# Patient Record
Sex: Female | Born: 1975
Health system: Southern US, Community
[De-identification: ages and names within clinical notes are randomized; demographics above are authoritative.]

## PROBLEM LIST (undated history)

## (undated) DIAGNOSIS — E739 Lactose intolerance, unspecified: Secondary | ICD-10-CM

## (undated) DIAGNOSIS — R51 Headache: Secondary | ICD-10-CM

## (undated) DIAGNOSIS — R87619 Unspecified abnormal cytological findings in specimens from cervix uteri: Secondary | ICD-10-CM

## (undated) DIAGNOSIS — M5430 Sciatica, unspecified side: Secondary | ICD-10-CM

## (undated) DIAGNOSIS — D219 Benign neoplasm of connective and other soft tissue, unspecified: Secondary | ICD-10-CM

## (undated) DIAGNOSIS — Z8669 Personal history of other diseases of the nervous system and sense organs: Secondary | ICD-10-CM

## (undated) DIAGNOSIS — R011 Cardiac murmur, unspecified: Secondary | ICD-10-CM

## (undated) DIAGNOSIS — IMO0002 Reserved for concepts with insufficient information to code with codable children: Secondary | ICD-10-CM

## (undated) DIAGNOSIS — K219 Gastro-esophageal reflux disease without esophagitis: Secondary | ICD-10-CM

## (undated) DIAGNOSIS — F419 Anxiety disorder, unspecified: Secondary | ICD-10-CM

## (undated) HISTORY — DX: Anxiety disorder, unspecified: F41.9

## (undated) HISTORY — PX: NO PAST SURGERIES: SHX2092

---
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
## 2009-12-25 ENCOUNTER — Inpatient Hospital Stay (HOSPITAL_COMMUNITY): Admission: AD | Admit: 2009-12-25 | Discharge: 2009-12-25 | Payer: Self-pay | Admitting: Family Medicine

## 2009-12-25 ENCOUNTER — Ambulatory Visit: Payer: Self-pay | Admitting: Advanced Practice Midwife

## 2009-12-25 IMAGING — US US OB COMP LESS 14 WK
1 series · 14 of 28 positions shown · non-contrast
Comparison: none

OBSTETRICAL ULTRASOUND:
 This ultrasound exam was performed in the [HOSPITAL] Ultrasound Department.  The OB US report was generated in the AS system, and faxed to the ordering physician.  This report is also available in [HOSPITAL]?s Access[employer]ware and in [REDACTED] PACS.

[Series 1: us ob comp less 14 wks · 58 acquisitions, 14 frames shown]
[im 3/58]
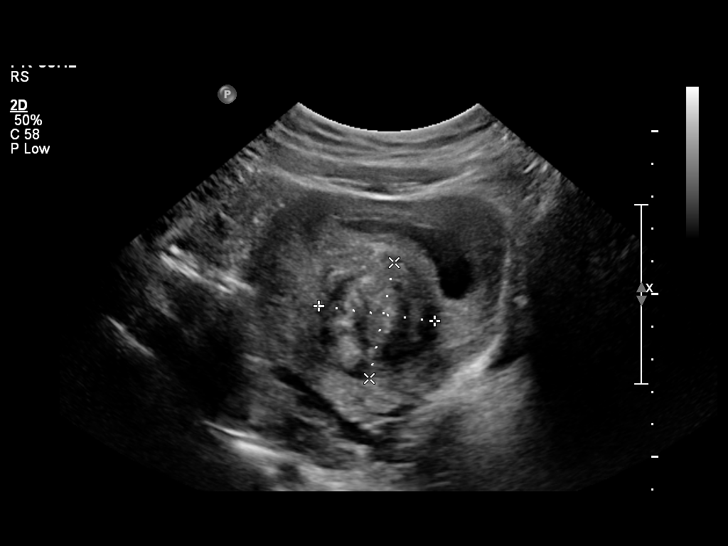
[im 7/58]
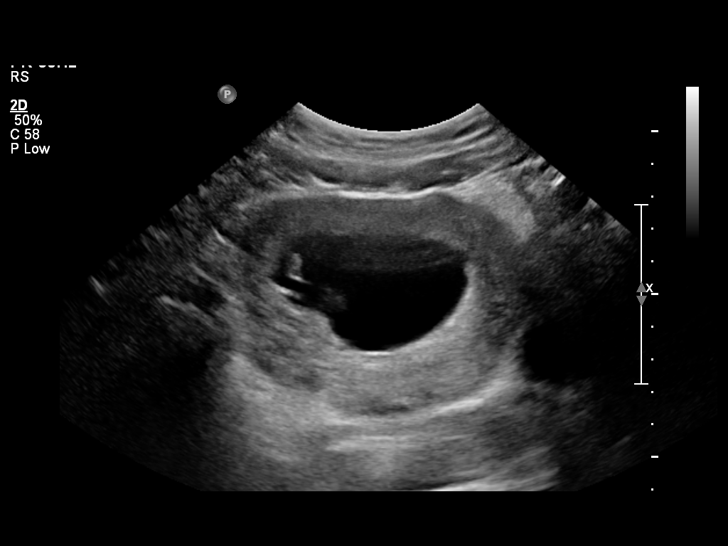
[im 11/58]
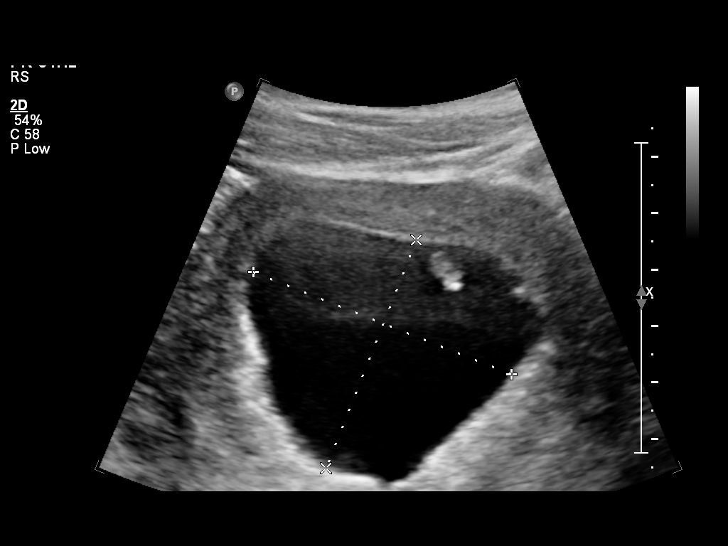
[im 15/58]
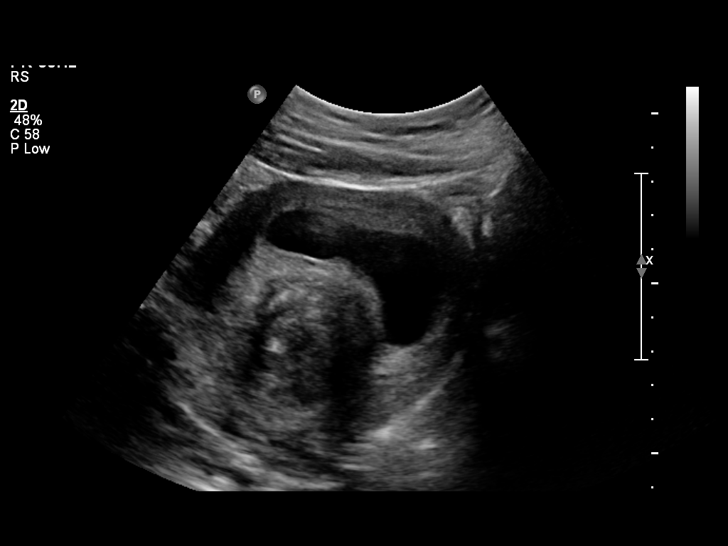
[im 20/58]
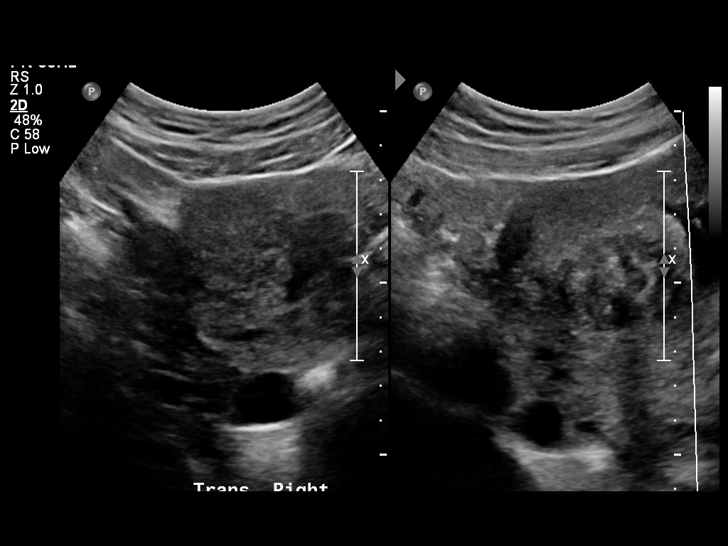
[im 24/58]
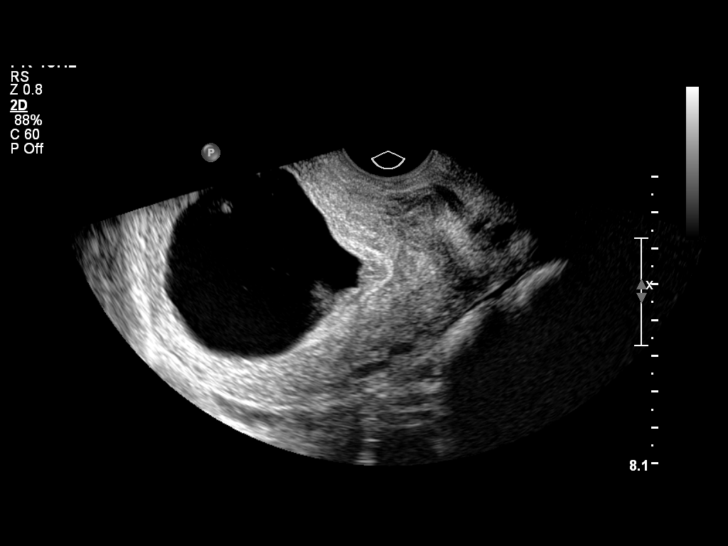
[im 28/58]
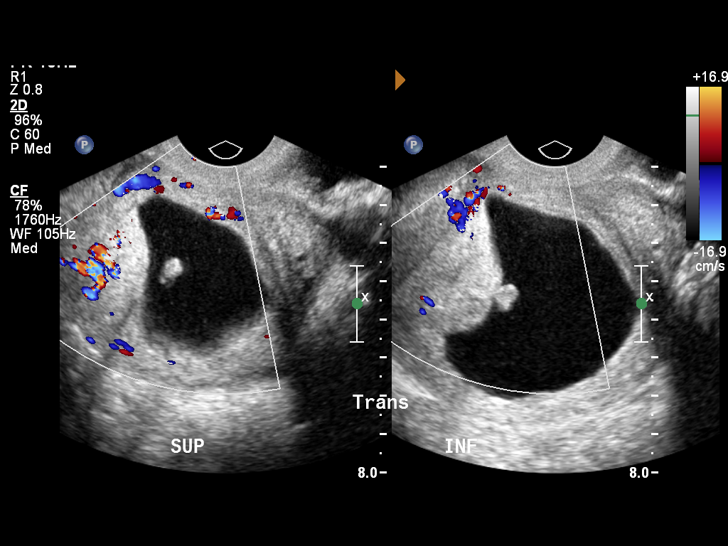
[im 32/58]
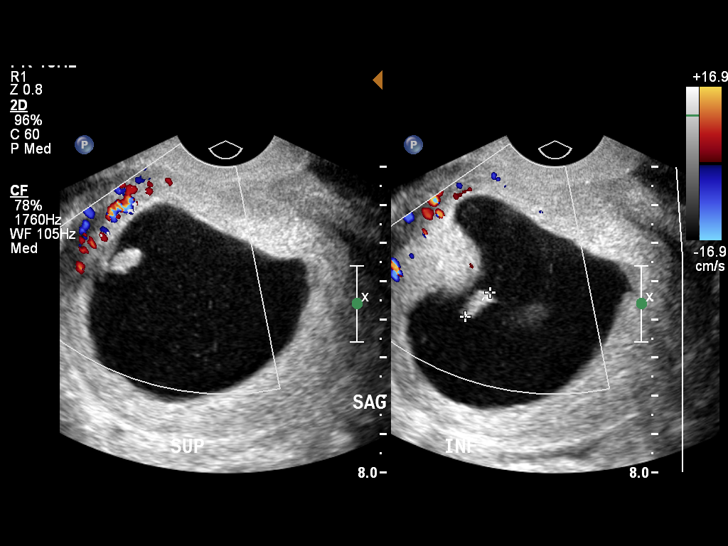
[im 36/58]
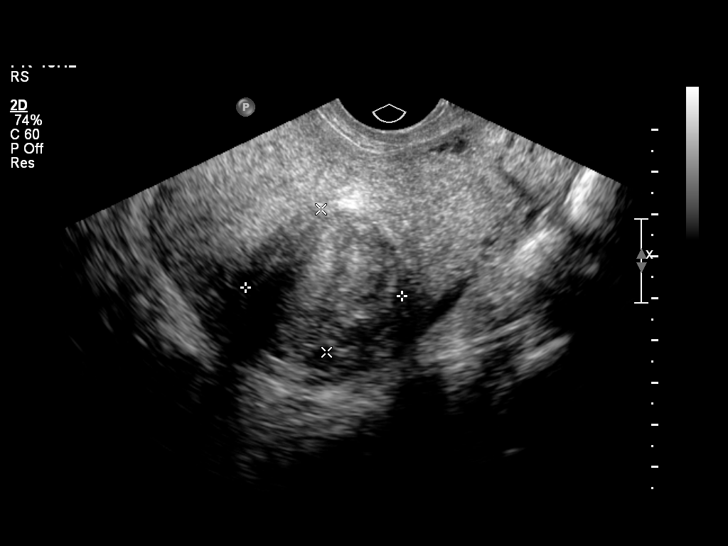
[im 41/58]
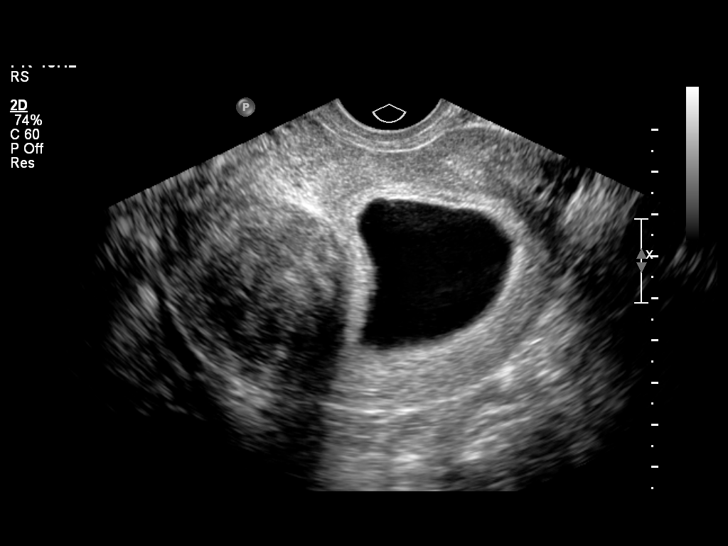
[im 45/58]
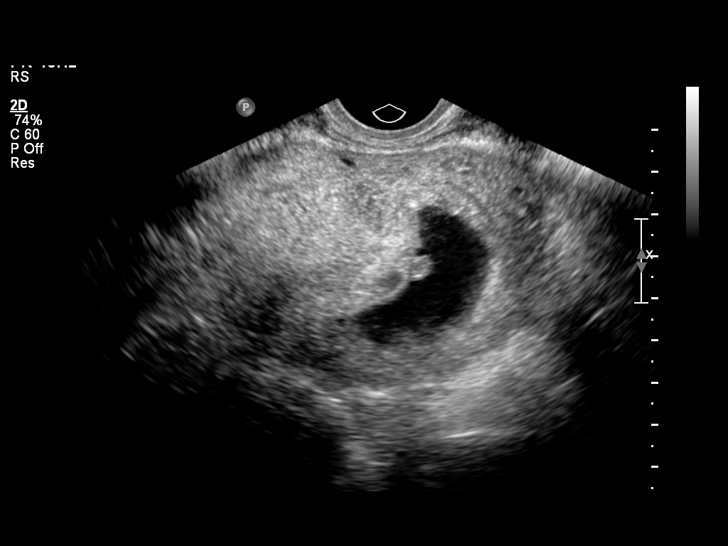
[im 49/58]
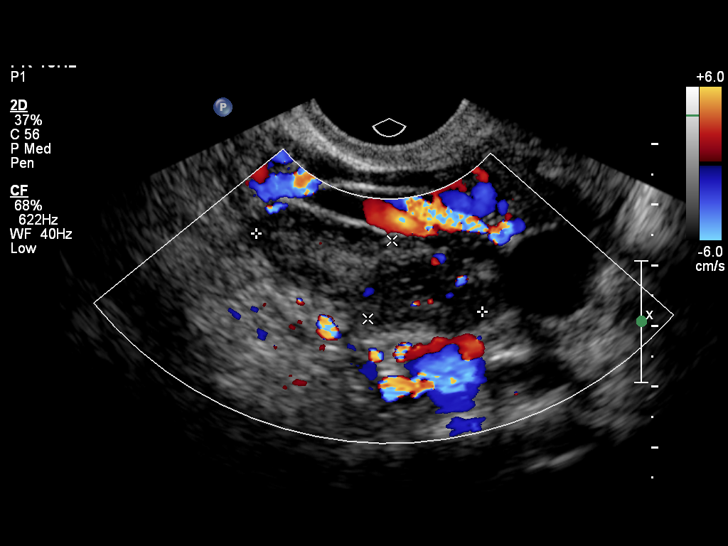
[im 53/58]
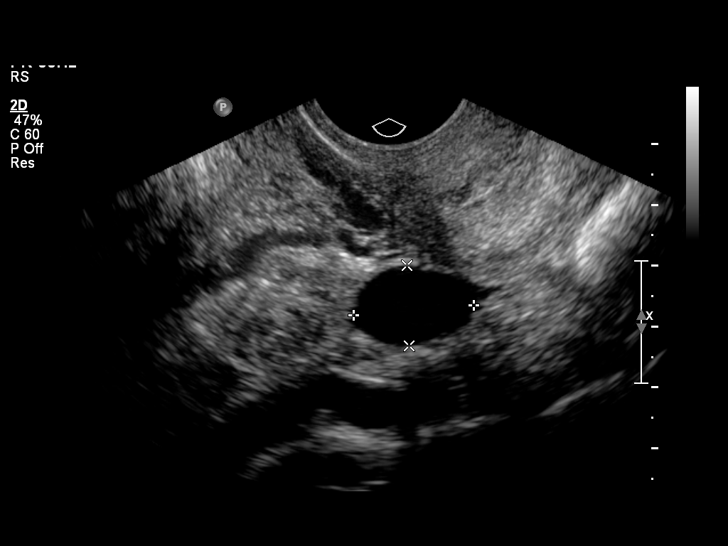
[im 58/58]
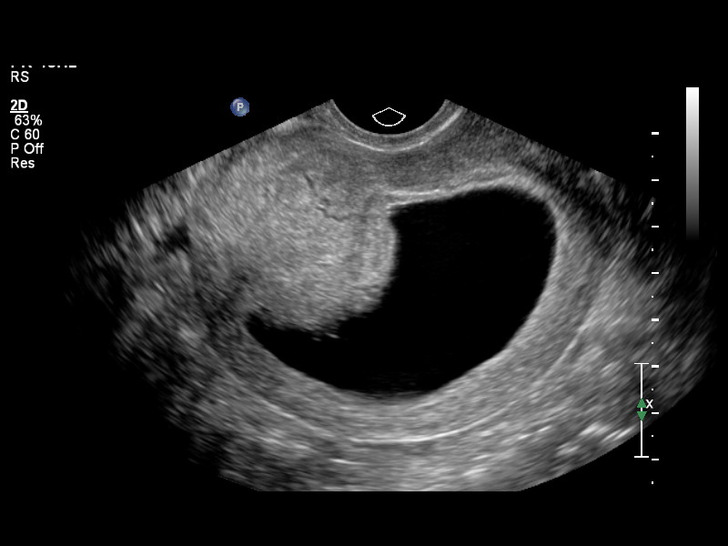

[14 of 28 positions shown; findings below may reference images not displayed]

IMPRESSION: See AS Obstetric US report.

## 2010-01-05 ENCOUNTER — Inpatient Hospital Stay (HOSPITAL_COMMUNITY): Admission: AD | Admit: 2010-01-05 | Discharge: 2010-01-05 | Payer: Self-pay | Admitting: Obstetrics & Gynecology

## 2010-01-05 IMAGING — US US OB TRANSVAGINAL
1 series · 14 of 28 positions shown · non-contrast
Comparison: none

OBSTETRICAL ULTRASOUND:
 This ultrasound exam was performed in the [HOSPITAL] Ultrasound Department.  The OB US report was generated in the AS system, and faxed to the ordering physician.  This report is also available in [HOSPITAL]?s [employer] and in [REDACTED] PACS.

[Series 1: us ob transvaginal · 14 of 33 slices shown]
[im 2/33]
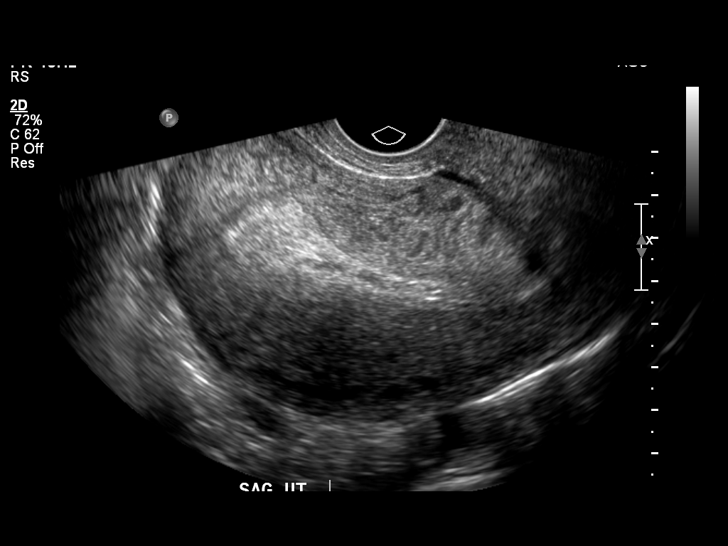
[im 4/33]
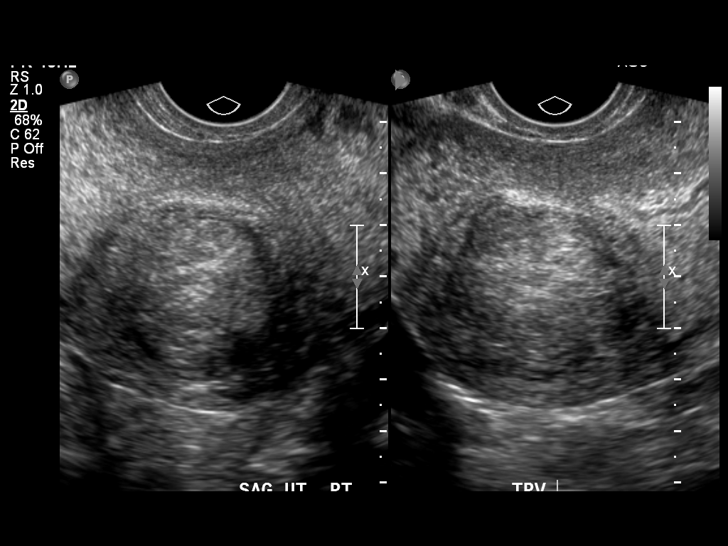
[im 6/33]
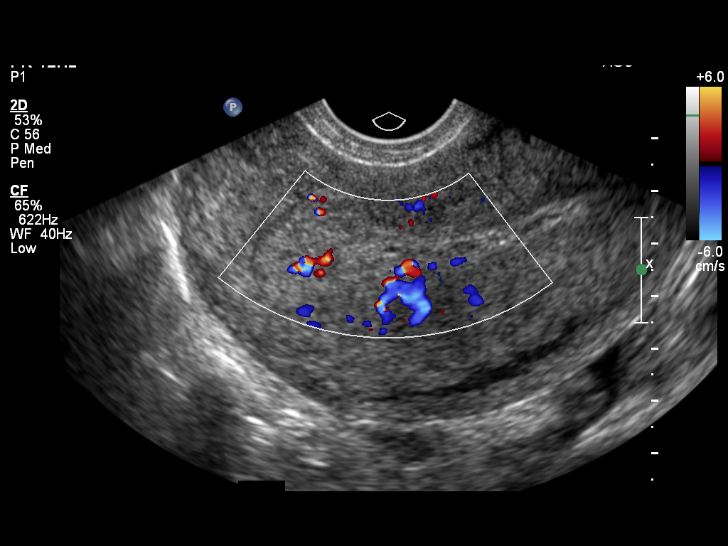
[im 9/33]
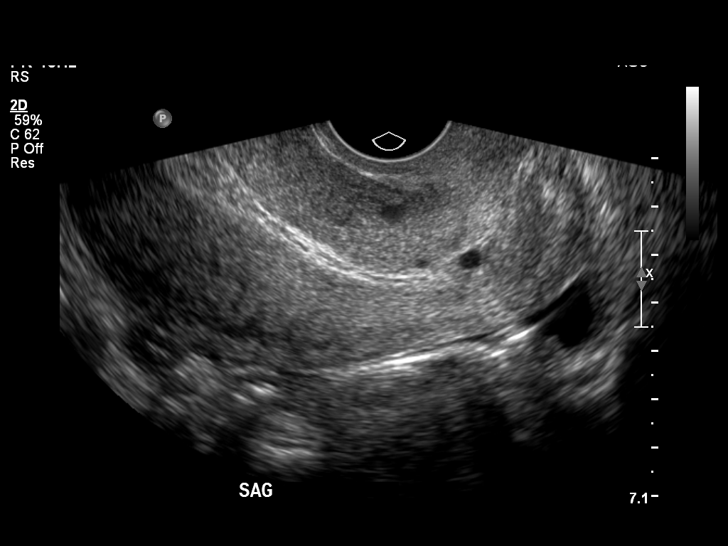
[im 11/33]
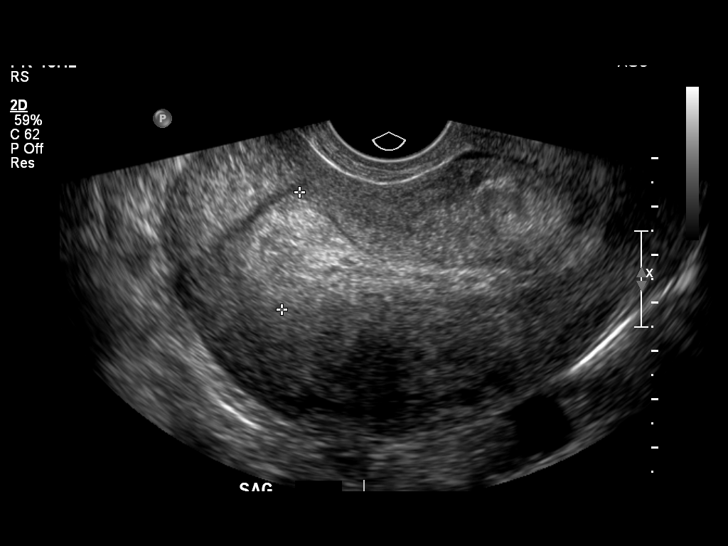
[im 14/33]
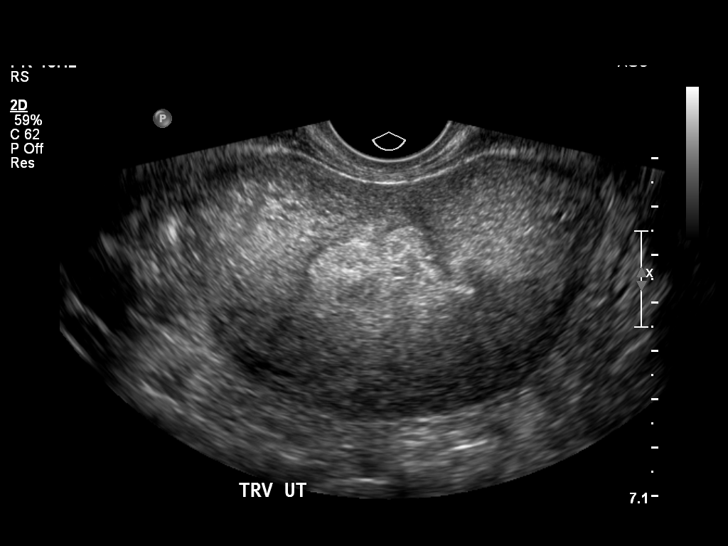
[im 16/33]
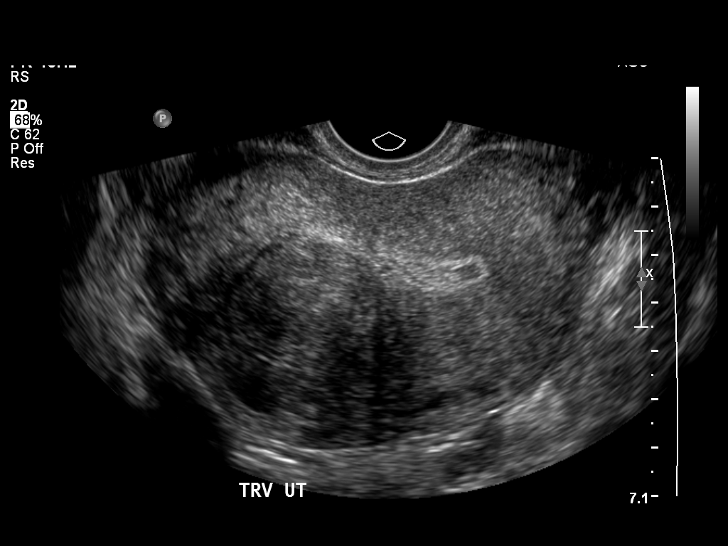
[im 18/33]
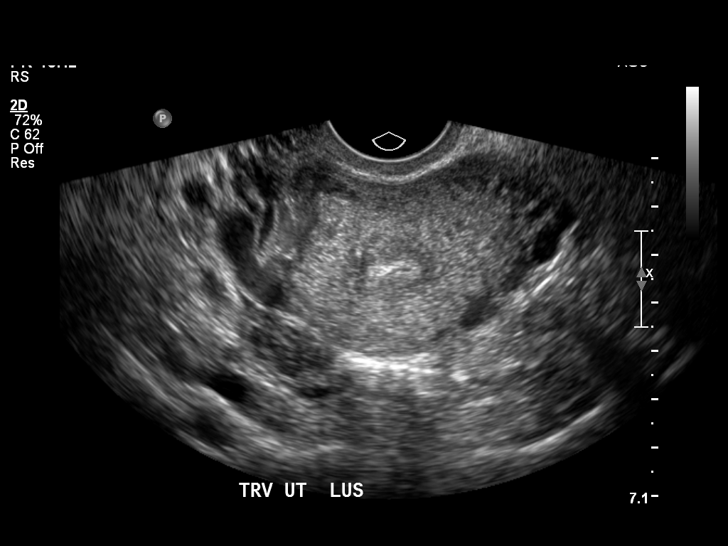
[im 21/33]
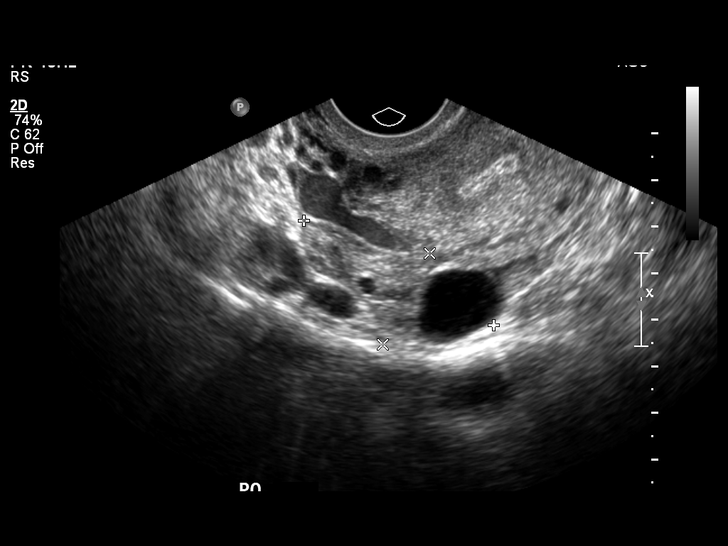
[im 23/33]
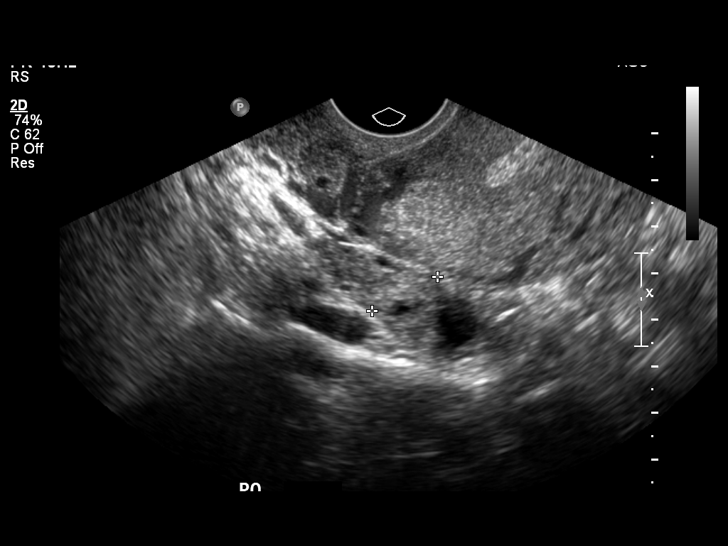
[im 25/33]
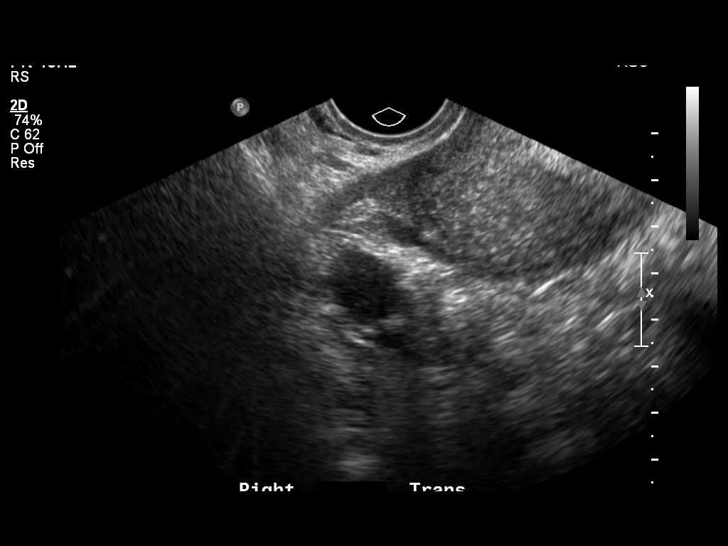
[im 28/33]
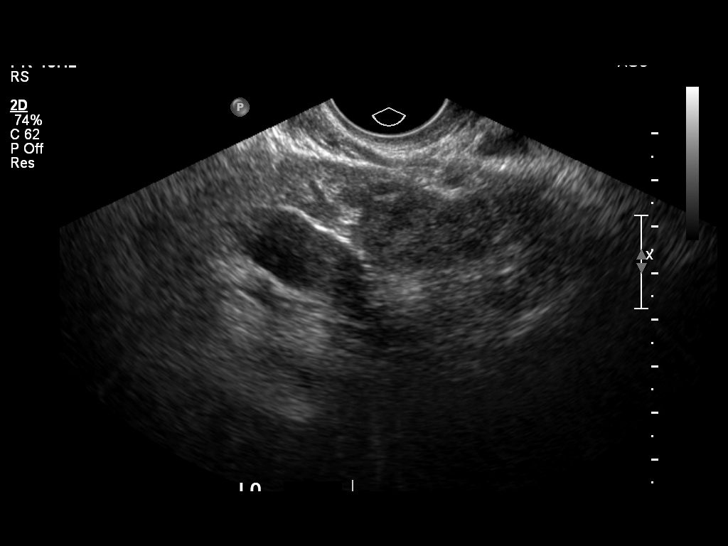
[im 30/33]
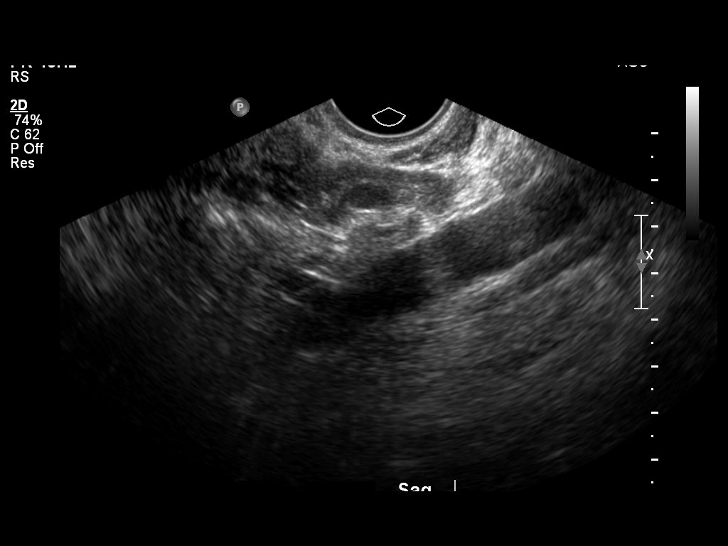
[im 33/33]
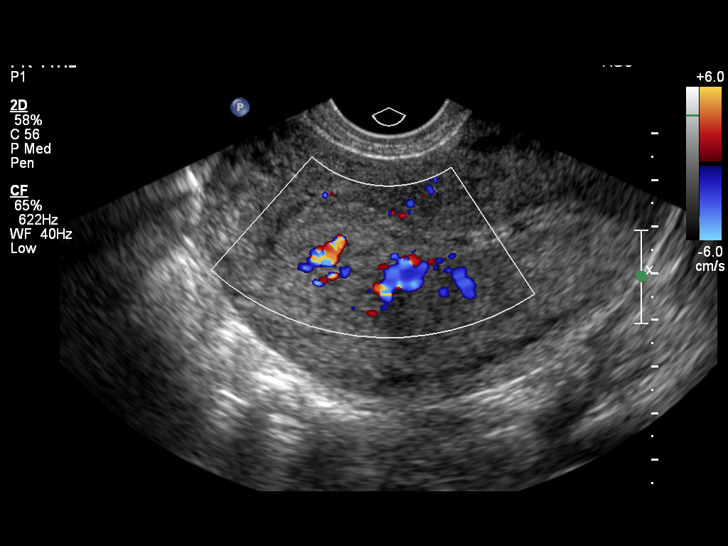

[14 of 28 positions shown; findings below may reference images not displayed]

IMPRESSION: See AS Obstetric US report.

## 2010-01-23 DEATH — deceased

## 2010-12-23 DIAGNOSIS — M5430 Sciatica, unspecified side: Secondary | ICD-10-CM

## 2010-12-23 DIAGNOSIS — D219 Benign neoplasm of connective and other soft tissue, unspecified: Secondary | ICD-10-CM

## 2010-12-23 HISTORY — DX: Sciatica, unspecified side: M54.30

## 2010-12-23 HISTORY — DX: Benign neoplasm of connective and other soft tissue, unspecified: D21.9

## 2011-03-10 LAB — URINALYSIS, ROUTINE W REFLEX MICROSCOPIC
Nitrite: NEGATIVE
Specific Gravity, Urine: <1.005 — ABNORMAL LOW (ref 1.005–1.030)
Urobilinogen, UA: 0.2 mg/dL (ref 0.0–1.0)
pH: 6.5 (ref 5.0–8.0)

## 2011-03-10 LAB — DIFFERENTIAL
Eosinophils Absolute: 0 10*3/uL (ref 0.0–0.7)
Lymphocytes Relative: 31 % (ref 12–46)
Lymphs Abs: 2.3 10*3/uL (ref 0.7–4.0)
Monocytes Relative: 9 % (ref 3–12)
Neutro Abs: 4.3 10*3/uL (ref 1.7–7.7)
Neutrophils Relative %: 59 % (ref 43–77)

## 2011-03-10 LAB — TYPE AND SCREEN: ABO/RH(D): A POS

## 2011-03-10 LAB — CBC
HCT: 40.2 % (ref 36.0–46.0)
HCT: 41 % (ref 36.0–46.0)
Hemoglobin: 13.1 g/dL (ref 12.0–15.0)
Hemoglobin: 13.4 g/dL (ref 12.0–15.0)
RBC: 4.73 MIL/uL (ref 3.87–5.11)
RBC: 4.79 MIL/uL (ref 3.87–5.11)
RDW: 12.9 % (ref 11.5–15.5)
RDW: 13.2 % (ref 11.5–15.5)
WBC: 7.2 10*3/uL (ref 4.0–10.5)

## 2011-03-10 LAB — WET PREP, GENITAL
Clue Cells Wet Prep HPF POC: NONE SEEN
Yeast Wet Prep HPF POC: NONE SEEN

## 2011-03-10 LAB — ABO/RH: ABO/RH(D): A POS

## 2011-03-10 LAB — HCG, QUANTITATIVE, PREGNANCY: hCG, Beta Chain, Quant, S: 5546 m[IU]/mL — ABNORMAL HIGH (ref <5)

## 2011-08-13 LAB — RPR: RPR: NONREACTIVE

## 2011-08-13 LAB — ABO/RH: RH Type: POSITIVE

## 2011-08-13 LAB — GC/CHLAMYDIA PROBE AMP, GENITAL: Chlamydia: NEGATIVE

## 2011-09-19 DIAGNOSIS — O209 Hemorrhage in early pregnancy, unspecified: Secondary | ICD-10-CM

## 2011-11-13 ENCOUNTER — Encounter (HOSPITAL_COMMUNITY): Payer: Self-pay | Admitting: *Deleted

## 2011-11-13 ENCOUNTER — Inpatient Hospital Stay (HOSPITAL_COMMUNITY): Payer: BC Managed Care – PPO

## 2011-11-13 ENCOUNTER — Inpatient Hospital Stay (HOSPITAL_COMMUNITY)
Admission: AD | Admit: 2011-11-13 | Discharge: 2011-11-13 | Disposition: A | Discharge status: Home / Self Care | Payer: BC Managed Care – PPO | Source: Ambulatory Visit | Attending: Obstetrics and Gynecology | Admitting: Obstetrics and Gynecology

## 2011-11-13 DIAGNOSIS — O209 Hemorrhage in early pregnancy, unspecified: Secondary | ICD-10-CM | POA: Insufficient documentation

## 2011-11-13 HISTORY — DX: Reserved for concepts with insufficient information to code with codable children: IMO0002

## 2011-11-13 HISTORY — DX: Personal history of other diseases of the nervous system and sense organs: Z86.69

## 2011-11-13 HISTORY — DX: Cardiac murmur, unspecified: R01.1

## 2011-11-13 HISTORY — DX: Sciatica, unspecified side: M54.30

## 2011-11-13 HISTORY — DX: Benign neoplasm of connective and other soft tissue, unspecified: D21.9

## 2011-11-13 HISTORY — DX: Unspecified abnormal cytological findings in specimens from cervix uteri: R87.619

## 2011-11-13 LAB — URINALYSIS, ROUTINE W REFLEX MICROSCOPIC
Glucose, UA: NEGATIVE mg/dL
Leukocytes, UA: NEGATIVE
Protein, ur: NEGATIVE mg/dL
Specific Gravity, Urine: 1.015 (ref 1.005–1.030)

## 2011-11-13 LAB — CBC
HCT: 34.3 % — ABNORMAL LOW (ref 36.0–46.0)
MCHC: 33.2 g/dL (ref 30.0–36.0)
MCV: 83.1 fL (ref 78.0–100.0)
Platelets: 215 10*3/uL (ref 150–400)
RDW: 14.1 % (ref 11.5–15.5)
WBC: 9.1 10*3/uL (ref 4.0–10.5)

## 2011-11-13 LAB — WET PREP, GENITAL: Clue Cells Wet Prep HPF POC: NONE SEEN

## 2011-11-13 LAB — URINE MICROSCOPIC-ADD ON

## 2011-11-13 NOTE — Progress Notes (Signed)
No active bleeding, brownish color, wet prep done Marissa Gonzalez discussing possible causes , this is 3rd episode in this pregnancy, no previa dx on 1st bleed

## 2011-11-13 NOTE — Progress Notes (Signed)
Pt  Voided this AM with blood on tissue and "some tissue" denies previa , to BR upon arrival and had only spotting

## 2011-11-13 NOTE — ED Provider Notes (Signed)
History   Pt presents today c/o vag spotting that began this morning. She states the bleeding was dark in color and she thought she saw something that resembled "tissue." She denies recent intercourse, bright red vag bleeding, fever, pain, or any other sx at this time. She reports GFM.  Chief Complaint  Patient presents with  . Vaginal Bleeding   HPI  OB History    Grav Para Term Preterm Abortions TAB SAB Ect Mult Living   1               Past Medical History  Diagnosis Date  . Heart murmur   . Abnormal Pap smear   . Hx of migraines     as teenager    Past Surgical History  Procedure Date  . Wisdom tooth extraction 1985  . Dental implant 1995    Family History  Problem Relation Age of Onset  . Cancer Maternal Grandmother   . Depression Mother     per prenatal  . Depression Father     per prenatal    History  Substance Use Topics  . Smoking status: Former Smoker    Quit date: 11/13/1999  . Smokeless tobacco: Never Used  . Alcohol Use: No    Allergies: No Known Allergies  Prescriptions prior to admission  Medication Sig Dispense Refill  . acetaminophen (TYLENOL) 500 MG tablet Take 1,000 mg by mouth every 6 (six) hours as needed. Takes for pain       . loratadine (CLARITIN) 10 MG tablet Take 10 mg by mouth daily.        . prenatal vitamin w/FE, FA (PRENATAL 1 + 1) 27-1 MG TABS Take 1 tablet by mouth daily.          Review of Systems  Constitutional: Negative for fever.  Cardiovascular: Negative for chest pain.  Gastrointestinal: Negative for nausea, vomiting, abdominal pain, diarrhea and constipation.  Genitourinary: Negative for dysuria, urgency, frequency and hematuria.  Neurological: Negative for dizziness and headaches.  Psychiatric/Behavioral: Negative for depression and suicidal ideas.   Physical Exam   Blood pressure 123/74, pulse 86, temperature 98.5 F (36.9 C), resp. rate 18, height 5\' 4"  (1.626 m), weight 177 lb (80.287 kg), SpO2  99.00%.  Physical Exam  Nursing note and vitals reviewed. Constitutional: She is oriented to person, place, and time. She appears well-developed and well-nourished. No distress.  HENT:  Head: Normocephalic and atraumatic.  Eyes: EOM are normal. Pupils are equal, round, and reactive to light.  GI: Soft. She exhibits no distension. There is no tenderness. There is no rebound and no guarding.  Genitourinary: There is bleeding around the vagina. Vaginal discharge found.       Cervix Lg/closed. Minimal amount of dark red blood in the vagina. Pt is not actively bleeding.  Neurological: She is alert and oriented to person, place, and time.  Skin: Skin is warm and dry. She is not diaphoretic.  Psychiatric: She has a normal mood and affect. Her behavior is normal. Judgment and thought content normal.    MAU Course  Procedures  Wet prep done.  Results for orders placed during the hospital encounter of 11/13/11 (from the past 24 hour(s))  CBC     Status: Abnormal   Collection Time   11/13/11  8:45 AM      Component Value Range   WBC 9.1  4.0 - 10.5 (K/uL)   RBC 4.13  3.87 - 5.11 (MIL/uL)   Hemoglobin 11.4 (*) 12.0 - 15.0 (  g/dL)   HCT 16.1 (*) 09.6 - 46.0 (%)   MCV 83.1  78.0 - 100.0 (fL)   MCH 27.6  26.0 - 34.0 (pg)   MCHC 33.2  30.0 - 36.0 (g/dL)   RDW 04.5  40.9 - 81.1 (%)   Platelets 215  150 - 400 (K/uL)  URINALYSIS, ROUTINE W REFLEX MICROSCOPIC     Status: Abnormal   Collection Time   11/13/11  8:50 AM      Component Value Range   Color, Urine YELLOW  YELLOW    Appearance CLEAR  CLEAR    Specific Gravity, Urine 1.015  1.005 - 1.030    pH 7.5  5.0 - 8.0    Glucose, UA NEGATIVE  NEGATIVE (mg/dL)   Hgb urine dipstick TRACE (*) NEGATIVE    Bilirubin Urine NEGATIVE  NEGATIVE    Ketones, ur NEGATIVE  NEGATIVE (mg/dL)   Protein, ur NEGATIVE  NEGATIVE (mg/dL)   Urobilinogen, UA 0.2  0.0 - 1.0 (mg/dL)   Nitrite NEGATIVE  NEGATIVE    Leukocytes, UA NEGATIVE  NEGATIVE   URINE  MICROSCOPIC-ADD ON     Status: Normal   Collection Time   11/13/11  8:50 AM      Component Value Range   Squamous Epithelial / LPF RARE  RARE    WBC, UA 0-2  <3 (WBC/hpf)   RBC / HPF 0-2  <3 (RBC/hpf)  WET PREP, GENITAL     Status: Abnormal   Collection Time   11/13/11  9:00 AM      Component Value Range   Yeast, Wet Prep NONE SEEN  NONE SEEN    Trich, Wet Prep NONE SEEN  NONE SEEN    Clue Cells, Wet Prep NONE SEEN  NONE SEEN    WBC, Wet Prep HPF POC FEW (*) NONE SEEN    US shows single IUP with good cardiac activity. No abruption or previa noted. NL cervical length. Large fibroid noted beneath placenta.  Discussed pt with Dr. Henderson Cloud at length. Will place pt on pelvic rest and modified bed rest. She has a f/u appt on 11/29.  Assessment and Plan  Bleeding in preg: discussed with pt at length. Discussed diet, activity, risks, and precautions. She is to avoid intercourse and will stay out of work until Monday of next week. She is to avoid prolonged standing. She will return immediately if she begins to have worsening sx.  Clinton Gallant. Jolly Bleicher III, DrHSc, MPAS, PA-C  11/13/2011, 10:01 AM   Henrietta Hoover, PA 11/13/11 1009

## 2012-02-21 ENCOUNTER — Encounter (HOSPITAL_COMMUNITY): Payer: Self-pay

## 2012-02-28 ENCOUNTER — Encounter (HOSPITAL_COMMUNITY): Payer: Self-pay

## 2012-03-09 ENCOUNTER — Encounter (HOSPITAL_COMMUNITY): Payer: Self-pay

## 2012-03-10 ENCOUNTER — Encounter (HOSPITAL_COMMUNITY): Payer: Self-pay

## 2012-03-10 ENCOUNTER — Encounter (HOSPITAL_COMMUNITY): Payer: Self-pay | Admitting: Anesthesiology

## 2012-03-10 ENCOUNTER — Encounter (HOSPITAL_COMMUNITY)
Admission: AD | Disposition: A | Discharge status: Home / Self Care | Payer: Self-pay | Source: Ambulatory Visit | Attending: Obstetrics and Gynecology

## 2012-03-10 ENCOUNTER — Inpatient Hospital Stay (HOSPITAL_COMMUNITY)
Admission: AD | Admit: 2012-03-10 | Discharge: 2012-03-13 | DRG: 371 | Disposition: A | Discharge status: Home / Self Care | Payer: BC Managed Care – PPO | Source: Ambulatory Visit | Attending: Obstetrics and Gynecology | Admitting: Obstetrics and Gynecology

## 2012-03-10 ENCOUNTER — Encounter (HOSPITAL_COMMUNITY): Payer: Self-pay | Admitting: *Deleted

## 2012-03-10 ENCOUNTER — Inpatient Hospital Stay (HOSPITAL_COMMUNITY): Payer: BC Managed Care – PPO | Admitting: Anesthesiology

## 2012-03-10 ENCOUNTER — Encounter (HOSPITAL_COMMUNITY)
Admission: RE | Admit: 2012-03-10 | Discharge: 2012-03-10 | Disposition: A | Discharge status: Home / Self Care | Payer: BC Managed Care – PPO | Source: Ambulatory Visit | Attending: Obstetrics and Gynecology | Admitting: Obstetrics and Gynecology

## 2012-03-10 DIAGNOSIS — O321XX0 Maternal care for breech presentation, not applicable or unspecified: Principal | ICD-10-CM | POA: Diagnosis present

## 2012-03-10 DIAGNOSIS — D250 Submucous leiomyoma of uterus: Secondary | ICD-10-CM | POA: Diagnosis present

## 2012-03-10 DIAGNOSIS — O34599 Maternal care for other abnormalities of gravid uterus, unspecified trimester: Secondary | ICD-10-CM | POA: Diagnosis present

## 2012-03-10 DIAGNOSIS — D4959 Neoplasm of unspecified behavior of other genitourinary organ: Secondary | ICD-10-CM | POA: Diagnosis present

## 2012-03-10 HISTORY — DX: Headache: R51

## 2012-03-10 HISTORY — DX: Lactose intolerance, unspecified: E73.9

## 2012-03-10 HISTORY — DX: Gastro-esophageal reflux disease without esophagitis: K21.9

## 2012-03-10 LAB — CBC
HCT: 37.9 % (ref 36.0–46.0)
HCT: 40.3 % (ref 36.0–46.0)
Hemoglobin: 12.4 g/dL (ref 12.0–15.0)
MCH: 28.2 pg (ref 26.0–34.0)
MCH: 28.5 pg (ref 26.0–34.0)
MCHC: 32.7 g/dL (ref 30.0–36.0)
MCV: 86.3 fL (ref 78.0–100.0)
MCV: 85.7 fL (ref 78.0–100.0)
Platelets: 232 10*3/uL (ref 150–400)
RDW: 14.2 % (ref 11.5–15.5)
RDW: 14.2 % (ref 11.5–15.5)
WBC: 9.7 10*3/uL (ref 4.0–10.5)

## 2012-03-10 LAB — SURGICAL PCR SCREEN: Staphylococcus aureus: NEGATIVE

## 2012-03-10 SURGERY — Surgical Case
Anesthesia: Regional | Site: Abdomen

## 2012-03-10 MED ORDER — KETOROLAC TROMETHAMINE 30 MG/ML IJ SOLN
30.0000 mg | Freq: Four times a day (QID) | INTRAMUSCULAR | Status: AC | PRN
  Administered 2012-03-10: 30 mg via INTRAVENOUS

## 2012-03-10 MED ORDER — METOCLOPRAMIDE HCL 5 MG/ML IJ SOLN: 10.0000 mg | Freq: Three times a day (TID) | INTRAMUSCULAR | Status: DC | PRN

## 2012-03-10 MED ORDER — SCOPOLAMINE 1 MG/3DAYS TD PT72
1.0000 | MEDICATED_PATCH | TRANSDERMAL | Status: DC
  Administered 2012-03-10: 1.5 mg via TRANSDERMAL

## 2012-03-10 MED ORDER — PHENYLEPHRINE HCL 10 MG/ML IJ SOLN
INTRAMUSCULAR | Status: DC | PRN
  Administered 2012-03-10: 160 ug via INTRAVENOUS
  Administered 2012-03-10: 80 ug via INTRAVENOUS
  Administered 2012-03-10: 40 ug via INTRAVENOUS
  Administered 2012-03-10 (×2): 120 ug via INTRAVENOUS
  Administered 2012-03-10: 80 ug via INTRAVENOUS

## 2012-03-10 MED ORDER — CITRIC ACID-SODIUM CITRATE 334-500 MG/5ML PO SOLN: 30.0000 mL | Freq: Once | ORAL | Status: DC

## 2012-03-10 MED ORDER — ONDANSETRON HCL 4 MG/2ML IJ SOLN: 4.0000 mg | INTRAMUSCULAR | Status: DC | PRN

## 2012-03-10 MED ORDER — IBUPROFEN 600 MG PO TABS
600.0000 mg | ORAL_TABLET | Freq: Four times a day (QID) | ORAL | Status: DC | PRN
  Filled 2012-03-10 (×4): qty 1
  Filled 2012-03-10: qty 2
  Filled 2012-03-10: qty 1
  Filled 2012-03-10: qty 2

## 2012-03-10 MED ORDER — BUPIVACAINE HCL (PF) 0.25 % IJ SOLN
INTRAMUSCULAR | Status: DC | PRN
  Administered 2012-03-10: 30 mL

## 2012-03-10 MED ORDER — BUPIVACAINE IN DEXTROSE 0.75-8.25 % IT SOLN
INTRATHECAL | Status: DC | PRN
  Administered 2012-03-10: 1.5 mL via INTRATHECAL

## 2012-03-10 MED ORDER — LACTATED RINGERS IV SOLN
INTRAVENOUS | Status: DC | PRN
  Administered 2012-03-10 (×3): via INTRAVENOUS

## 2012-03-10 MED ORDER — ONDANSETRON HCL 4 MG/2ML IJ SOLN
INTRAMUSCULAR | Status: DC | PRN
  Administered 2012-03-10: 4 mg via INTRAVENOUS

## 2012-03-10 MED ORDER — DIPHENHYDRAMINE HCL 25 MG PO CAPS
25.0000 mg | ORAL_CAPSULE | ORAL | Status: DC | PRN
  Filled 2012-03-10: qty 1

## 2012-03-10 MED ORDER — NALBUPHINE HCL 10 MG/ML IJ SOLN
5.0000 mg | INTRAMUSCULAR | Status: DC | PRN
  Filled 2012-03-10: qty 1

## 2012-03-10 MED ORDER — OXYTOCIN 10 UNIT/ML IJ SOLN
INTRAMUSCULAR | Status: DC | PRN
  Administered 2012-03-10: 20 [IU] via INTRAMUSCULAR

## 2012-03-10 MED ORDER — SENNOSIDES-DOCUSATE SODIUM 8.6-50 MG PO TABS
2.0000 | ORAL_TABLET | Freq: Every day | ORAL | Status: DC
  Administered 2012-03-11 – 2012-03-12 (×2): 2 via ORAL

## 2012-03-10 MED ORDER — OXYCODONE-ACETAMINOPHEN 5-325 MG PO TABS
1.0000 | ORAL_TABLET | ORAL | Status: DC | PRN
  Administered 2012-03-11 (×3): 1 via ORAL
  Administered 2012-03-12 – 2012-03-13 (×3): 2 via ORAL
  Filled 2012-03-10 (×3): qty 2
  Filled 2012-03-10 (×3): qty 1

## 2012-03-10 MED ORDER — FENTANYL CITRATE 0.05 MG/ML IJ SOLN
25.0000 ug | INTRAMUSCULAR | Status: DC | PRN
  Administered 2012-03-10 (×2): 50 ug via INTRAVENOUS

## 2012-03-10 MED ORDER — NALBUPHINE HCL 10 MG/ML IJ SOLN
5.0000 mg | INTRAMUSCULAR | Status: DC | PRN
  Administered 2012-03-10: 10 mg via INTRAVENOUS
  Filled 2012-03-10 (×2): qty 1

## 2012-03-10 MED ORDER — SIMETHICONE 80 MG PO CHEW
80.0000 mg | CHEWABLE_TABLET | Freq: Three times a day (TID) | ORAL | Status: DC
  Administered 2012-03-11 – 2012-03-13 (×9): 80 mg via ORAL

## 2012-03-10 MED ORDER — CEFAZOLIN SODIUM 1-5 GM-% IV SOLN
1.0000 g | Freq: Once | INTRAVENOUS | Status: AC
  Administered 2012-03-10: 1 g via INTRAVENOUS

## 2012-03-10 MED ORDER — IBUPROFEN 600 MG PO TABS
600.0000 mg | ORAL_TABLET | Freq: Four times a day (QID) | ORAL | Status: DC
  Administered 2012-03-10 – 2012-03-13 (×11): 600 mg via ORAL
  Filled 2012-03-10 (×2): qty 1

## 2012-03-10 MED ORDER — DIBUCAINE 1 % RE OINT: 1.0000 "application " | TOPICAL_OINTMENT | RECTAL | Status: DC | PRN

## 2012-03-10 MED ORDER — FENTANYL CITRATE 0.05 MG/ML IJ SOLN
INTRAMUSCULAR | Status: DC | PRN
  Administered 2012-03-10: 25 ug via INTRATHECAL

## 2012-03-10 MED ORDER — OXYTOCIN 20 UNITS IN LACTATED RINGERS INFUSION - SIMPLE
125.0000 mL/h | INTRAVENOUS | Status: AC
  Administered 2012-03-10: 125 mL/h via INTRAVENOUS

## 2012-03-10 MED ORDER — SIMETHICONE 80 MG PO CHEW: 80.0000 mg | CHEWABLE_TABLET | ORAL | Status: DC | PRN

## 2012-03-10 MED ORDER — EPHEDRINE SULFATE 50 MG/ML IJ SOLN
INTRAMUSCULAR | Status: DC | PRN
  Administered 2012-03-10: 10 mg via INTRAVENOUS
  Administered 2012-03-10: 5 mg via INTRAVENOUS
  Administered 2012-03-10: 15 mg via INTRAVENOUS
  Administered 2012-03-10: 20 mg via INTRAVENOUS

## 2012-03-10 MED ORDER — DIPHENHYDRAMINE HCL 50 MG/ML IJ SOLN
INTRAMUSCULAR | Status: AC
  Filled 2012-03-10: qty 1

## 2012-03-10 MED ORDER — LACTATED RINGERS IV SOLN
INTRAVENOUS | Status: DC
  Administered 2012-03-10: 23:00:00 via INTRAVENOUS

## 2012-03-10 MED ORDER — PRENATAL MULTIVITAMIN CH
1.0000 | ORAL_TABLET | Freq: Every day | ORAL | Status: DC
  Administered 2012-03-11 – 2012-03-13 (×3): 1 via ORAL
  Filled 2012-03-10 (×4): qty 1

## 2012-03-10 MED ORDER — SODIUM CHLORIDE 0.9 % IV SOLN
1.0000 ug/kg/h | INTRAVENOUS | Status: DC | PRN
  Filled 2012-03-10: qty 2.5

## 2012-03-10 MED ORDER — ZOLPIDEM TARTRATE 5 MG PO TABS: 5.0000 mg | ORAL_TABLET | Freq: Every evening | ORAL | Status: DC | PRN

## 2012-03-10 MED ORDER — MENTHOL 3 MG MT LOZG: 1.0000 | LOZENGE | OROMUCOSAL | Status: DC | PRN

## 2012-03-10 MED ORDER — NALOXONE HCL 0.4 MG/ML IJ SOLN: 0.4000 mg | INTRAMUSCULAR | Status: DC | PRN

## 2012-03-10 MED ORDER — MEPERIDINE HCL 25 MG/ML IJ SOLN: 6.2500 mg | INTRAMUSCULAR | Status: DC | PRN

## 2012-03-10 MED ORDER — MEDROXYPROGESTERONE ACETATE 150 MG/ML IM SUSP: 150.0000 mg | INTRAMUSCULAR | Status: DC | PRN

## 2012-03-10 MED ORDER — DIPHENHYDRAMINE HCL 50 MG/ML IJ SOLN
12.5000 mg | INTRAMUSCULAR | Status: DC | PRN
  Administered 2012-03-10: 12.5 mg via INTRAVENOUS

## 2012-03-10 MED ORDER — CITRIC ACID-SODIUM CITRATE 334-500 MG/5ML PO SOLN
ORAL | Status: AC
  Filled 2012-03-10: qty 15

## 2012-03-10 MED ORDER — DIPHENHYDRAMINE HCL 25 MG PO CAPS: 25.0000 mg | ORAL_CAPSULE | Freq: Four times a day (QID) | ORAL | Status: DC | PRN

## 2012-03-10 MED ORDER — WITCH HAZEL-GLYCERIN EX PADS: 1.0000 "application " | MEDICATED_PAD | CUTANEOUS | Status: DC | PRN

## 2012-03-10 MED ORDER — TETANUS-DIPHTH-ACELL PERTUSSIS 5-2.5-18.5 LF-MCG/0.5 IM SUSP: 0.5000 mL | Freq: Once | INTRAMUSCULAR | Status: DC

## 2012-03-10 MED ORDER — SCOPOLAMINE 1 MG/3DAYS TD PT72
MEDICATED_PATCH | TRANSDERMAL | Status: AC
  Filled 2012-03-10: qty 1

## 2012-03-10 MED ORDER — ONDANSETRON HCL 4 MG PO TABS: 4.0000 mg | ORAL_TABLET | ORAL | Status: DC | PRN

## 2012-03-10 MED ORDER — LACTATED RINGERS IV BOLUS (SEPSIS): 1000.0000 mL | Freq: Once | INTRAVENOUS | Status: DC

## 2012-03-10 MED ORDER — MEASLES, MUMPS & RUBELLA VAC ~~LOC~~ INJ
0.5000 mL | INJECTION | Freq: Once | SUBCUTANEOUS | Status: DC
  Filled 2012-03-10: qty 0.5

## 2012-03-10 MED ORDER — BISACODYL 10 MG RE SUPP: 10.0000 mg | Freq: Every day | RECTAL | Status: DC | PRN

## 2012-03-10 MED ORDER — ONDANSETRON HCL 4 MG/2ML IJ SOLN: 4.0000 mg | Freq: Three times a day (TID) | INTRAMUSCULAR | Status: DC | PRN

## 2012-03-10 MED ORDER — MORPHINE SULFATE (PF) 0.5 MG/ML IJ SOLN
INTRAMUSCULAR | Status: DC | PRN
  Administered 2012-03-10: .15 mg via INTRATHECAL

## 2012-03-10 MED ORDER — FLEET ENEMA 7-19 GM/118ML RE ENEM: 1.0000 | ENEMA | Freq: Every day | RECTAL | Status: DC | PRN

## 2012-03-10 MED ORDER — KETOROLAC TROMETHAMINE 30 MG/ML IJ SOLN: 30.0000 mg | Freq: Four times a day (QID) | INTRAMUSCULAR | Status: AC | PRN

## 2012-03-10 MED ORDER — DIPHENHYDRAMINE HCL 50 MG/ML IJ SOLN: 25.0000 mg | INTRAMUSCULAR | Status: DC | PRN

## 2012-03-10 MED ORDER — SODIUM CHLORIDE 0.9 % IJ SOLN: 3.0000 mL | INTRAMUSCULAR | Status: DC | PRN

## 2012-03-10 MED ORDER — LANOLIN HYDROUS EX OINT: 1.0000 "application " | TOPICAL_OINTMENT | CUTANEOUS | Status: DC | PRN

## 2012-03-10 SURGICAL SUPPLY — 29 items
BARRIER ADHS 3X4 INTERCEED (GAUZE/BANDAGES/DRESSINGS) IMPLANT
CHLORAPREP W/TINT 26ML (MISCELLANEOUS) ×2 IMPLANT
CLOTH BEACON ORANGE TIMEOUT ST (SAFETY) ×2 IMPLANT
CONTAINER PREFILL 10% NBF 15ML (MISCELLANEOUS) IMPLANT
DRESSING TELFA 8X3 (GAUZE/BANDAGES/DRESSINGS) ×2 IMPLANT
ELECT REM PT RETURN 9FT ADLT (ELECTROSURGICAL) ×2
ELECTRODE REM PT RTRN 9FT ADLT (ELECTROSURGICAL) ×1 IMPLANT
EXTRACTOR VACUUM M CUP 4 TUBE (SUCTIONS) IMPLANT
GLOVE BIO SURGEON STRL SZ 6.5 (GLOVE) ×4 IMPLANT
GOWN PREVENTION PLUS LG XLONG (DISPOSABLE) ×6 IMPLANT
KIT ABG SYR 3ML LUER SLIP (SYRINGE) IMPLANT
NEEDLE HYPO 22GX1.5 SAFETY (NEEDLE) ×2 IMPLANT
NEEDLE HYPO 25X5/8 SAFETYGLIDE (NEEDLE) ×2 IMPLANT
NS IRRIG 1000ML POUR BTL (IV SOLUTION) ×2 IMPLANT
PACK C SECTION WH (CUSTOM PROCEDURE TRAY) ×2 IMPLANT
PAD ABD 7.5X8 STRL (GAUZE/BANDAGES/DRESSINGS) ×2 IMPLANT
SLEEVE SCD COMPRESS KNEE MED (MISCELLANEOUS) ×2 IMPLANT
SPONGE GAUZE 4X4 12PLY (GAUZE/BANDAGES/DRESSINGS) ×2 IMPLANT
STAPLER VISISTAT 35W (STAPLE) IMPLANT
SUT CHROMIC 0 CTX 36 (SUTURE) ×4 IMPLANT
SUT PLAIN 0 NONE (SUTURE) IMPLANT
SUT PLAIN 2 0 XLH (SUTURE) IMPLANT
SUT VIC AB 0 CT1 27 (SUTURE) ×3
SUT VIC AB 0 CT1 27XBRD ANBCTR (SUTURE) ×3 IMPLANT
SUT VIC AB 4-0 KS 27 (SUTURE) IMPLANT
SYR CONTROL 10ML LL (SYRINGE) ×2 IMPLANT
TOWEL OR 17X24 6PK STRL BLUE (TOWEL DISPOSABLE) ×4 IMPLANT
TRAY FOLEY CATH 14FR (SET/KITS/TRAYS/PACK) ×2 IMPLANT
WATER STERILE IRR 1000ML POUR (IV SOLUTION) IMPLANT

## 2012-03-10 NOTE — H&P (Signed)
36 year old G 2 P 0010 at 39 weeks presents with SROM this afternoon. She is known breech presentation confirmed by ultrasound today.  NPO since 9 am Negative GBBS PNC see [facility] 10 cm fibroid Otherwise unremarkable  Afebrile Vital signs stable General alert and oriented Lung CTAB Car RRR Abdomen is soft and non tender Cervix  +SROM 1 cm dilated BREECH  IMPRESSION: SROM  39 weeks BREECH  PLAN: Primary LTCS Risks reviewed with the patient

## 2012-03-10 NOTE — Patient Instructions (Signed)
YOUR PROCEDURE IS SCHEDULED ON:03/11/12  ENTER THROUGH THE MAIN ENTRANCE OF [facility] AT:1130am USE DESK PHONE AND DIAL [phone] TO INFORM us OF YOUR ARRIVAL  CALL [phone] IF YOU HAVE ANY QUESTIONS OR PROBLEMS PRIOR TO YOUR ARRIVAL.  REMEMBER: DO NOT EAT AFTER MIDNIGHT :tonight  SPECIAL INSTRUCTIONS:clear liquids until 9am Wed   YOU MAY BRUSH YOUR TEETH THE MORNING OF SURGERY   TAKE THESE MEDICINES THE DAY OF SURGERY WITH SIP OF WATER:Zantac   DO NOT WEAR JEWELRY, EYE MAKEUP, LIPSTICK OR DARK FINGERNAIL POLISH DO NOT WEAR LOTIONS  DO NOT SHAVE FOR 48 HOURS PRIOR TO SURGERY  YOU WILL NOT BE ALLOWED TO DRIVE YOURSELF HOME.  NAME OF DRIVER:Andrew

## 2012-03-10 NOTE — Anesthesia Postprocedure Evaluation (Signed)
  Anesthesia Post-op Note  Patient: Marissa Gonzalez  Procedure(s) Performed: Procedure(s) (LRB): CESAREAN SECTION (N/A)  Patient Location: PACU  Anesthesia Type: Spinal  Level of Consciousness: awake, alert  and oriented  Airway and Oxygen Therapy: Patient Spontanous Breathing  Post-op Pain: none  Post-op Assessment: Post-op Vital signs reviewed  Post-op Vital Signs: Reviewed and stable  Complications: No apparent anesthesia complications

## 2012-03-10 NOTE — Consult Note (Signed)
Called to attend primary C/section for breech presentation at [redacted] wks EGA for 35 yo G2  P0 blood type A pos GBS negative mother after she presented with SROM at 0900 today with clear fluid.  Footling breech extraction.  Infant vigorous -  No resuscitation needed. Apgars 8/10. Left in OR for contact with mother, in care of CN staff, further care per Dr. Williams/[facility].  JWimmer,MD  

## 2012-03-10 NOTE — Anesthesia Procedure Notes (Signed)
Spinal  Patient location during procedure: OR Start time: 03/10/2012 4:31 PM Staffing Anesthesiologist: Danner Paulding A. Performed by: anesthesiologist  Preanesthetic Checklist Completed: patient identified, site marked, surgical consent, pre-op evaluation, timeout performed, IV checked, risks and benefits discussed and monitors and equipment checked Spinal Block Patient position: sitting Prep: site prepped and draped and DuraPrep Patient monitoring: heart rate, cardiac monitor, continuous pulse ox and blood pressure Approach: midline Location: L3-4 Injection technique: single-shot Needle Needle type: Sprotte  Needle gauge: 24 G Needle length: 9 cm Needle insertion depth: 5 cm Assessment Sensory level: T4 Additional Notes Patient tolerated procedure well. Adequate sensory level.

## 2012-03-10 NOTE — MAU Note (Signed)
pt water broke and breech presentation sent from office

## 2012-03-10 NOTE — Anesthesia Preprocedure Evaluation (Signed)
Anesthesia Evaluation  Patient identified by MRN, date of birth, ID band Patient awake    Reviewed: Allergy & Precautions, H&P , Patient's Chart, lab work & pertinent test results  History of Anesthesia Complications Negative for: history of anesthetic complications  Airway Mallampati: II TM Distance: >3 FB Neck ROM: Full    Dental No notable dental hx. (+) Teeth Intact   Pulmonary neg pulmonary ROS,  breath sounds clear to auscultation  Pulmonary exam normal       Cardiovascular + Valvular Problems/Murmurs Rhythm:Regular Rate:Normal  Hx/o heart murmur as a baby   Neuro/Psych negative neurological ROS  negative psych ROS   GI/Hepatic negative GI ROS, Neg liver ROS, GERD-  Medicated and Controlled,  Endo/Other  negative endocrine ROS  Renal/GU negative Renal ROS  negative genitourinary   Musculoskeletal   Abdominal Normal abdominal exam  (+)   Peds  Hematology negative hematology ROS (+)   Anesthesia Other Findings   Reproductive/Obstetrics (+) Pregnancy                           Anesthesia Physical Anesthesia Plan  ASA: II and Emergent  Anesthesia Plan: Spinal   Post-op Pain Management:    Induction:   Airway Management Planned:   Additional Equipment:   Intra-op Plan:   Post-operative Plan:   Informed Consent: I have reviewed the patients History and Physical, chart, labs and discussed the procedure including the risks, benefits and alternatives for the proposed anesthesia with the patient or authorized representative who has indicated his/her understanding and acceptance.   Dental Advisory Given  Plan Discussed with: Anesthesiologist, Surgeon and CRNA  Anesthesia Plan Comments:         Anesthesia Quick Evaluation

## 2012-03-10 NOTE — Brief Op Note (Signed)
03/10/2012  5:22 PM  PATIENT:  Marissa Gonzalez  36 y.o. female  PRE-OPERATIVE DIAGNOSIS:  Breech Presentation, SROM, IUP at 39 weeks POST-OPERATIVE DIAGNOSIS:  Same, Submucosal  Fibroid  PROCEDURE:  Procedure(s) (LRB): Primary Low Transverse CESAREAN SECTION (N/A)  SURGEON:  Surgeon(s) and Role:    * Jeani Hawking, MD - Primary  PHYSICIAN ASSISTANT:   ASSISTANTS: none   ANESTHESIA:   spinal  EBL:  Total I/O In: 2700 [I.V.:2700] Out: 950 [Urine:100; Blood:850]  BLOOD ADMINISTERED:none  DRAINS: Urinary Catheter (Foley)   LOCAL MEDICATIONS USED:  LIDOCAINE   SPECIMEN:  No Specimen  DISPOSITION OF SPECIMEN:  N/A  COUNTS:  YES  TOURNIQUET:  * No tourniquets in log *  DICTATION: .Other Dictation: Dictation Number [id number]  PLAN OF CARE: Admit to inpatient   PATIENT DISPOSITION:  PACU - hemodynamically stable.   Delay start of Pharmacological VTE agent (>24hrs) due to surgical blood loss or risk of bleeding: yes

## 2012-03-10 NOTE — Pre-Procedure Instructions (Signed)
Patient does not eat pork or beef. Also pt is lactose intolerant.

## 2012-03-10 NOTE — Transfer of Care (Signed)
Immediate Anesthesia Transfer of Care Note  Patient: Marissa Gonzalez  Procedure(s) Performed: Procedure(s) (LRB): CESAREAN SECTION (N/A)  Patient Location: PACU  Anesthesia Type: Spinal  Level of Consciousness: awake, alert  and oriented  Airway & Oxygen Therapy: Patient Spontanous Breathing  Post-op Assessment: Report given to PACU RN and Post -op Vital signs reviewed and stable  Post vital signs: stable  Complications: No apparent anesthesia complications

## 2012-03-11 ENCOUNTER — Inpatient Hospital Stay (HOSPITAL_COMMUNITY)
Admission: RE | Admit: 2012-03-11 | Payer: BC Managed Care – PPO | Source: Ambulatory Visit | Admitting: Obstetrics and Gynecology

## 2012-03-11 ENCOUNTER — Encounter (HOSPITAL_COMMUNITY): Admission: RE | Payer: Self-pay | Source: Ambulatory Visit

## 2012-03-11 ENCOUNTER — Encounter (HOSPITAL_COMMUNITY): Payer: Self-pay | Admitting: Obstetrics and Gynecology

## 2012-03-11 LAB — CBC
HCT: 28.7 % — ABNORMAL LOW (ref 36.0–46.0)
MCH: 28.4 pg (ref 26.0–34.0)
MCHC: 32.4 g/dL (ref 30.0–36.0)
MCV: 87.5 fL (ref 78.0–100.0)
RDW: 14.2 % (ref 11.5–15.5)

## 2012-03-11 SURGERY — Surgical Case
Anesthesia: Regional

## 2012-03-11 NOTE — Op Note (Signed)
NAMEKHARLIE, Marissa Gonzalez          ACCOUNT NO.:  [id number]  MEDICAL RECORD NO.:  [id number]  LOCATION:  9103                          FACILITY:  [facility]  PHYSICIAN:  Leonid Manus L. Takera Rayl, M.D.DATE OF BIRTH:  [birth year]  DATE OF PROCEDURE:  03/10/2012 DATE OF DISCHARGE:                              OPERATIVE REPORT   PREOPERATIVE DIAGNOSES: 1. Intrauterine pregnancy at 39 weeks. 2. Breech presentation 3. Spontaneous rupture of membranes.  POSTOP DIAGNOSIS: 1. Intrauterine pregnancy at 39 weeks. 2. Breech presentation 3. Spontaneous rupture of membranes. 4. Submucosal fibroid.  PROCEDURE:  Primary low-transverse cesarean section.  SURGEONS:  Ayoub Arey L. Madiline Saffran, MD  ANESTHESIA:  Spinal.  EBL:  500 mL.  COMPLICATIONS:  None.  PATHOLOGY:  None.  DRAINS:  Foley.  PROCEDURE:  The patient was taken to the operating room.  After informed consent was obtained, she was then prepped and draped after spinal was placed.  A low-transverse incision was made, carried down to the fascia. The fascia was scored in the midline and extended laterally.  The rectus muscles were separated in midline.  The peritoneum was entered bluntly and the peritoneal incision was then stretched.  The lower uterine segment was identified.  The bladder flap was created sharply and then digitally.  The bladder blade was inserted.  A low-transverse incision was made in the uterus.  Uterus was entered using a hemostat.  The baby was in double footling breech presentation and was delivered easily via complete breech extraction.  The baby was a female infant, Apgars 8 at 1 minute and 10 at 5 minutes.  Cord pH and cord blood was obtained. So waiting neonatologist was there and took care of the baby at the time of delivery.  After cord blood was obtained, the placenta was manually removed, noted to be normal intact with a 3-vessel cord.  The uterus was cleared of all clots and debris.  Upon palpation inside the  cavity and noted she had extremely large submucosal component of her fibroid arising from the right side of her uterus.  The uterus was very firm. Pitocin antibiotic had been given.  The uterine incision was closed in 1 layer using 0 chromic in a running locked stitch.  The uterus was inspected. The incision was hemostatic. Irrigation was performed.  The peritoneum was closed using 0 Vicryl in a running stitch.  The rectus muscles were reapproximated using 0 Vicryl.  The fascia was closed using 0 Vicryl in a running stitch.  After irrigation of subcutaneous layer, the skin was closed with staples.  Local was infiltrated.  A pressure dressing was applied. All sponge, lap, and instrument counts were correct x2.  The patient went to recovery room stable condition.     Kaire Stary L. Vincente Poli, M.D.     Florestine Avers  D:  03/10/2012  T:  03/11/2012  Job:  [id number]

## 2012-03-11 NOTE — Anesthesia Postprocedure Evaluation (Signed)
  Anesthesia Post-op Note  Patient: Marissa Gonzalez  Procedure(s) Performed: Procedure(s) (LRB): CESAREAN SECTION (N/A)  Patient Location: Mother/Baby  Anesthesia Type: Spinal  Level of Consciousness: awake, alert  and oriented  Airway and Oxygen Therapy: Patient Spontanous Breathing  Post-op Pain: none  Post-op Assessment: Post-op Vital signs reviewed, Patient's Cardiovascular Status Stable, No headache, No backache, No residual numbness and No residual motor weakness  Post-op Vital Signs: Reviewed and stable  Complications: No apparent anesthesia complications

## 2012-03-11 NOTE — Addendum Note (Signed)
Addendum  created 03/11/12 0901 by Shanon Payor, CRNA   Modules edited:Notes Section

## 2012-03-11 NOTE — Progress Notes (Signed)
Subjective: Postpartum Day 1: Cesarean Delivery Patient reports tolerating PO.    Objective: Vital signs in last 24 hours: Temp:  [97 F (36.1 C)-98.8 F (37.1 C)] 98.6 F (37 C) (03/20 0346) Pulse Rate:  [73-98] 90  (03/20 0346) Resp:  [10-28] 18  (03/20 0346) BP: (76-137)/(31-83) 109/68 mmHg (03/20 0346) SpO2:  [94 %-100 %] 96 % (03/20 0346) Weight:  [88.905 kg (196 lb)-90.719 kg (200 lb)] 88.905 kg (196 lb) (03/19 1451)  Physical Exam:  General: alert and cooperative Lochia: appropriate Uterine Fundus: firm Incision: abd dressing CDI Foley discontinued , has not voided at the time of rounding BS decreased DVT Evaluation: No evidence of DVT seen on physical exam.   Basename 03/11/12 0615 03/10/12 1513  HGB 9.3* 13.4  HCT 28.7* 40.3    Assessment/Plan: Status post Cesarean section. Doing well postoperatively.  Continue current care.  Conrad Zajkowski G 03/11/2012, 7:54 AM

## 2012-03-12 NOTE — Progress Notes (Signed)
Subjective: Postpartum Day 2: Cesarean Delivery Patient reports tolerating PO, + flatus and no problems voiding.    Objective: Vital signs in last 24 hours: Temp:  [97.9 F (36.6 C)-98.6 F (37 C)] 97.9 F (36.6 C) (03/21 0535) Pulse Rate:  [79-96] 79  (03/21 0535) Resp:  [18-20] 18  (03/21 0535) BP: (102-130)/(68-77) 120/74 mmHg (03/21 0535) SpO2:  [94 %-95 %] 95 % (03/20 1600)  Physical Exam:  General: alert and cooperative Lochia: appropriate Uterine Fundus: firm Incision: healing well, staples intact DVT Evaluation: No evidence of DVT seen on physical exam.   Basename 03/11/12 0615 03/10/12 1513  HGB 9.3* 13.4  HCT 28.7* 40.3    Assessment/Plan: Status post Cesarean section. Doing well postoperatively.  Continue current care.  Poppi Scantling G 03/12/2012, 7:57 AM

## 2012-03-13 ENCOUNTER — Encounter (HOSPITAL_COMMUNITY)
Admission: RE | Admit: 2012-03-13 | Discharge: 2012-03-13 | Disposition: A | Discharge status: Home / Self Care | Payer: BC Managed Care – PPO | Source: Ambulatory Visit | Attending: Obstetrics and Gynecology | Admitting: Obstetrics and Gynecology

## 2012-03-13 DIAGNOSIS — O923 Agalactia: Secondary | ICD-10-CM | POA: Insufficient documentation

## 2012-03-13 MED ORDER — IBUPROFEN 600 MG PO TABS: 600.0000 mg | ORAL_TABLET | Freq: Four times a day (QID) | ORAL | Status: AC | PRN

## 2012-03-13 MED ORDER — OXYCODONE-ACETAMINOPHEN 5-325 MG PO TABS: 1.0000 | ORAL_TABLET | ORAL | Status: AC | PRN

## 2012-03-13 NOTE — Discharge Summary (Signed)
Obstetric Discharge Summary Reason for Admission: rupture of membranes and breech presentation Prenatal Procedures: ultrasound Intrapartum Procedures: cesarean: low cervical, transverse Postpartum Procedures: none Complications-Operative and Postpartum: none Hemoglobin  Date Value Range Status  03/11/2012 9.3* 12.0-15.0 (g/dL) Final     REPEATED TO VERIFY     DELTA CHECK NOTED     REPEATED TO VERIFY     HCT  Date Value Range Status  03/11/2012 28.7* 36.0-46.0 (%) Final    Physical Exam:  General: alert and cooperative Lochia: appropriate Uterine Fundus: firm Incision: healing well, staples removed DVT Evaluation: No evidence of DVT seen on physical exam.  Discharge Diagnoses: Term Pregnancy-delivered  Discharge Information: Date: 03/13/2012 Activity: pelvic rest Diet: routine Medications: PNV, Ibuprofen and Percocet Condition: stable Instructions: refer to practice specific booklet Discharge to: home   Newborn Data: Live born female  Birth Weight: 6 lb 7.7 oz (2940 g) APGAR: 8, 10  Home with mother.  Verdia Bolt G 03/13/2012, 8:15 AM

## 2012-03-13 NOTE — Progress Notes (Signed)
Subjective: Postpartum Day 3: Cesarean Delivery Patient reports tolerating PO, + flatus and no problems voiding.    Objective: Vital signs in last 24 hours: Temp:  [97.4 F (36.3 C)-98 F (36.7 C)] 97.7 F (36.5 C) (03/22 0504) Pulse Rate:  [72-81] 72  (03/22 0504) Resp:  [18] 18  (03/22 0504) BP: (108-124)/(71-76) 108/71 mmHg (03/22 0504)  Physical Exam:  General: alert and cooperative Lochia: appropriate Uterine Fundus: firm Incision: healing well, staples removed,small ecchymosis DVT Evaluation: No evidence of DVT seen on physical exam.   Basename 03/11/12 0615 03/10/12 1513  HGB 9.3* 13.4  HCT 28.7* 40.3    Assessment/Plan: Status post Cesarean section. Doing well postoperatively.  Discharge home with standard precautions and return to clinic in 1week.  Mionna Advincula G 03/13/2012, 8:05 AM

## 2012-03-16 ENCOUNTER — Ambulatory Visit (HOSPITAL_COMMUNITY)
Admit: 2012-03-16 | Discharge: 2012-03-16 | Disposition: A | Discharge status: Home / Self Care | Payer: BC Managed Care – PPO | Attending: Obstetrics and Gynecology | Admitting: Obstetrics and Gynecology

## 2012-03-16 NOTE — Progress Notes (Signed)
Adult Lactation Consultation Outpatient Visit Note  Patient Name: Marissa Gonzalez Date of Birth: 03-28-1976 Gestational Age at Delivery: [redacted]w[redacted]d Type of Delivery:   Breastfeeding History: Frequency of Breastfeeding: q 2-3 hours   Length of Feeding: 30-45 Minutes Voids: qs Stools: qs  Supplementing / Method: Pumping:  Type of Pump:Symphony rental from [facility]   Frequency: 2 times per day on right breast  Volume: 15-30cc   Comments:    Consultation Evaluation:  Initial Feeding Assessment: Pre-feed Weight:5-15.3 oz/ 2704 g Post-feed Weight:5-15.9oz /2718g Amount Transferred:14 cc Comments: Baby nursed on right breast for 15 minutes. Using #16 NS due to Mom's flat nipples. Mom reports that baby has had a harder time with right breast and feels it is making much less milk than the left. Yellowish milk noted in NS. Mom states this is much better feeding than usual on this breast.  Additional Feeding Assessment: Pre-feed Weight:5-15.8oz/2718g Post-feed Weight:6-1.6 oz/2768 Amount Transferred:50 Comments: Baby nursed on left breast for 15 minutes with NS. Easily latched on and lots of swallows noted. Milk noted in NS after feeding  Additional Feeding Assessment: Pre-feed Weight: Post-feed Weight: Amount Transferred: Comments:  Total Breast milk Transferred this Visit: 64cc's Total Supplement Given: 0  Additional Interventions: Encouraged to begin some feedings on right breast ( Is always starting on the left). May want to pump the right side 2-3 times a day for extra stimulation to promote milk supply. No questions at present States the BF is going much better than when she left the hospital and she is glad to see baby is gaining weight.  Follow-Up Suggested Breast feeding Support Group on Tuesday mornings for support and can weigh baby there.     Pamelia Hoit 03/16/2012, 3:29 PM

## 2012-04-13 ENCOUNTER — Encounter (HOSPITAL_COMMUNITY)
Admission: RE | Admit: 2012-04-13 | Discharge: 2012-04-13 | Disposition: A | Discharge status: Home / Self Care | Payer: BC Managed Care – PPO | Source: Ambulatory Visit | Attending: Obstetrics and Gynecology | Admitting: Obstetrics and Gynecology

## 2012-04-13 DIAGNOSIS — O923 Agalactia: Secondary | ICD-10-CM | POA: Insufficient documentation

## 2012-04-29 ENCOUNTER — Ambulatory Visit (HOSPITAL_COMMUNITY)
Admission: RE | Admit: 2012-04-29 | Discharge: 2012-04-29 | Disposition: A | Discharge status: Home / Self Care | Payer: BC Managed Care – PPO | Source: Ambulatory Visit | Attending: Obstetrics and Gynecology | Admitting: Obstetrics and Gynecology

## 2012-04-29 NOTE — Progress Notes (Addendum)
Adult Lactation Consultation Outpatient Visit Note-SORE NIPPLES  Patient Name: Marissa Gonzalez  Infant: [name] Date of Birth: [birth year]                        DOB: [birth year] Gestational Age at Delivery:  39 weeks   Breastfeeding History: Frequency of Breastfeeding: every 3 hours Length of Feeding: about 20 minutes but could stay on 60 mins Voids: 8/24 hrs Stools: 1-2 yellow-green/24 hrs.    Thai in for OP appointment with her 75 week old baby [name].  She is complaining about painful, burning nipples, right one being cracked.  She has a history of using the nipple shield (flat nipples), but has discontinued using it about 2 weeks ago.  She said the crack occurred while using the nipple shield.  She also has a history of mastitis on the right breast.  She was on antibiotics, and her supply has always been minimal from the right breast.  In the hospital, baby had a >11% weight loss at discharge.  Birthweight was 6 lbs 7 oz,, losing down to 5 lbs. 11 oz.  Baby's weight up to 8 lbs. 5.7 oz today, which calculated is an increase of 44 oz in 47 days since her lowest weight.  That averages to almost 1 oz per day.    Watched doloras tellado in football hold.  [name] sandwiches breast very close to nipple, and baby unable to get deep enough latch.  Guided her hand to prevent her from interfering with baby's deep latch, and showed her how to wait for a wide gaping mouth.  Baby latched and fed on right side, vigorously but with intermittent chewing and swallows heard.  Demonstrated breast compression to increase positive pressure within the breast,  PC weight gain after 15 minutes was 11 ml.  [name] latched with better technique onto left breast.  Baby latched much more deeply and was vigorous with loud swallows.  Mom used breast compression, and baby fed for about 20 minutes before [name] took baby off after she fell asleep.  PC weight gain total for feeding was 45 ml from both  breasts. Talked to [name] about ways to increase milk intake (deep latching, breast compression, more frequent feedings during the day, pumping both breasts 1 hr after baby finishes feeding)  After talking it was decided to try to treat [name] for nipple yeast, both nipples very pink, and they burn.  With her history of antibiotic use recently, and nipple trauma, it was the next course of action.  Talked about [name] (handout given on protocol), and general Thrush treatment for Mom and baby given, and APNO (All purpose nipple ointment).  Talked about more frequent weight checks, every 2 weeks as baby is on the low end of growth percentage, but it so far following the curve at 5%.  [name] is active, vigorous, smiling, and cooing.  Reassurance given to [name] about being able to adequately produce enough milk from one breast as the right side is not producing.    To call us prn for any questions.     [name] 04/29/2012, 1:48 PM

## 2012-05-14 ENCOUNTER — Encounter (HOSPITAL_COMMUNITY)
Admission: RE | Admit: 2012-05-14 | Discharge: 2012-05-14 | Disposition: A | Discharge status: Home / Self Care | Payer: BC Managed Care – PPO | Source: Ambulatory Visit | Attending: Obstetrics and Gynecology | Admitting: Obstetrics and Gynecology

## 2012-05-14 DIAGNOSIS — O923 Agalactia: Secondary | ICD-10-CM | POA: Insufficient documentation

## 2013-12-23 NOTE — L&D Delivery Note (Signed)
Called by RN that fetus was delivering>>>nuchal cord X 2 noted by RN>>fetus to comfort room for now.  Placenta del by me intact>>>to path, no lacerations, fundus firm  Pt + husband consent to autopsy  She requests DC this evening if stable, will check PP CBC and DC after epidural criteria met

## 2014-02-25 LAB — OB RESULTS CONSOLE HEPATITIS B SURFACE ANTIGEN: HEP B S AG: NEGATIVE

## 2014-02-25 LAB — OB RESULTS CONSOLE ANTIBODY SCREEN: Antibody Screen: NEGATIVE

## 2014-02-25 LAB — OB RESULTS CONSOLE HIV ANTIBODY (ROUTINE TESTING): HIV: NONREACTIVE

## 2014-02-25 LAB — OB RESULTS CONSOLE RPR: RPR: NONREACTIVE

## 2014-02-25 LAB — OB RESULTS CONSOLE ABO/RH: RH Type: POSITIVE

## 2014-02-25 LAB — OB RESULTS CONSOLE RUBELLA ANTIBODY, IGM: RUBELLA: NON-IMMUNE/NOT IMMUNE

## 2014-02-25 LAB — OB RESULTS CONSOLE GBS: GBS: NEGATIVE

## 2014-06-03 ENCOUNTER — Encounter (HOSPITAL_COMMUNITY): Payer: Self-pay | Admitting: *Deleted

## 2014-06-03 ENCOUNTER — Observation Stay (HOSPITAL_COMMUNITY): Payer: BC Managed Care – PPO

## 2014-06-03 ENCOUNTER — Observation Stay (HOSPITAL_COMMUNITY): Payer: BC Managed Care – PPO | Admitting: Anesthesiology

## 2014-06-03 ENCOUNTER — Observation Stay (HOSPITAL_COMMUNITY)
Admission: AD | Admit: 2014-06-03 | Discharge: 2014-06-04 | Disposition: A | Discharge status: Home / Self Care | Payer: BC Managed Care – PPO | Source: Ambulatory Visit | Attending: Obstetrics and Gynecology | Admitting: Obstetrics and Gynecology

## 2014-06-03 ENCOUNTER — Encounter (HOSPITAL_COMMUNITY): Payer: BC Managed Care – PPO | Admitting: Anesthesiology

## 2014-06-03 DIAGNOSIS — O364XX0 Maternal care for intrauterine death, not applicable or unspecified: Secondary | ICD-10-CM | POA: Diagnosis not present

## 2014-06-03 DIAGNOSIS — IMO0002 Reserved for concepts with insufficient information to code with codable children: Secondary | ICD-10-CM | POA: Diagnosis present

## 2014-06-03 LAB — CBC
HEMATOCRIT: 37.1 % (ref 36.0–46.0)
Hemoglobin: 12.6 g/dL (ref 12.0–15.0)
MCH: 28.3 pg (ref 26.0–34.0)
MCHC: 34 g/dL (ref 30.0–36.0)
MCV: 83.2 fL (ref 78.0–100.0)
Platelets: 253 10*3/uL (ref 150–400)
RBC: 4.46 MIL/uL (ref 3.87–5.11)
RDW: 14.1 % (ref 11.5–15.5)
WBC: 9.2 10*3/uL (ref 4.0–10.5)

## 2014-06-03 LAB — TYPE AND SCREEN
ABO/RH(D): A POS
Antibody Screen: NEGATIVE

## 2014-06-03 LAB — RPR

## 2014-06-03 MED ORDER — EPHEDRINE 5 MG/ML INJ: 10.0000 mg | INTRAVENOUS | Status: DC | PRN

## 2014-06-03 MED ORDER — OXYCODONE-ACETAMINOPHEN 5-325 MG PO TABS
1.0000 | ORAL_TABLET | ORAL | Status: DC | PRN
  Administered 2014-06-03 – 2014-06-04 (×2): 1 via ORAL
  Filled 2014-06-03: qty 1

## 2014-06-03 MED ORDER — PHENYLEPHRINE 40 MCG/ML (10ML) SYRINGE FOR IV PUSH (FOR BLOOD PRESSURE SUPPORT): 80.0000 ug | PREFILLED_SYRINGE | INTRAVENOUS | Status: DC | PRN

## 2014-06-03 MED ORDER — ONDANSETRON HCL 4 MG/2ML IJ SOLN: 4.0000 mg | Freq: Four times a day (QID) | INTRAMUSCULAR | Status: DC | PRN

## 2014-06-03 MED ORDER — FENTANYL 2.5 MCG/ML BUPIVACAINE 1/10 % EPIDURAL INFUSION (WH - ANES)
INTRAMUSCULAR | Status: DC | PRN
  Administered 2014-06-03: 14 mL/h via EPIDURAL

## 2014-06-03 MED ORDER — LACTATED RINGERS IV SOLN: 500.0000 mL | Freq: Once | INTRAVENOUS | Status: DC

## 2014-06-03 MED ORDER — PHENYLEPHRINE 40 MCG/ML (10ML) SYRINGE FOR IV PUSH (FOR BLOOD PRESSURE SUPPORT)
80.0000 ug | PREFILLED_SYRINGE | INTRAVENOUS | Status: DC | PRN
Start: 2014-06-03 — End: 2014-06-03

## 2014-06-03 MED ORDER — TERBUTALINE SULFATE 1 MG/ML IJ SOLN: 0.2500 mg | Freq: Once | INTRAMUSCULAR | Status: AC | PRN

## 2014-06-03 MED ORDER — FENTANYL 2.5 MCG/ML BUPIVACAINE 1/10 % EPIDURAL INFUSION (WH - ANES)
14.0000 mL/h | INTRAMUSCULAR | Status: DC | PRN
  Administered 2014-06-03 – 2014-06-04 (×3): 14 mL/h via EPIDURAL
  Filled 2014-06-03 (×4): qty 125

## 2014-06-03 MED ORDER — FLEET ENEMA 7-19 GM/118ML RE ENEM: 1.0000 | ENEMA | RECTAL | Status: DC | PRN

## 2014-06-03 MED ORDER — FENTANYL 2.5 MCG/ML BUPIVACAINE 1/10 % EPIDURAL INFUSION (WH - ANES): 13.0000 mL/h | INTRAMUSCULAR | Status: DC | PRN

## 2014-06-03 MED ORDER — ACETAMINOPHEN 325 MG PO TABS: 650.0000 mg | ORAL_TABLET | ORAL | Status: DC | PRN

## 2014-06-03 MED ORDER — LIDOCAINE HCL (PF) 1 % IJ SOLN
INTRAMUSCULAR | Status: DC | PRN
  Administered 2014-06-03 (×2): 5 mL
  Administered 2014-06-03: 3 mL

## 2014-06-03 MED ORDER — OXYTOCIN 40 UNITS IN LACTATED RINGERS INFUSION - SIMPLE MED
62.5000 mL/h | INTRAVENOUS | Status: DC
  Filled 2014-06-03: qty 1000

## 2014-06-03 MED ORDER — LACTATED RINGERS IV SOLN
INTRAVENOUS | Status: DC
  Administered 2014-06-03 – 2014-06-04 (×4): via INTRAVENOUS

## 2014-06-03 MED ORDER — OXYCODONE-ACETAMINOPHEN 5-325 MG PO TABS
1.0000 | ORAL_TABLET | ORAL | Status: DC | PRN
  Filled 2014-06-03: qty 1

## 2014-06-03 MED ORDER — MISOPROSTOL 200 MCG PO TABS
200.0000 ug | ORAL_TABLET | ORAL | Status: DC
  Administered 2014-06-03 – 2014-06-04 (×4): 200 ug via VAGINAL
  Filled 2014-06-03 (×4): qty 1

## 2014-06-03 MED ORDER — IBUPROFEN 600 MG PO TABS
600.0000 mg | ORAL_TABLET | Freq: Four times a day (QID) | ORAL | Status: DC | PRN
  Administered 2014-06-04: 600 mg via ORAL
  Filled 2014-06-03: qty 1

## 2014-06-03 MED ORDER — EPHEDRINE 5 MG/ML INJ
10.0000 mg | INTRAVENOUS | Status: DC | PRN
  Filled 2014-06-03: qty 4

## 2014-06-03 MED ORDER — LACTATED RINGERS IV SOLN: 500.0000 mL | INTRAVENOUS | Status: DC | PRN

## 2014-06-03 MED ORDER — DIPHENHYDRAMINE HCL 50 MG/ML IJ SOLN
12.5000 mg | INTRAMUSCULAR | Status: AC | PRN
  Administered 2014-06-04 (×3): 12.5 mg via INTRAVENOUS
  Filled 2014-06-03 (×2): qty 1

## 2014-06-03 MED ORDER — OXYTOCIN BOLUS FROM INFUSION
500.0000 mL | INTRAVENOUS | Status: DC
  Administered 2014-06-04: 500 mL via INTRAVENOUS

## 2014-06-03 MED ORDER — CITRIC ACID-SODIUM CITRATE 334-500 MG/5ML PO SOLN: 30.0000 mL | ORAL | Status: DC | PRN

## 2014-06-03 MED ORDER — DIPHENHYDRAMINE HCL 50 MG/ML IJ SOLN: 12.5000 mg | INTRAMUSCULAR | Status: DC | PRN

## 2014-06-03 MED ORDER — PHENYLEPHRINE 40 MCG/ML (10ML) SYRINGE FOR IV PUSH (FOR BLOOD PRESSURE SUPPORT)
80.0000 ug | PREFILLED_SYRINGE | INTRAVENOUS | Status: DC | PRN
  Filled 2014-06-03: qty 10

## 2014-06-03 MED ORDER — LIDOCAINE HCL (PF) 1 % IJ SOLN: 30.0000 mL | INTRAMUSCULAR | Status: DC | PRN

## 2014-06-03 NOTE — Progress Notes (Signed)
Pt has had repeat ultrasound to confirm fetal demise at her request. Hospital chaplain has been at bedside to offer patient comfort services. Pt is ready to proceed with cytotec induction at this time.

## 2014-06-03 NOTE — H&P (Signed)
SEPHORA BOYAR  DICTATION # [id number] CSN# [id number]   Margarette Asal, MD 06/03/2014 1:54 PM

## 2014-06-03 NOTE — Anesthesia Procedure Notes (Signed)
Epidural Patient location during procedure: OB  Staffing Anesthesiologist: Montez Hageman Performed by: anesthesiologist   Preanesthetic Checklist Completed: patient identified, site marked, surgical consent, pre-op evaluation, timeout performed, IV checked, risks and benefits discussed and monitors and equipment checked  Epidural Patient position: sitting Prep: ChloraPrep Patient monitoring: heart rate, continuous pulse ox and blood pressure Approach: right paramedian Location: L3-L4 Injection technique: LOR saline  Needle:  Needle type: Tuohy  Needle gauge: 17 G Needle length: 9 cm and 9 Needle insertion depth: 6 cm Catheter type: closed end flexible Catheter size: 20 Guage Catheter at skin depth: 10 cm Test dose: negative  Assessment Events: blood not aspirated, injection not painful, no injection resistance, negative IV test and no paresthesia  Additional Notes   Patient tolerated the insertion well without complications.

## 2014-06-03 NOTE — Anesthesia Preprocedure Evaluation (Signed)
Anesthesia Evaluation  Patient identified by MRN, date of birth, ID band Patient awake    Reviewed: Allergy & Precautions, H&P , NPO status , Patient's Chart, lab work & pertinent test results  History of Anesthesia Complications Negative for: history of anesthetic complications  Airway Mallampati: II TM Distance: >3 FB Neck ROM: full    Dental no notable dental hx. (+) Teeth Intact   Pulmonary neg pulmonary ROS, former smoker,  breath sounds clear to auscultation  Pulmonary exam normal       Cardiovascular negative cardio ROS  Rhythm:regular Rate:Normal     Neuro/Psych negative neurological ROS  negative psych ROS   GI/Hepatic negative GI ROS, Neg liver ROS,   Endo/Other  negative endocrine ROS  Renal/GU negative Renal ROS  negative genitourinary   Musculoskeletal   Abdominal Normal abdominal exam  (+)   Peds  Hematology negative hematology ROS (+)   Anesthesia Other Findings   Reproductive/Obstetrics (+) Pregnancy IUFD                           Anesthesia Physical Anesthesia Plan  ASA: II  Anesthesia Plan: Epidural   Post-op Pain Management:    Induction:   Airway Management Planned:   Additional Equipment:   Intra-op Plan:   Post-operative Plan:   Informed Consent: I have reviewed the patients History and Physical, chart, labs and discussed the procedure including the risks, benefits and alternatives for the proposed anesthesia with the patient or authorized representative who has indicated his/her understanding and acceptance.     Plan Discussed with:   Anesthesia Plan Comments:         Anesthesia Quick Evaluation

## 2014-06-03 NOTE — Progress Notes (Signed)
06/03/14 1700  Clinical Encounter Type  Visited With Patient and family together (husband)  Visit Type (IUFD - bereavement support)  Referral From Nurse Daralene Milch, RN)  Spiritual Encounters  Spiritual Needs Grief support;Emotional  Stress Factors  Patient Stress Factors Loss;Loss of control;Major life changes (IUFD@21w ; "most stressful week at work"; family away; shingl)   Marissa Gonzalez and her husband were processing their emotional devastation (their language) and the surreal feeling that comes with the disorientation of shocking grief.  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  They are members of [employer] and note that they have no specific needs regarding L&D and care of baby as relates to their [place].  Spouses appear to be supporting each other well.  Their families are out of town (his, not local; hers, on vacation), so immediate support is limited.  They had a miscarriage prior to birth of their daughter Aldona Bar, 2, whose care and coping is a stressor; they've "been working so hard to get her ready to be a big sister--books, t-shirts, lots of talks," and now they have to help her through the bewilderment of grief instead.  Reassured them that [facility] is available throughout their experience for extra support.  Provided spiritual and emotional care, reflective listening, and early bereavement support.  Please page 24/7 as further needs arise:  [phone].  Thank you.  [place], [place]

## 2014-06-03 NOTE — Progress Notes (Signed)
Met with pt/husband, they request  F/u US  First.  Did discuss hysterotomy/RCS vs lower dose cytotec labor induction>both procedures/risks discussed>>they prefer option for vag delivery.  Has prob early shingles outbreak on back>>iso precautions noted.  Discussed SB eval:1.placental path  2.  Karyotype(even though NIPT normal) and 3. Poss autopsy, they will consider latter.  Will start Cytotec 251mg after confirmation [place]

## 2014-06-04 ENCOUNTER — Encounter (HOSPITAL_COMMUNITY): Payer: Self-pay | Admitting: *Deleted

## 2014-06-04 DIAGNOSIS — O364XX0 Maternal care for intrauterine death, not applicable or unspecified: Secondary | ICD-10-CM | POA: Diagnosis not present

## 2014-06-04 LAB — CBC
HCT: 37.8 % (ref 36.0–46.0)
Hemoglobin: 12.8 g/dL (ref 12.0–15.0)
MCH: 28.1 pg (ref 26.0–34.0)
MCHC: 33.9 g/dL (ref 30.0–36.0)
MCV: 82.9 fL (ref 78.0–100.0)
PLATELETS: 203 10*3/uL (ref 150–400)
RBC: 4.56 MIL/uL (ref 3.87–5.11)
RDW: 14 % (ref 11.5–15.5)
WBC: 14.3 10*3/uL — ABNORMAL HIGH (ref 4.0–10.5)

## 2014-06-04 MED ORDER — MISOPROSTOL 200 MCG PO TABS
400.0000 ug | ORAL_TABLET | Freq: Four times a day (QID) | ORAL | Status: DC
  Administered 2014-06-04: 400 ug via VAGINAL

## 2014-06-04 MED ORDER — VALACYCLOVIR HCL 1 G PO TABS: 1000.0000 mg | ORAL_TABLET | Freq: Three times a day (TID) | ORAL | Status: DC

## 2014-06-04 MED ORDER — VALACYCLOVIR HCL 500 MG PO TABS
1000.0000 mg | ORAL_TABLET | Freq: Three times a day (TID) | ORAL | Status: DC
  Filled 2014-06-04 (×4): qty 2

## 2014-06-04 MED ORDER — MISOPROSTOL 200 MCG PO TABS
400.0000 ug | ORAL_TABLET | Freq: Four times a day (QID) | ORAL | Status: DC
  Filled 2014-06-04: qty 2

## 2014-06-04 MED ORDER — IBUPROFEN 600 MG PO TABS: 600.0000 mg | ORAL_TABLET | Freq: Four times a day (QID) | ORAL | Status: DC | PRN

## 2014-06-04 MED ORDER — MISOPROSTOL 200 MCG PO TABS
400.0000 ug | ORAL_TABLET | ORAL | Status: DC
  Administered 2014-06-04: 400 ug via VAGINAL
  Filled 2014-06-04: qty 2

## 2014-06-04 MED ORDER — OXYCODONE-ACETAMINOPHEN 5-325 MG PO TABS: 1.0000 | ORAL_TABLET | ORAL | Status: DC | PRN

## 2014-06-04 NOTE — Progress Notes (Addendum)
Visit as a follow up to earlier spiritual care. Ms Marissa Gonzalez was in delivery when chaplain visited. Nurses requested to page chaplain after delivery as needed. Ms Marissa Gonzalez returned to her room, but nurses requested that the chaplain not follow up with her at this time.  Sallee Lange. Tajuanna Burnett, D.Min. Chaplain

## 2014-06-04 NOTE — Discharge Instructions (Signed)
Pt discharge to home, discharge instructions given on medications, diet, activity, and follow-up appointment.  Pt verbalizes understanding and has no questions at this time.  Pt was given information on grief counseling resources and support groups.  Pt and spouse acknowledged information, spouse states he will f/u with his Rabbi once home.  Pt left unit via wheelchair by RN escort.   Marquita Palms, RN 06/04/14 @ [phone]

## 2014-06-04 NOTE — Progress Notes (Signed)
Discussed w/ genetics lab at [facility]>>best source for fetal karyotype is pre delivery AF>>>discussed w/ pt, consented for amnio.  She has epid>>US to localize AF pocket, 1 pass lighjt blood tinged free flowing AF>>>sent for karyotype  Also,. Pt already started on Valtrex 1000 mg PO TID for presumed zoster, lesions NON vesicular and crusted over

## 2014-06-04 NOTE — Discharge Summary (Signed)
Obstetric Discharge Summary Reason for Admission: induction of labor Prenatal Procedures: amniocentesis, ultrasound and Panorama Intrapartum Procedures: spontaneous vaginal delivery, IUFD, 22 weeks Postpartum Procedures: none Complications-Operative and Postpartum: IUFD, pre partum amniocentesis for karyotype sent Hemoglobin  Date Value Ref Range Status  06/03/2014 12.6  12.0 - 15.0 g/dL Final     HCT  Date Value Ref Range Status  06/03/2014 37.1  36.0 - 46.0 % Final    Physical Exam:  General: alert Lochia: appropriate Uterine Fundus: firm Incision: healing well DVT Evaluation: No evidence of DVT seen on physical exam.  Discharge Diagnoses: 22 week IUFD, Cytotec induction of labor  Discharge Information: Date: 06/04/2014 Activity: pelvic rest Diet: routine Medications: PNV, Ibuprofen and Percocet Condition: stable Instructions: refer to practice specific booklet Discharge to: home Follow-up Information   Follow up with [facility], [place].. Schedule an appointment as soon as possible for a visit in 5 days.   Contact information:   [address] [place] [address] [phone]     CBC drawn prior to D/C   Newborn Data: Live born female  Birth Weight:  APGAR: 0,   IUFD Margarette Asal 06/04/2014, 6:39 PM

## 2014-06-04 NOTE — Progress Notes (Signed)
Patient ID: Marissa Gonzalez, female   DOB: 19-Aug-1976, 38 y.o.   MRN: [id number] [redacted]w[redacted]d  Received epid last PM Today cx still closed On iso for shingles outbreak on shoulder Will incr Cytotec to 464mcg q 4 hrs

## 2014-06-05 ENCOUNTER — Encounter (HOSPITAL_COMMUNITY): Payer: Self-pay | Admitting: *Deleted

## 2014-06-06 NOTE — H&P (Signed)
Marissa Gonzalez, MAGISTRO          ACCOUNT NO.:  [id number]  MEDICAL RECORD NO.:  [id number]  LOCATION:                                 FACILITY:  PHYSICIAN:  Ralene Bathe. Matthew Saras, M.D.DATE OF BIRTH:  1976/11/23  DATE OF ADMISSION: DATE OF DISCHARGE:                             HISTORY & PHYSICAL   CHIEF COMPLAINT:  IUFD at [redacted] weeks gestation.  HISTORY OF PRESENT ILLNESS:  A 38 year old G3, P1-0-1-1, at [redacted] weeks gestation.  This patient had anatomy screen, May 2015 that was incomplete.  A fibroid was noted, 5.3 x 5.3 cm, and she presented back today for followup anatomic ultrasound that showed no cardiac activity noted, measuring [redacted] weeks gestation.  Her AFP only had been documented as normal and she also had panorama testing for genetics that returned a female fetus otherwise normal.  She had in 2011, SAB with a twin demise.  In 2013, cesarean section for breech presentation for a healthy female.  She presents now for admission for Cytotec induction of labor.  The need to use a lower dose Cytotec protocol due to her prior history of LTCS discussed with her. Note that her rubella is not immune.  Blood type is A positive.  PAST MEDICAL HISTORY:  Please see the [facility] form for details of note in her history, prior LTCS.  PHYSICAL EXAMINATION:  VITAL SIGNS:  Temp 98.2, blood pressure 118/76. HEENT:  Unremarkable. NECK:  Supple without masses. LUNGS:  Clear. CARDIOVASCULAR:  Regular rate and rhythm without murmurs, rubs, or gallops. BREASTS:  Without masses. ABDOMEN:  A 20 cm fundal height.  Fetal heart rate not heard.  Cervix was closed. EXTREMITIES:  Unremarkable. NEUROLOGIC:  Unremarkable.  IMPRESSION:  Twenty-two week intrauterine pregnancy, intrauterine fetal death.  PLAN:  We will admit today, Cytotec labor induction.  This procedure including the fact that it may take a little longer at lower doses of Cytotec with her prior history of cesarean section discussed.   Pain relief techniques for later discussed with her also.    Coulton Schlink M. Matthew Saras, M.D.    [facility]/MEDQ  D:  06/03/2014  T:  06/03/2014  Job:  [id number]

## 2014-06-08 NOTE — Anesthesia Postprocedure Evaluation (Signed)
  Anesthesia Post-op Note  Patient: Marissa Gonzalez  Procedure(s) Performed: labor epidural   Patient Location: mother baby  Anesthesia Type: Epidural  Level of Consciousness: awake and alert   Airway and Oxygen Therapy: Patient Spontanous Breathing  Post-op Pain: mild  Post-op Assessment: Post-op Vital signs reviewed, Patient's Cardiovascular Status Stable, Respiratory Function Stable, Patent Airway and No signs of Nausea or vomiting  Last Vitals: There were no vitals filed for this visit.  Post-op Vital Signs: stable   Complications: No apparent anesthesia complications

## 2014-06-22 DEATH — deceased

## 2014-06-28 LAB — CHROMOSOME STD, POC(TISSUE)-NCBH

## 2014-10-24 ENCOUNTER — Encounter (HOSPITAL_COMMUNITY): Payer: Self-pay | Admitting: *Deleted

## 2015-01-06 LAB — OB RESULTS CONSOLE HIV ANTIBODY (ROUTINE TESTING): HIV: NONREACTIVE

## 2015-01-06 LAB — OB RESULTS CONSOLE GC/CHLAMYDIA
Chlamydia: NEGATIVE
GC PROBE AMP, GENITAL: NEGATIVE

## 2015-01-06 LAB — OB RESULTS CONSOLE ANTIBODY SCREEN: ANTIBODY SCREEN: NEGATIVE

## 2015-01-06 LAB — OB RESULTS CONSOLE ABO/RH: RH Type: POSITIVE

## 2015-01-06 LAB — OB RESULTS CONSOLE RPR: RPR: NONREACTIVE

## 2015-01-06 LAB — OB RESULTS CONSOLE HEPATITIS B SURFACE ANTIGEN: Hepatitis B Surface Ag: NEGATIVE

## 2015-01-06 LAB — OB RESULTS CONSOLE RUBELLA ANTIBODY, IGM: RUBELLA: NON-IMMUNE/NOT IMMUNE

## 2015-01-25 ENCOUNTER — Other Ambulatory Visit: Payer: Self-pay | Admitting: Obstetrics and Gynecology

## 2015-01-26 LAB — CYTOLOGY - PAP

## 2015-07-31 NOTE — H&P (Addendum)
Marissa Gonzalez is a 39 y.o. female G3P1 presenting for repeat c-section and permanent sterilization. History OB History    Gravida Para Term Preterm AB TAB SAB Ectopic Multiple Living   4 2 1 1 1  1   1      Past Medical History  Diagnosis Date  . Heart murmur   . Abnormal Pap smear   . Hx of migraines     as teenager  . Fibroid 2012    this pregnancy  . Sciatic nerve pain 2012    this pregnancy  . Headache(784.0)   . GERD (gastroesophageal reflux disease)     pregnancy related  . Lactose intolerance    Past Surgical History  Procedure Laterality Date  . Wisdom tooth extraction  1985  . Dental implant  1995  . No past surgeries    . Cesarean section  03/10/2012    Procedure: CESAREAN SECTION;  Surgeon: Cyril Mourning, MD;  Location: [facility];  Service: Gynecology;  Laterality: N/A;   Family History: family history includes Cancer in her maternal grandmother; Depression in her father and mother. Social History:  reports that she quit smoking about 15 years ago. She has never used smokeless tobacco. She reports that she does not drink alcohol or use illicit drugs.   Prenatal Transfer Tool  Maternal Diabetes: No Genetic Screening: Normal Maternal Ultrasounds/Referrals: Normal Fetal Ultrasounds or other Referrals:  None Maternal Substance Abuse:  No Significant Maternal Medications:  None Significant Maternal Lab Results:  None Other Comments:  None  ROS    unknown if currently breastfeeding. Exam Physical Exam  Prenatal labs: ABO, Rh:   Antibody:   Rubella:   RPR:    HBsAg:    HIV:    GBS:     Assessment/Plan:  Previous c-section, declines TOL, desires sterilization Rpt c-section, BTL  R/b/a discussed, questions answered, informed consent  Versie Soave 07/31/2015, 1:33 PM

## 2015-08-04 ENCOUNTER — Encounter (HOSPITAL_COMMUNITY): Payer: Self-pay

## 2015-08-05 NOTE — Anesthesia Preprocedure Evaluation (Addendum)
Anesthesia Evaluation  Patient identified by MRN, date of birth, ID band Patient awake    Reviewed: Allergy & Precautions, NPO status , Patient's Chart, lab work & pertinent test results, reviewed documented beta blocker date and time   Airway Mallampati: II   Neck ROM: Full    Dental  (+) Teeth Intact, Dental Advisory Given   Pulmonary former smoker (quit 2000),  breath sounds clear to auscultation        Cardiovascular negative cardio ROS  Rhythm:Regular     Neuro/Psych  Headaches,    GI/Hepatic GERD-  Medicated,  Endo/Other    Renal/GU      Musculoskeletal negative musculoskeletal ROS (+)   Abdominal (+)  Abdomen: soft.    Peds  Hematology   Anesthesia Other Findings   Reproductive/Obstetrics (+) Pregnancy                            Anesthesia Physical Anesthesia Plan  ASA: II  Anesthesia Plan: Spinal   Post-op Pain Management:    Induction:   Airway Management Planned:   Additional Equipment:   Intra-op Plan:   Post-operative Plan:   Informed Consent: I have reviewed the patients History and Physical, chart, labs and discussed the procedure including the risks, benefits and alternatives for the proposed anesthesia with the patient or authorized representative who has indicated his/her understanding and acceptance.     Plan Discussed with:   Anesthesia Plan Comments: (Check am labs)        Anesthesia Quick Evaluation

## 2015-08-07 ENCOUNTER — Encounter (HOSPITAL_COMMUNITY)
Admission: RE | Admit: 2015-08-07 | Discharge: 2015-08-07 | Disposition: A | Discharge status: Home / Self Care | Payer: BLUE CROSS/BLUE SHIELD | Source: Ambulatory Visit | Attending: Obstetrics and Gynecology | Admitting: Obstetrics and Gynecology

## 2015-08-07 ENCOUNTER — Encounter (HOSPITAL_COMMUNITY): Payer: Self-pay

## 2015-08-07 LAB — CBC
HCT: 39 % (ref 36.0–46.0)
Hemoglobin: 13.1 g/dL (ref 12.0–15.0)
MCH: 28.4 pg (ref 26.0–34.0)
MCHC: 33.6 g/dL (ref 30.0–36.0)
MCV: 84.4 fL (ref 78.0–100.0)
PLATELETS: 210 10*3/uL (ref 150–400)
RBC: 4.62 MIL/uL (ref 3.87–5.11)
RDW: 14.4 % (ref 11.5–15.5)
WBC: 8.5 10*3/uL (ref 4.0–10.5)

## 2015-08-07 LAB — TYPE AND SCREEN
ABO/RH(D): A POS
ANTIBODY SCREEN: NEGATIVE

## 2015-08-07 NOTE — Patient Instructions (Signed)
Your procedure is scheduled on:08/08/15  Enter through the Main Entrance at :42 am Pick up desk phone and dial [phone] and inform us of your arrival.  Please call [phone] if you have any problems the morning of surgery.  Remember: Do not eat food after midnight:tonight Clear liquids are ok until:9am on Tuesday  You may brush your teeth the morning of surgery.    DO NOT wear jewelry, eye make-up, lipstick,body lotion, or dark fingernail polish.  (Polished toes are ok) You may wear deodorant.  If you are to be admitted after surgery, leave suitcase in car until your room has been assigned. Patients discharged on the day of surgery will not be allowed to drive home. Wear loose fitting, comfortable clothes for your ride home.

## 2015-08-08 ENCOUNTER — Inpatient Hospital Stay (HOSPITAL_COMMUNITY)
Admission: RE | Admit: 2015-08-08 | Discharge: 2015-08-10 | DRG: 765 | Disposition: A | Discharge status: Home / Self Care | Payer: BLUE CROSS/BLUE SHIELD | Source: Ambulatory Visit | Attending: Obstetrics and Gynecology | Admitting: Obstetrics and Gynecology

## 2015-08-08 ENCOUNTER — Inpatient Hospital Stay (HOSPITAL_COMMUNITY): Payer: BLUE CROSS/BLUE SHIELD | Admitting: Anesthesiology

## 2015-08-08 ENCOUNTER — Encounter (HOSPITAL_COMMUNITY): Payer: Self-pay

## 2015-08-08 ENCOUNTER — Encounter (HOSPITAL_COMMUNITY)
Admission: RE | Disposition: A | Discharge status: Home / Self Care | Payer: Self-pay | Source: Ambulatory Visit | Attending: Obstetrics and Gynecology

## 2015-08-08 DIAGNOSIS — R21 Rash and other nonspecific skin eruption: Secondary | ICD-10-CM | POA: Diagnosis present

## 2015-08-08 DIAGNOSIS — Z3A40 40 weeks gestation of pregnancy: Secondary | ICD-10-CM | POA: Diagnosis present

## 2015-08-08 DIAGNOSIS — Z87891 Personal history of nicotine dependence: Secondary | ICD-10-CM

## 2015-08-08 DIAGNOSIS — O9962 Diseases of the digestive system complicating childbirth: Secondary | ICD-10-CM | POA: Diagnosis present

## 2015-08-08 DIAGNOSIS — O99354 Diseases of the nervous system complicating childbirth: Secondary | ICD-10-CM | POA: Diagnosis present

## 2015-08-08 DIAGNOSIS — E739 Lactose intolerance, unspecified: Secondary | ICD-10-CM | POA: Diagnosis present

## 2015-08-08 DIAGNOSIS — Z302 Encounter for sterilization: Secondary | ICD-10-CM

## 2015-08-08 DIAGNOSIS — K219 Gastro-esophageal reflux disease without esophagitis: Secondary | ICD-10-CM | POA: Diagnosis present

## 2015-08-08 DIAGNOSIS — G43909 Migraine, unspecified, not intractable, without status migrainosus: Secondary | ICD-10-CM | POA: Diagnosis present

## 2015-08-08 DIAGNOSIS — O3421 Maternal care for scar from previous cesarean delivery: Secondary | ICD-10-CM | POA: Diagnosis present

## 2015-08-08 DIAGNOSIS — Z98891 History of uterine scar from previous surgery: Secondary | ICD-10-CM

## 2015-08-08 HISTORY — PX: TUBAL LIGATION: SHX77

## 2015-08-08 LAB — RPR: RPR: NONREACTIVE

## 2015-08-08 SURGERY — Surgical Case
Anesthesia: Spinal | Site: Abdomen | Laterality: Bilateral

## 2015-08-08 MED ORDER — MORPHINE SULFATE 0.5 MG/ML IJ SOLN
INTRAMUSCULAR | Status: AC
  Filled 2015-08-08: qty 100

## 2015-08-08 MED ORDER — MORPHINE SULFATE (PF) 0.5 MG/ML IJ SOLN
INTRAMUSCULAR | Status: DC | PRN
  Administered 2015-08-08: .2 mg via INTRATHECAL

## 2015-08-08 MED ORDER — PHENYLEPHRINE 8 MG IN D5W 100 ML (0.08MG/ML) PREMIX OPTIME
INJECTION | INTRAVENOUS | Status: AC
  Filled 2015-08-08: qty 100

## 2015-08-08 MED ORDER — KETOROLAC TROMETHAMINE 30 MG/ML IJ SOLN: 30.0000 mg | Freq: Four times a day (QID) | INTRAMUSCULAR | Status: AC | PRN

## 2015-08-08 MED ORDER — LANOLIN HYDROUS EX OINT
1.0000 | TOPICAL_OINTMENT | CUTANEOUS | Status: DC | PRN
Start: 2015-08-08 — End: 2015-08-10

## 2015-08-08 MED ORDER — NALBUPHINE HCL 10 MG/ML IJ SOLN
INTRAMUSCULAR | Status: AC
  Filled 2015-08-08: qty 1

## 2015-08-08 MED ORDER — SIMETHICONE 80 MG PO CHEW: 80.0000 mg | CHEWABLE_TABLET | ORAL | Status: DC | PRN

## 2015-08-08 MED ORDER — NALOXONE HCL 1 MG/ML IJ SOLN: 1.0000 ug/kg/h | INTRAVENOUS | Status: DC | PRN

## 2015-08-08 MED ORDER — OXYTOCIN 40 UNITS IN LACTATED RINGERS INFUSION - SIMPLE MED
INTRAVENOUS | Status: DC | PRN
  Administered 2015-08-08: 40 [IU] via INTRAVENOUS

## 2015-08-08 MED ORDER — SODIUM CHLORIDE 0.9 % IJ SOLN: 3.0000 mL | INTRAMUSCULAR | Status: DC | PRN

## 2015-08-08 MED ORDER — MEDROXYPROGESTERONE ACETATE 150 MG/ML IM SUSP: 150.0000 mg | INTRAMUSCULAR | Status: DC | PRN

## 2015-08-08 MED ORDER — TETANUS-DIPHTH-ACELL PERTUSSIS 5-2.5-18.5 LF-MCG/0.5 IM SUSP: 0.5000 mL | Freq: Once | INTRAMUSCULAR | Status: DC

## 2015-08-08 MED ORDER — DEXTROSE IN LACTATED RINGERS 5 % IV SOLN
INTRAVENOUS | Status: DC
  Administered 2015-08-09: 02:00:00 via INTRAVENOUS

## 2015-08-08 MED ORDER — ONDANSETRON HCL 4 MG/2ML IJ SOLN
INTRAMUSCULAR | Status: DC | PRN
  Administered 2015-08-08: 4 mg via INTRAVENOUS

## 2015-08-08 MED ORDER — PRENATAL MULTIVITAMIN CH
1.0000 | ORAL_TABLET | Freq: Every day | ORAL | Status: DC
  Administered 2015-08-09 – 2015-08-10 (×2): 1 via ORAL
  Filled 2015-08-08 (×2): qty 1

## 2015-08-08 MED ORDER — DIPHENHYDRAMINE HCL 25 MG PO CAPS
25.0000 mg | ORAL_CAPSULE | ORAL | Status: DC | PRN
  Administered 2015-08-08 – 2015-08-09 (×4): 25 mg via ORAL
  Filled 2015-08-08 (×4): qty 1

## 2015-08-08 MED ORDER — SCOPOLAMINE 1 MG/3DAYS TD PT72
MEDICATED_PATCH | TRANSDERMAL | Status: AC
  Administered 2015-08-08: 1.5 mg via TRANSDERMAL
  Filled 2015-08-08: qty 1

## 2015-08-08 MED ORDER — NALBUPHINE HCL 10 MG/ML IJ SOLN: 5.0000 mg | Freq: Once | INTRAMUSCULAR | Status: AC | PRN

## 2015-08-08 MED ORDER — MEPERIDINE HCL 25 MG/ML IJ SOLN: 6.2500 mg | INTRAMUSCULAR | Status: DC | PRN

## 2015-08-08 MED ORDER — NALBUPHINE HCL 10 MG/ML IJ SOLN
5.0000 mg | Freq: Once | INTRAMUSCULAR | Status: AC | PRN
  Administered 2015-08-08: 5 mg via SUBCUTANEOUS

## 2015-08-08 MED ORDER — ACETAMINOPHEN 325 MG PO TABS: 650.0000 mg | ORAL_TABLET | ORAL | Status: DC | PRN

## 2015-08-08 MED ORDER — PROMETHAZINE HCL 25 MG/ML IJ SOLN: 6.2500 mg | INTRAMUSCULAR | Status: DC | PRN

## 2015-08-08 MED ORDER — OXYCODONE-ACETAMINOPHEN 5-325 MG PO TABS
2.0000 | ORAL_TABLET | ORAL | Status: DC | PRN
Start: 2015-08-08 — End: 2015-08-10
  Administered 2015-08-10: 2 via ORAL
  Filled 2015-08-08: qty 2

## 2015-08-08 MED ORDER — OXYCODONE-ACETAMINOPHEN 5-325 MG PO TABS
1.0000 | ORAL_TABLET | ORAL | Status: DC | PRN
  Administered 2015-08-09 – 2015-08-10 (×3): 1 via ORAL
  Filled 2015-08-08 (×3): qty 1

## 2015-08-08 MED ORDER — NALOXONE HCL 0.4 MG/ML IJ SOLN: 0.4000 mg | INTRAMUSCULAR | Status: DC | PRN

## 2015-08-08 MED ORDER — OXYTOCIN 10 UNIT/ML IJ SOLN
INTRAMUSCULAR | Status: AC
  Filled 2015-08-08: qty 4

## 2015-08-08 MED ORDER — DIPHENHYDRAMINE HCL 25 MG PO CAPS: 25.0000 mg | ORAL_CAPSULE | Freq: Four times a day (QID) | ORAL | Status: DC | PRN

## 2015-08-08 MED ORDER — SENNOSIDES-DOCUSATE SODIUM 8.6-50 MG PO TABS
2.0000 | ORAL_TABLET | ORAL | Status: DC
Start: 2015-08-09 — End: 2015-08-10
  Administered 2015-08-09 – 2015-08-10 (×2): 2 via ORAL
  Filled 2015-08-08 (×2): qty 2

## 2015-08-08 MED ORDER — IBUPROFEN 600 MG PO TABS
600.0000 mg | ORAL_TABLET | Freq: Four times a day (QID) | ORAL | Status: DC
  Administered 2015-08-08 – 2015-08-10 (×8): 600 mg via ORAL
  Filled 2015-08-08 (×8): qty 1

## 2015-08-08 MED ORDER — ONDANSETRON HCL 4 MG/2ML IJ SOLN
INTRAMUSCULAR | Status: AC
  Filled 2015-08-08: qty 2

## 2015-08-08 MED ORDER — SCOPOLAMINE 1 MG/3DAYS TD PT72: 1.0000 | MEDICATED_PATCH | Freq: Once | TRANSDERMAL | Status: DC

## 2015-08-08 MED ORDER — FENTANYL CITRATE (PF) 100 MCG/2ML IJ SOLN
25.0000 ug | INTRAMUSCULAR | Status: DC | PRN
  Administered 2015-08-08: 50 ug via INTRAVENOUS

## 2015-08-08 MED ORDER — PANTOPRAZOLE SODIUM 40 MG PO TBEC
40.0000 mg | DELAYED_RELEASE_TABLET | Freq: Every day | ORAL | Status: DC
  Administered 2015-08-09 – 2015-08-10 (×2): 40 mg via ORAL
  Filled 2015-08-08 (×2): qty 1

## 2015-08-08 MED ORDER — OXYTOCIN 40 UNITS IN LACTATED RINGERS INFUSION - SIMPLE MED: 62.5000 mL/h | INTRAVENOUS | Status: AC

## 2015-08-08 MED ORDER — NALBUPHINE HCL 10 MG/ML IJ SOLN: 5.0000 mg | INTRAMUSCULAR | Status: DC | PRN

## 2015-08-08 MED ORDER — ONDANSETRON HCL 4 MG/2ML IJ SOLN: 4.0000 mg | Freq: Three times a day (TID) | INTRAMUSCULAR | Status: DC | PRN

## 2015-08-08 MED ORDER — FENTANYL CITRATE (PF) 100 MCG/2ML IJ SOLN
INTRAMUSCULAR | Status: DC | PRN
  Administered 2015-08-08: 20 ug via INTRATHECAL

## 2015-08-08 MED ORDER — SIMETHICONE 80 MG PO CHEW
80.0000 mg | CHEWABLE_TABLET | Freq: Three times a day (TID) | ORAL | Status: DC
  Administered 2015-08-08 – 2015-08-10 (×5): 80 mg via ORAL
  Filled 2015-08-08 (×6): qty 1

## 2015-08-08 MED ORDER — CEFAZOLIN SODIUM-DEXTROSE 2-3 GM-% IV SOLR
INTRAVENOUS | Status: AC
  Filled 2015-08-08: qty 50

## 2015-08-08 MED ORDER — NALBUPHINE HCL 10 MG/ML IJ SOLN
5.0000 mg | INTRAMUSCULAR | Status: DC | PRN
  Administered 2015-08-08: 5 mg via SUBCUTANEOUS

## 2015-08-08 MED ORDER — FENTANYL CITRATE (PF) 100 MCG/2ML IJ SOLN
INTRAMUSCULAR | Status: AC
  Filled 2015-08-08: qty 2

## 2015-08-08 MED ORDER — ZOLPIDEM TARTRATE 5 MG PO TABS: 5.0000 mg | ORAL_TABLET | Freq: Every evening | ORAL | Status: DC | PRN

## 2015-08-08 MED ORDER — LACTATED RINGERS IV SOLN
INTRAVENOUS | Status: DC
  Administered 2015-08-08 (×3): via INTRAVENOUS

## 2015-08-08 MED ORDER — CEFAZOLIN SODIUM-DEXTROSE 2-3 GM-% IV SOLR
2.0000 g | INTRAVENOUS | Status: AC
  Administered 2015-08-08: 2 g via INTRAVENOUS

## 2015-08-08 MED ORDER — WITCH HAZEL-GLYCERIN EX PADS: 1.0000 "application " | MEDICATED_PAD | CUTANEOUS | Status: DC | PRN

## 2015-08-08 MED ORDER — DIBUCAINE 1 % RE OINT: 1.0000 "application " | TOPICAL_OINTMENT | RECTAL | Status: DC | PRN

## 2015-08-08 MED ORDER — FENTANYL CITRATE (PF) 100 MCG/2ML IJ SOLN
INTRAMUSCULAR | Status: AC
  Filled 2015-08-08: qty 4

## 2015-08-08 MED ORDER — ACETAMINOPHEN 500 MG PO TABS
1000.0000 mg | ORAL_TABLET | Freq: Four times a day (QID) | ORAL | Status: AC
  Administered 2015-08-08 – 2015-08-09 (×3): 1000 mg via ORAL
  Filled 2015-08-08 (×3): qty 2

## 2015-08-08 MED ORDER — DIPHENHYDRAMINE HCL 50 MG/ML IJ SOLN: 12.5000 mg | INTRAMUSCULAR | Status: DC | PRN

## 2015-08-08 MED ORDER — LACTATED RINGERS IV SOLN
INTRAVENOUS | Status: DC | PRN
  Administered 2015-08-08: 13:00:00 via INTRAVENOUS

## 2015-08-08 MED ORDER — MENTHOL 3 MG MT LOZG
1.0000 | LOZENGE | OROMUCOSAL | Status: DC | PRN
Start: 2015-08-08 — End: 2015-08-10

## 2015-08-08 MED ORDER — PHENYLEPHRINE 40 MCG/ML (10ML) SYRINGE FOR IV PUSH (FOR BLOOD PRESSURE SUPPORT)
PREFILLED_SYRINGE | INTRAVENOUS | Status: AC
  Filled 2015-08-08: qty 10

## 2015-08-08 MED ORDER — SCOPOLAMINE 1 MG/3DAYS TD PT72
1.0000 | MEDICATED_PATCH | Freq: Once | TRANSDERMAL | Status: DC
  Administered 2015-08-08: 1.5 mg via TRANSDERMAL

## 2015-08-08 MED ORDER — SIMETHICONE 80 MG PO CHEW
80.0000 mg | CHEWABLE_TABLET | ORAL | Status: DC
  Administered 2015-08-09 – 2015-08-10 (×2): 80 mg via ORAL
  Filled 2015-08-08 (×2): qty 1

## 2015-08-08 MED ORDER — BUPIVACAINE IN DEXTROSE 0.75-8.25 % IT SOLN
INTRATHECAL | Status: DC | PRN
  Administered 2015-08-08: 1.5 mL via INTRATHECAL

## 2015-08-08 MED ORDER — PHENYLEPHRINE 8 MG IN D5W 100 ML (0.08MG/ML) PREMIX OPTIME
INJECTION | INTRAVENOUS | Status: DC | PRN
  Administered 2015-08-08: 60 ug/min via INTRAVENOUS

## 2015-08-08 MED ORDER — MEASLES, MUMPS & RUBELLA VAC ~~LOC~~ INJ
0.5000 mL | INJECTION | Freq: Once | SUBCUTANEOUS | Status: AC
  Administered 2015-08-09: 0.5 mL via SUBCUTANEOUS
  Filled 2015-08-08: qty 0.5

## 2015-08-08 SURGICAL SUPPLY — 29 items
CLAMP CORD UMBIL (MISCELLANEOUS) IMPLANT
CLIP FILSHIE TUBAL LIGA STRL (Clip) ×3 IMPLANT
CLOTH BEACON ORANGE TIMEOUT ST (SAFETY) ×3 IMPLANT
DRAPE SHEET LG 3/4 BI-LAMINATE (DRAPES) IMPLANT
DRSG OPSITE POSTOP 4X10 (GAUZE/BANDAGES/DRESSINGS) ×3 IMPLANT
DURAPREP 26ML APPLICATOR (WOUND CARE) ×3 IMPLANT
ELECT REM PT RETURN 9FT ADLT (ELECTROSURGICAL) ×3
ELECTRODE REM PT RTRN 9FT ADLT (ELECTROSURGICAL) ×1 IMPLANT
EXTRACTOR VACUUM M CUP 4 TUBE (SUCTIONS) IMPLANT
EXTRACTOR VACUUM M CUP 4' TUBE (SUCTIONS)
GLOVE BIO SURGEON STRL SZ 6.5 (GLOVE) ×2 IMPLANT
GLOVE BIO SURGEONS STRL SZ 6.5 (GLOVE) ×1
GLOVE BIOGEL PI IND STRL 7.0 (GLOVE) ×1 IMPLANT
GLOVE BIOGEL PI INDICATOR 7.0 (GLOVE) ×2
GOWN STRL REUS W/TWL LRG LVL3 (GOWN DISPOSABLE) ×6 IMPLANT
KIT ABG SYR 3ML LUER SLIP (SYRINGE) IMPLANT
LIQUID BAND (GAUZE/BANDAGES/DRESSINGS) ×3 IMPLANT
NEEDLE HYPO 25X5/8 SAFETYGLIDE (NEEDLE) IMPLANT
NS IRRIG 1000ML POUR BTL (IV SOLUTION) ×3 IMPLANT
PACK C SECTION WH (CUSTOM PROCEDURE TRAY) ×3 IMPLANT
PAD OB MATERNITY 4.3X12.25 (PERSONAL CARE ITEMS) ×3 IMPLANT
SUT CHROMIC 0 CT 802H (SUTURE) IMPLANT
SUT CHROMIC 0 CTX 36 (SUTURE) ×9 IMPLANT
SUT MON AB-0 CT1 36 (SUTURE) ×3 IMPLANT
SUT PDS AB 0 CTX 60 (SUTURE) ×3 IMPLANT
SUT PLAIN 0 NONE (SUTURE) IMPLANT
SUT VIC AB 4-0 KS 27 (SUTURE) IMPLANT
TOWEL OR 17X24 6PK STRL BLUE (TOWEL DISPOSABLE) ×3 IMPLANT
TRAY FOLEY CATH SILVER 14FR (SET/KITS/TRAYS/PACK) IMPLANT

## 2015-08-08 NOTE — Op Note (Signed)
08/08/2015  2:03 PM  PATIENT:  Marissa Gonzalez  39 y.o. female  PRE-OPERATIVE DIAGNOSIS:  previous X 1;DESIRES STERILIZATION  POST-OPERATIVE DIAGNOSIS:  previous X 1;DESIRES STERILIZATION  PROCEDURE: repeat c-section, partial left salpingectomy, right tubal ligation with filsche clip  SURGEON:  Surgeon(s) and Role:    * Marylynn Pearson, MD - Primary   ASSISTANTSNelwyn Salisbury, RN   ANESTHESIA:   spinal  EBL:  Total I/O In: 3000 [I.V.:3000] Out: 1625 [Urine:125; Blood:1500]  BLOOD ADMINISTERED:none  DRAINS: none   LOCAL MEDICATIONS USED:  NONE  SPECIMEN:  Source of Specimen:  segment of left fallopian tube  DISPOSITION OF SPECIMEN:  PATHOLOGY  COUNTS:  YES  TOURNIQUET:  * No tourniquets in log *  DICTATION: .Other Dictation: Dictation Number pending  PLAN OF CARE: Admit to inpatient   PATIENT DISPOSITION:  PACU - hemodynamically stable.   Delay start of Pharmacological VTE agent (>24hrs) due to surgical blood loss or risk of bleeding: no

## 2015-08-08 NOTE — Anesthesia Postprocedure Evaluation (Signed)
  Anesthesia Post-op Note  Patient: Marissa Gonzalez  Procedure(s) Performed: Procedure(s) with comments: CESAREAN SECTION WITH Left Partial Salpingectomy, and Right Ligation of Fallopian tube with Filshie Clip. (Bilateral) - edc 08/12/15 [name] RNFA confirmed 7/29...tms   Patient Location: PACU  Anesthesia Type:Spinal  Level of Consciousness: awake  Airway and Oxygen Therapy: Patient Spontanous Breathing  Post-op Pain: mild  Post-op Assessment: Post-op Vital signs reviewed, Patient's Cardiovascular Status Stable, Respiratory Function Stable, Patent Airway and No signs of Nausea or vomiting              Post-op Vital Signs: Reviewed and stable  Last Vitals:  Filed Vitals:   08/08/15 1414  BP: 112/66  Pulse: 73  Temp: 36.4 C  Resp: 14    Complications: No apparent anesthesia complications

## 2015-08-08 NOTE — Transfer of Care (Signed)
Immediate Anesthesia Transfer of Care Note  Patient: Marissa Gonzalez  Procedure(s) Performed: Procedure(s) with comments: CESAREAN SECTION WITH Left Partial Salpingectomy, and Right Ligation of Fallopian tube with Filshie Clip. (Bilateral) - edc 08/12/15 [name] RNFA confirmed 7/29...tms   Patient Location: PACU  Anesthesia Type:Spinal  Level of Consciousness: awake, alert  and oriented  Airway & Oxygen Therapy: Patient Spontanous Breathing  Post-op Assessment: Report given to RN and Post -op Vital signs reviewed and stable  Post vital signs: Reviewed and stable  Last Vitals:  Filed Vitals:   08/08/15 1142  BP: 142/72  Pulse: 97  Temp: 37 C  Resp: 20    Complications: No apparent anesthesia complications

## 2015-08-08 NOTE — Anesthesia Procedure Notes (Addendum)
Spinal Patient location during procedure: OR Start time: 08/08/2015 1:00 PM End time: 08/08/2015 1:10 PM Staffing Anesthesiologist: Alexis Frock Preanesthetic Checklist Completed: patient identified, site marked, surgical consent, pre-op evaluation, timeout performed, IV checked, risks and benefits discussed and monitors and equipment checked Spinal Block Patient position: sitting Prep: site prepped and draped and DuraPrep Patient monitoring: heart rate, continuous pulse ox and blood pressure Approach: midline Location: L3-4 Injection technique: single-shot Needle Needle type: Pencil-Tip  Needle gauge: 24 G Needle length: 10 cm Needle insertion depth: 6 cm Additional Notes No complications

## 2015-08-08 NOTE — Lactation Note (Signed)
This note was copied from the chart of [place]. Lactation Consultation Note  P2, Breastfed and pumped with 1st child for 10 months.  States she had milk supply issues. She used nipple shield with first child.  Mtoher states this baby is latching much better than first. Mother has flat nipples. Reviewed how to use shells and prepump with hand pump. Assisted latching baby in football hold using breast compression. Sucks and swallow observed.  Nipple pinched when unlatched.  Encouraged mother to keep baby deep on breast. Provided mother w/ comfort gels.  Reviewed pg 24 in Baby  & Me booklet for monitoring voids/stools. Mom encouraged to feed baby 8-12 times/24 hours and with feeding cues.  Mom made aware of O/P services, breastfeeding support groups, community resources, and our phone # for post-discharge questions.    Patient Name: Boy Armenta Erskin JGGEZ'M Date: 08/08/2015 Reason for consult: Initial assessment   Maternal Data Has patient been taught Hand Expression?: Yes Does the patient have breastfeeding experience prior to this delivery?: Yes  Feeding Feeding Type: Breast Fed  LATCH Score/Interventions Latch: Grasps breast easily, tongue down, lips flanged, rhythmical sucking.  Audible Swallowing: A few with stimulation Intervention(s): Skin to skin;Hand expression;Alternate breast massage  Type of Nipple: Flat  Comfort (Breast/Nipple): Soft / non-tender     Hold (Positioning): Assistance needed to correctly position infant at breast and maintain latch.  LATCH Score: 7  Lactation Tools Discussed/Used     Consult Status Consult Status: Follow-up Date: 08/09/15 Follow-up type: In-patient    Vivianne Master [facility] 08/08/2015, 7:10 PM

## 2015-08-09 ENCOUNTER — Encounter (HOSPITAL_COMMUNITY): Payer: Self-pay | Admitting: Obstetrics and Gynecology

## 2015-08-09 LAB — CBC
HCT: 29.1 % — ABNORMAL LOW (ref 36.0–46.0)
Hemoglobin: 9.3 g/dL — ABNORMAL LOW (ref 12.0–15.0)
MCH: 27.1 pg (ref 26.0–34.0)
MCHC: 32 g/dL (ref 30.0–36.0)
MCV: 84.8 fL (ref 78.0–100.0)
Platelets: 152 10*3/uL (ref 150–400)
RBC: 3.43 MIL/uL — ABNORMAL LOW (ref 3.87–5.11)
RDW: 14.4 % (ref 11.5–15.5)
WBC: 11.4 10*3/uL — ABNORMAL HIGH (ref 4.0–10.5)

## 2015-08-09 LAB — BIRTH TISSUE RECOVERY COLLECTION (PLACENTA DONATION)

## 2015-08-09 MED ORDER — HYDROCORTISONE 1 % EX CREA
TOPICAL_CREAM | Freq: Two times a day (BID) | CUTANEOUS | Status: DC
  Administered 2015-08-09 – 2015-08-10 (×3): via TOPICAL
  Filled 2015-08-09: qty 28

## 2015-08-09 MED ORDER — HYDROCORTISONE 1 % EX OINT
TOPICAL_OINTMENT | Freq: Two times a day (BID) | CUTANEOUS | Status: DC
  Filled 2015-08-09: qty 28.35

## 2015-08-09 NOTE — Progress Notes (Signed)
Subjective: Postpartum Day 1: Cesarean Delivery Patient reports tolerating PO.    Objective: Vital signs in last 24 hours: Temp:  [97.5 F (36.4 C)-98.9 F (37.2 C)] 98.7 F (37.1 C) (08/17 0445) Pulse Rate:  [53-97] 74 (08/17 0445) Resp:  [14-27] 20 (08/17 0445) BP: (85-142)/(54-86) 107/54 mmHg (08/17 0445) SpO2:  [96 %-100 %] 96 % (08/17 0445) Weight:  [213 lb (96.616 kg)] 213 lb (96.616 kg) (08/16 2100)  Physical Exam:  General: alert and cooperative Lochia: appropriate Uterine Fundus: firm Incision: small drainage noted on bandage DVT Evaluation: No evidence of DVT seen on physical exam. Negative Homan's sign. No cords or calf tenderness. No significant calf/ankle edema. Pruritic rash on abd, non- erythematous, probable contact  Recent Labs  08/07/15 1200 08/09/15 0550  HGB 13.1 9.3*  HCT 39.0 29.1*    Assessment/Plan: Status post Cesarean section. Doing well postoperatively.  Continue current care Hydrocortisone to rash on abd.  Dorsel Flinn G 08/09/2015, 9:44 AM

## 2015-08-09 NOTE — Anesthesia Postprocedure Evaluation (Signed)
  Anesthesia Post-op Note  Patient: Marissa Gonzalez  Procedure(s) Performed: Procedure(s) with comments: CESAREAN SECTION WITH Left Partial Salpingectomy, and Right Ligation of Fallopian tube with Filshie Clip. (Bilateral) - edc 08/12/15 [name] RNFA confirmed 7/29...tms   Patient Location: Mother/Baby  Anesthesia Type:Spinal  Level of Consciousness: awake, alert  and oriented  Airway and Oxygen Therapy: Patient Spontanous Breathing  Post-op Pain: none  Post-op Assessment: Post-op Vital signs reviewed, Patient's Cardiovascular Status Stable, Respiratory Function Stable, Patent Airway, Adequate PO intake, Pain level controlled, No headache and No backache  Post-op Vital Signs: Reviewed and stable  Last Vitals:  Filed Vitals:   08/09/15 0445  BP: 107/54  Pulse: 74  Temp: 37.1 C  Resp: 20    Complications: No apparent anesthesia complications

## 2015-08-09 NOTE — Addendum Note (Signed)
Addendum  created 08/09/15 0936 by Jonna Munro, CRNA   Modules edited: Notes Section   Notes Section:  File: [id number]

## 2015-08-09 NOTE — Plan of Care (Signed)
Problem: Phase II Progression Outcomes Goal: Incision intact & without signs/symptoms of infection Outcome: Completed/Met Date Met:  08/09/15 Staples under [facility] drsg.

## 2015-08-09 NOTE — Op Note (Signed)
NAMEFRANCYS, Marissa Gonzalez          ACCOUNT NO.:  [id number]  MEDICAL RECORD NO.:  [id number]  LOCATION:  3235                          FACILITY:  [facility]  PHYSICIAN:  Marylynn Pearson, MD    DATE OF BIRTH:  05-08-1976  DATE OF PROCEDURE: DATE OF DISCHARGE:                              OPERATIVE REPORT   PREOPERATIVE DIAGNOSES: 1. Previous cesarean section. 2. Desires permanent sterilization.  POSTOPERATIVE DIAGNOSES: 1. Previous cesarean section. 2. Desires permanent sterilization.  PROCEDURE: 1. Repeat cesarean section. 2. Left partial salpingectomy. 3. Right tubal ligation with Filshie clip.  SURGEON:  Marylynn Pearson, M.D.  BLOOD LOSS:  1500 mL.  URINE OUTPUT:  Clear, adequate.  ANESTHESIA:  Spinal.  SPECIMENS:  Portion of left fallopian tube.  CONDITION:  Stable to recovery room.  DESCRIPTION OF PROCEDURE:  The patient was taken to the operating room, where spinal anesthesia was performed.  She was placed in a supine position with a left tilt.  Prepped and draped in sterile fashion.  A Foley catheter was inserted.  Time-out procedure was performed. Pfannenstiel skin incision was made with a scalpel and extended sharply to the level of the fascia.  The fascia was incised in the midline and extended laterally using curved Mayo scissors.  Kocher clamps were used to tent upwards on the fascia and the underlying rectus muscles were dissected off using sharp and blunt dissection.  Peritoneum was identified and entered sharply.  This was extended superiorly and inferiorly with good visualization of the bladder.  The bladder blade was inserted.  Uterine incision was made with a scalpel.  Multiple large blood vessels were entered upon entry majority of blood loss occurred at this time.  The fetus was delivered using standard maneuvers.  Cord was clamped and cut and the infant was taken to the NICU team.  The placenta was manually removed.  The uterus was cleared of all clots  and debris using a dry lap sponge.  The uterine incision was reapproximated using a double layer closure of 0 chromic in a running, locked fashion.  The left fallopian tube was grasped and tented upward with a Babcock clamp. Mesosalpinx was incised using the Bovie and a knuckle of fallopian tube was doubly tied off using plain gut suture.  The knuckle was excised using Metzenbaum scissors and hemostasis was achieved.  The right fallopian tube was significantly edematous with multiple torturous blood vessels surrounding partial salpingectomy was not possible.  For this reason, a Filshie clip was applied.  Good blanching was noted. Hemostasis was seen.  The uterus was then placed back into the pelvic cavity and the pelvis was irrigated with saline.  Uterine incision was reinspected and again found to be hemostatic.  The right fallopian tube with Filshie clip was re-inspected.  Filshie clip was then placed and hemostatic.  The bladder blade was then removed.  The peritoneum was closed with Monocryl.  The fascia was closed with PDS and the skin was closed with Vicryl.  Sponge, lap, needle, and instrument counts were correct x2.     Marylynn Pearson, MD     GA/MEDQ  D:  08/08/2015  T:  08/09/2015  Job:  [id number]

## 2015-08-10 ENCOUNTER — Encounter (HOSPITAL_COMMUNITY): Payer: Self-pay | Admitting: *Deleted

## 2015-08-10 MED ORDER — IBUPROFEN 600 MG PO TABS: 600.0000 mg | ORAL_TABLET | Freq: Four times a day (QID) | ORAL | Status: DC

## 2015-08-10 MED ORDER — OXYCODONE-ACETAMINOPHEN 5-325 MG PO TABS: 1.0000 | ORAL_TABLET | ORAL | Status: DC | PRN

## 2015-08-10 NOTE — Discharge Summary (Signed)
Obstetric Discharge Summary Reason for Admission: cesarean section Prenatal Procedures: ultrasound Intrapartum Procedures: cesarean: low cervical, transverse Postpartum Procedures: none Complications-Operative and Postpartum: none HEMOGLOBIN  Date Value Ref Range Status  08/09/2015 9.3* 12.0 - 15.0 g/dL Final    Comment:    REPEATED TO VERIFY DELTA CHECK NOTED    HCT  Date Value Ref Range Status  08/09/2015 29.1* 36.0 - 46.0 % Final    Physical Exam:  General: alert and cooperative Lochia: appropriate Uterine Fundus: firm Incision: healing well DVT Evaluation: No evidence of DVT seen on physical exam. Negative Homan's sign. No cords or calf tenderness. Calf/Ankle edema is present.  Discharge Diagnoses: Term Pregnancy-delivered  Discharge Information: Date: 08/10/2015 Activity: pelvic rest Diet: routine Medications: PNV, Ibuprofen and Percocet Condition: stable Instructions: refer to practice specific booklet Discharge to: home   Newborn Data: Live born female  Birth Weight: 8 lb 1.1 oz (3660 g) APGAR: 8, 9  Home with mother.  CURTIS,CAROL G 08/10/2015, 9:24 AM

## 2015-08-10 NOTE — Lactation Note (Signed)
This note was copied from the chart of [place]. Lactation Consultation Note  Follow up visit made prior to discharge.  Baby is 52 hours old and has a 11% weight loss.  Mom states she breastfed her daughter for 10 months but struggled with supply and she needed formula supplementation in addition.  Mom eventually pumped and bottle fed.  Mom has been using a nipple shield but c/o of painful, cracked nipples.  Nipples are flat and positional stripes and cracks noted.  Comfort gels given with instructions.  Mom given a 24 mm nipple shield to try for increased comfort and wider latch.  Parents have started to supplement with 10 mls of formula instilled into nipple shield every 3 hours.  Discussed day of life volume parameters and recommended increasing supplement to 20 mls every 3 hours today and increasing by 10 mls(more if baby desires) each day with a goal of 60 mls by 1 week.  Parents given a 5 french feeding tube to use under shield with 12 ml. Syringe in order to give more volume easily.  Instructed to use wide based bottle if this becomes more difficult.  Stressed importance of adequate stimulation and emptying of breasts to fully establish milk supply.  Discussed taking a break from breast at times if she becomes too fatigued or overwhelmed and pump and bottle feed.  Mom has a medela DEBP at home.  Parents instructed to keep feeding diary.  Lactation outpatient services and support recommended.  Mom did use these services with first baby.  She did state she wasn't going to drive herself crazy with breastfeeding issues with this baby.  Mom will call out if she would like assist prior to discharge.  Patient Name: Marissa Gonzalez Date: 08/10/2015     Maternal Data    Feeding    LATCH Score/Interventions                      Lactation Tools Discussed/Used     Consult Status      Ave Filter 08/10/2015, 11:44 AM

## 2015-08-10 NOTE — Discharge Instructions (Signed)
Iron-Rich Diet An iron-rich diet contains foods that are good sources of iron. Iron is an important mineral that helps your body produce hemoglobin. Hemoglobin is a protein in red blood cells that carries oxygen to the body's tissues. Sometimes, the iron level in your blood can be low. This may be caused by:  A lack of iron in your diet.  Blood loss.  Times of growth, such as during pregnancy or during a child's growth and development. Low levels of iron can cause a decrease in the number of red blood cells. This can result in iron deficiency anemia. Iron deficiency anemia symptoms include:  Tiredness.  Weakness.  Irritability.  Increased chance of infection. Here are some recommendations for daily iron intake:  Males older than 37 years of age need 8 mg of iron per day.  Women ages 19 to 50 need 18 mg of iron per day.  Pregnant women need 27 mg of iron per day, and women who are over 19 years of age and breastfeeding need 9 mg of iron per day.  Women over the age of 50 need 8 mg of iron per day. SOURCES OF IRON There are 2 types of iron that are found in food: heme iron and nonheme iron. Heme iron is absorbed by the body better than nonheme iron. Heme iron is found in meat, poultry, and fish. Nonheme iron is found in grains, beans, and vegetables. Heme Iron Sources Food / Iron (mg)  Chicken liver, 3 oz (85 g)/ 10 mg  Beef liver, 3 oz (85 g)/ 5.5 mg  Oysters, 3 oz (85 g)/ 8 mg  Beef, 3 oz (85 g)/ 2 to 3 mg  Shrimp, 3 oz (85 g)/ 2.8 mg  Turkey, 3 oz (85 g)/ 2 mg  Chicken, 3 oz (85 g) / 1 mg  Fish (tuna, halibut), 3 oz (85 g)/ 1 mg  Pork, 3 oz (85 g)/ 0.9 mg Nonheme Iron Sources Food / Iron (mg)  Ready-to-eat breakfast cereal, iron-fortified / 3.9 to 7 mg  Tofu,  cup / 3.4 mg  Kidney beans,  cup / 2.6 mg  Baked potato with skin / 2.7 mg  Asparagus,  cup / 2.2 mg  Avocado / 2 mg  Dried peaches,  cup / 1.6 mg  Raisins,  cup / 1.5 mg  Soy milk, 1 cup  / 1.5 mg  Whole-wheat bread, 1 slice / 1.2 mg  Spinach, 1 cup / 0.8 mg  Broccoli,  cup / 0.6 mg IRON ABSORPTION Certain foods can decrease the body's absorption of iron. Try to avoid these foods and beverages while eating meals with iron-containing foods:  Coffee.  Tea.  Fiber.  Soy. Foods containing vitamin C can help increase the amount of iron your body absorbs from iron sources, especially from nonheme sources. Eat foods with vitamin C along with iron-containing foods to increase your iron absorption. Foods that are high in vitamin C include many fruits and vegetables. Some good sources are:  Fresh orange juice.  Oranges.  Strawberries.  Mangoes.  Grapefruit.  Red bell peppers.  Green bell peppers.  Broccoli.  Potatoes with skin.  Tomato juice. Document Released: 07/23/2005 Document Revised: 03/02/2012 Document Reviewed: 05/30/2011 ExitCare Patient Information 2015 [employer]. This information is not intended to replace advice given to you by your health care provider. Make sure you discuss any questions you have with your health care provider.  

## 2015-08-17 ENCOUNTER — Other Ambulatory Visit (HOSPITAL_COMMUNITY): Payer: Self-pay | Admitting: Obstetrics and Gynecology

## 2015-08-17 ENCOUNTER — Encounter (INDEPENDENT_AMBULATORY_CARE_PROVIDER_SITE_OTHER): Payer: Self-pay

## 2015-08-17 ENCOUNTER — Ambulatory Visit (HOSPITAL_COMMUNITY)
Admission: RE | Admit: 2015-08-17 | Discharge: 2015-08-17 | Disposition: A | Discharge status: Home / Self Care | Payer: BLUE CROSS/BLUE SHIELD | Source: Ambulatory Visit | Attending: Obstetrics and Gynecology | Admitting: Obstetrics and Gynecology

## 2015-08-17 DIAGNOSIS — R062 Wheezing: Secondary | ICD-10-CM | POA: Diagnosis not present

## 2015-08-17 DIAGNOSIS — R0602 Shortness of breath: Secondary | ICD-10-CM | POA: Diagnosis not present

## 2015-08-17 IMAGING — CR DG CHEST 2V
2 series · 2 of 2 positions shown · non-contrast
Comparison: None.

CLINICAL DATA: Patient with shortness of breath since C-section
[DATE]. Wheezing.

EXAM:
CHEST  2 VIEW

[chest pa]
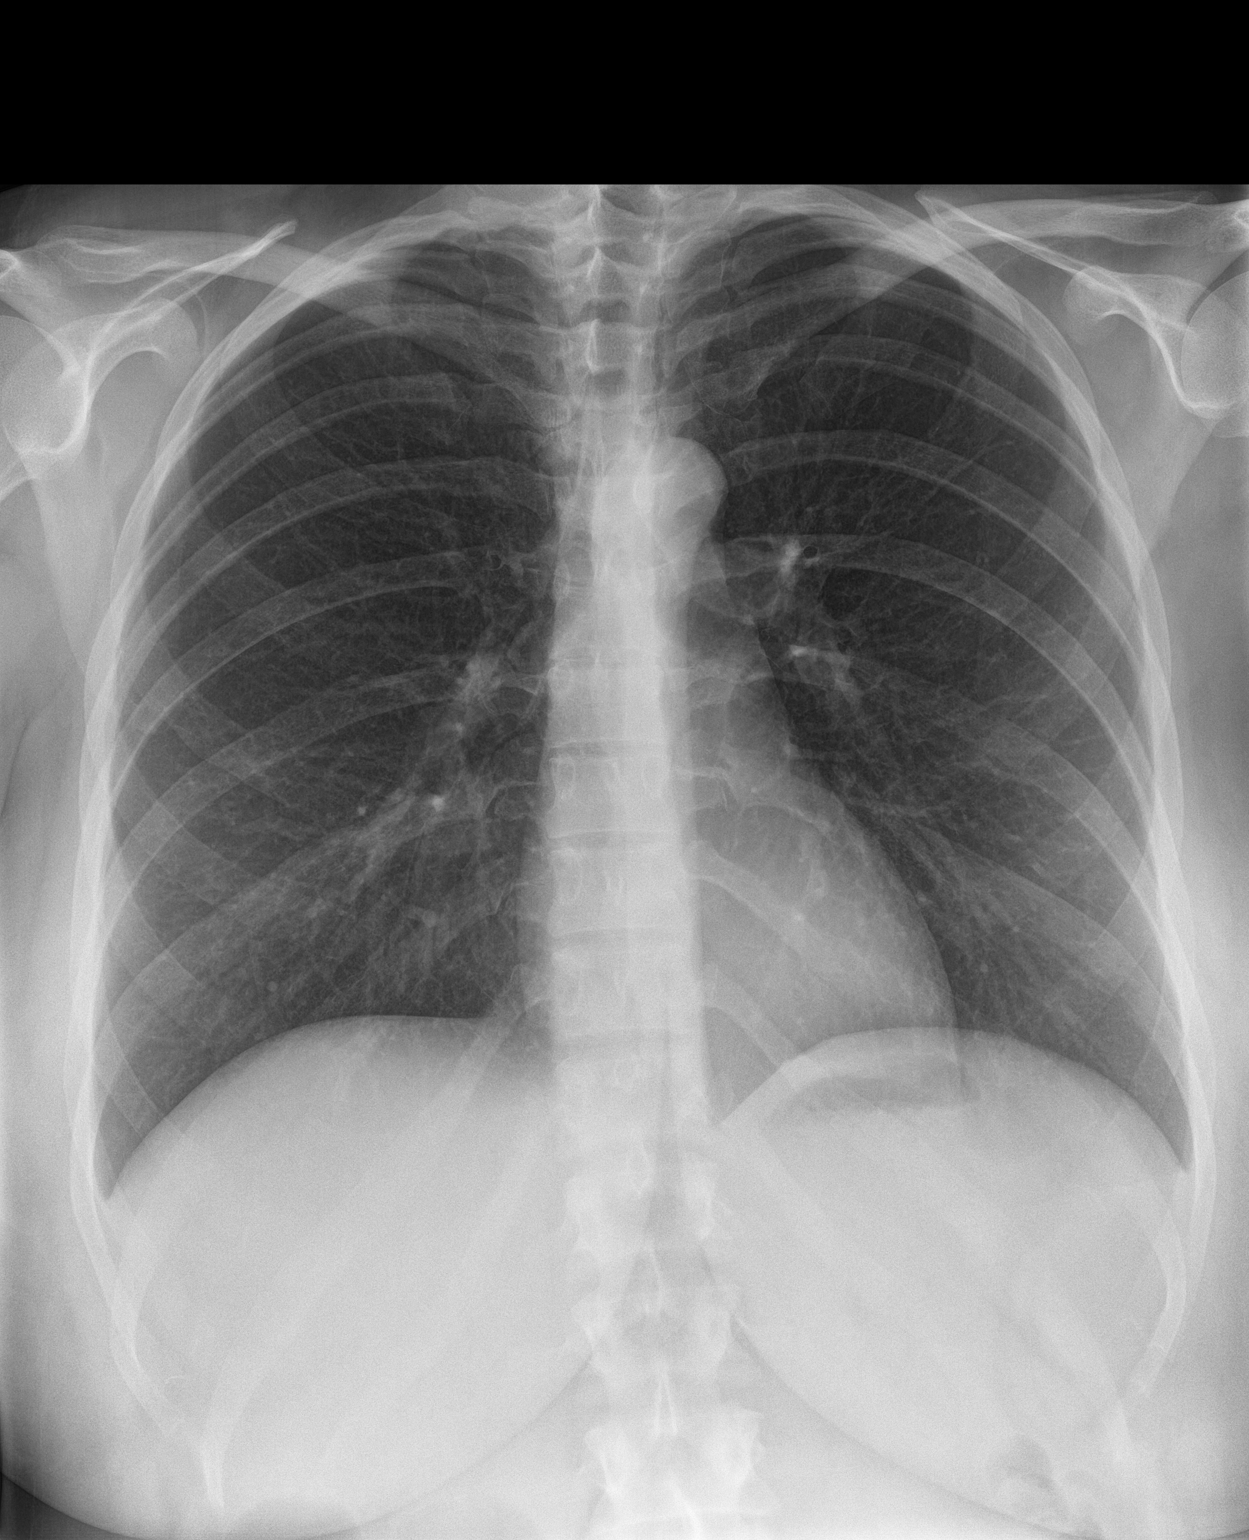

[chest lat]
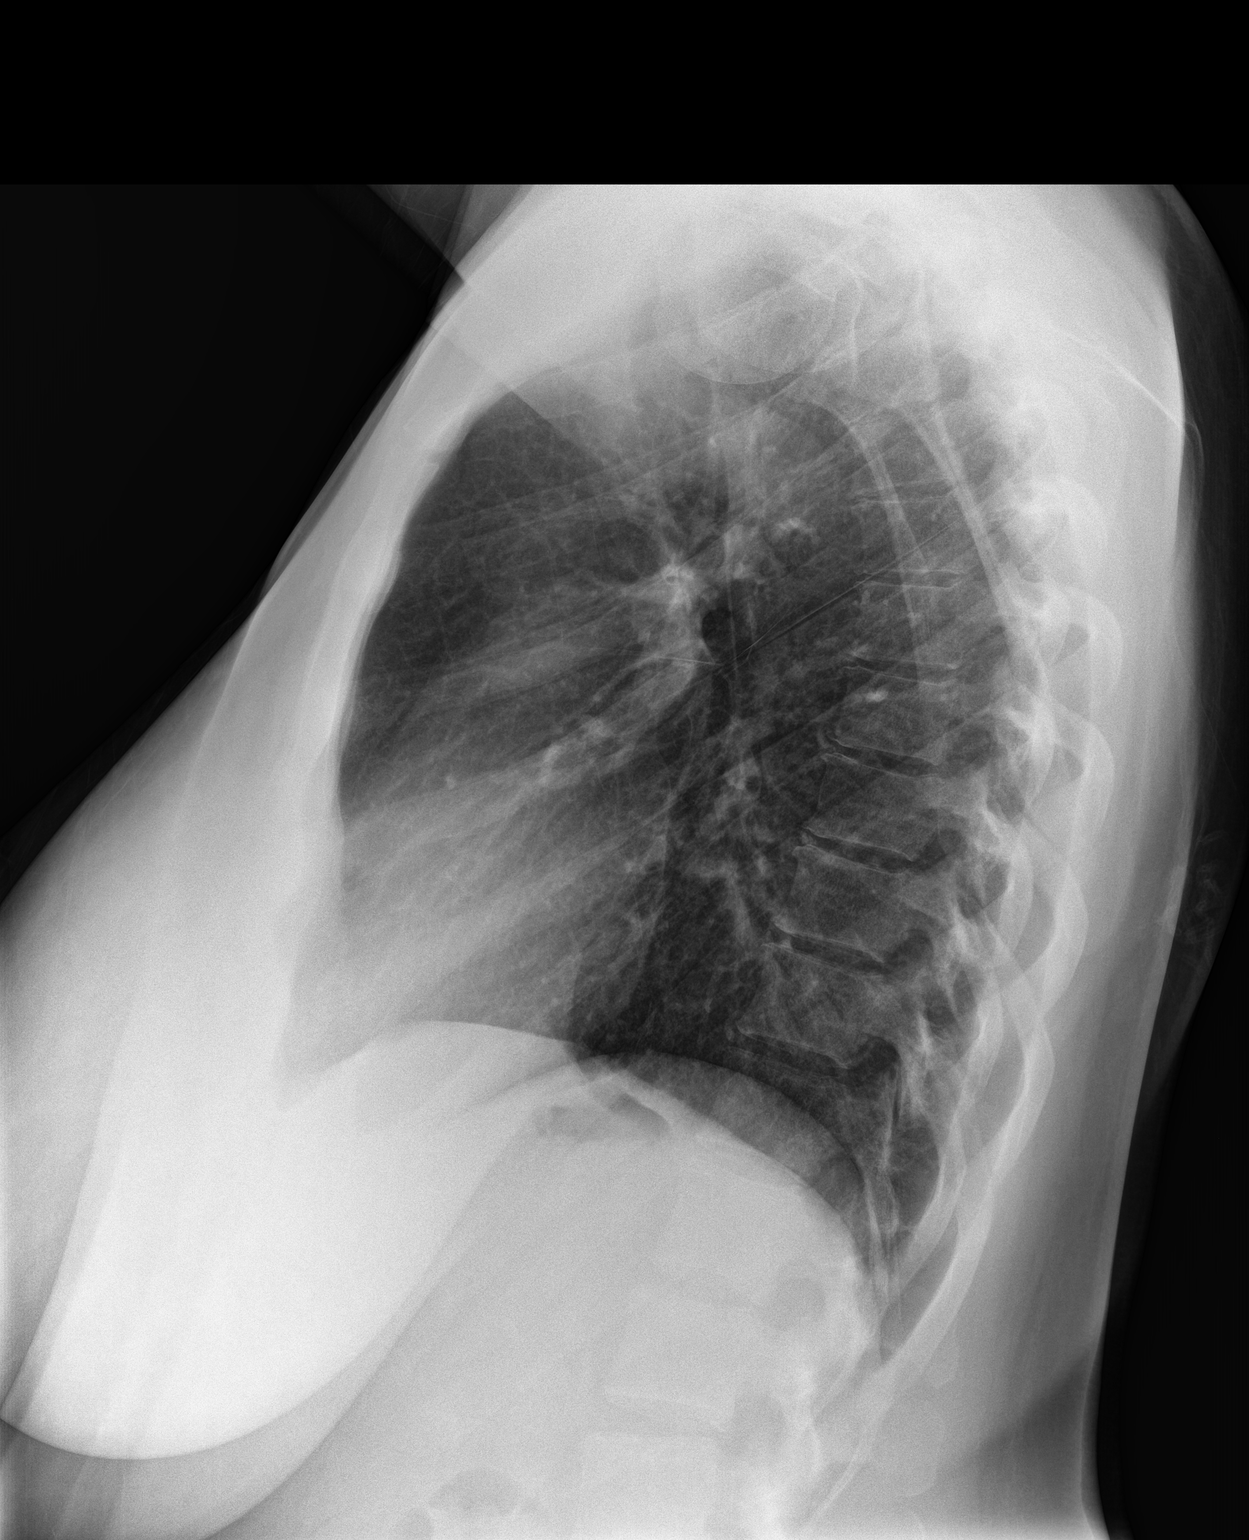

[2 of 2 positions shown; findings below may reference images not displayed]

FINDINGS: The heart size and mediastinal contours are within normal limits.
Both lungs are clear. The visualized skeletal structures are
unremarkable.
IMPRESSION: No active cardiopulmonary disease.

## 2015-08-18 ENCOUNTER — Ambulatory Visit (HOSPITAL_COMMUNITY)
Admission: RE | Admit: 2015-08-18 | Discharge: 2015-08-18 | Disposition: A | Discharge status: Home / Self Care | Payer: BLUE CROSS/BLUE SHIELD | Source: Ambulatory Visit | Attending: Obstetrics and Gynecology | Admitting: Obstetrics and Gynecology

## 2015-08-18 NOTE — Lactation Note (Signed)
Lactation Consult  Mother's reason for visit:  Sore bleeding nipples Visit Type:  feeding assessmemt Appointment Notes:  Using NS Consult:  Initial Lactation Consultant:  Marissa Gonzalez  ________________________________________________________________________  Marissa Gonzalez Name: Marissa Gonzalez Date of Birth: 08/08/2013  Gender: female Gestational Age: [redacted]w[redacted]d (At Birth) Birth Weight: 8 lb 1.1 oz (3660 g) Weight at Discharge: Weight: 7 lb 3 oz (3260 g)Date of Discharge: 08/10/2015 Filed Weights   08/08/15 1324 08/09/15 0004 08/10/15 0052  Weight: 8 lb 1.1 oz (3660 g) 7 lb 7.9 oz (3400 g) 7 lb 3 oz (3260 g)      Weight today:3392 g  7- 7.6oz ________________________________________________________________________  Mother's Name: Marissa Gonzalez Type of delivery:  C/S Breastfeeding Experience:  P2 nursed for 10 months Maternal Medical Conditions:  UTI Maternal Medications:  Antibiotics for UTI, Percocet, Motrin   ________________________________________________________________________  Breastfeeding History (Post Discharge)  Frequency of breastfeeding:  q 2-3 hours- did sleep about 7 hours last night Duration of feeding:  30-45 min or longer  Supplementation   Breastmilk:  Volume  60 ml Frequency:  occas  Method:  Bottle,   Pumping  Type of pump:  Medela pump in style Frequency:  When feeling too full and baby not emptying breast Volume:  3-5 oz  Infant Intake and Output Assessment  Voids:  6-8 in 24 hrs.  Color:  Clear yellow had void while here for appointment Stools:  2-3 in 24 hrs.  Color:  Yellow  ________________________________________________________________________  Maternal Breast Assessment  Breast:  Filling Nipple:  Flat Pain level:  4 Pain interventions:  Expressed breast milk, Hydrogel dressing and Nipple shield  _______________________________________________________________________ Feeding  Assessment/Evaluation  Initial feeding assessment:  I Tools:  Nipple shield 20 mm Instructed on use and cleaning of tool:  Yes.    Pre-feed weight:  3392  g  (7  Lb.7.6 oz.) Post-feed weight:  3404 g (7 lb. 8.1 oz.) Amount transferred: 12 ml Amount supplemented:  0 ml  Marissa Gonzalez has nursed about 1 hour before coming to appointment because he was so fussy. This is usually his nap time. Assisted mom with latch in football hold. Mom reports usually the first few minutes are very painful about a pain score of 8 out of 10. Then eases off to about 6-7. Reviewed getting the baby deep onto the breast, nose touching the breast and mom reports that feels better about a score of 4. Mom with healing scab on the tip of each nipple. Nipples pink. Area on left breast at about 8 :00 that looks slightly pink. Mom reports both breasts were very lumpy yesterday and she had Marissa Gonzalez nurse a lot to soften them, Reports they feel better since yesterday evening. Reviewed sings of mastitis and yeast and encouraged to call OB if any noted.    Pre-feed weight:  3404 g  (7 lb. 8.1 oz.) Post-feed weight:  3412 g (7. lb. 8.5 oz.) Amount transferred:  8 ml  Marissa Gonzalez very sleepy at the breast, needed much stimulation to keep him nursing. Nursed for about 15 min on left breast. Few swallows noted at beginning of feeding. Again encouraged mom to keep him close to breast.- she is letting him slide to tip of nipple. [facility] to visit on Monday. Reviewed [facility] as resource- states she came with her frist baby.No further questions at present. Offered another OP appointment but mom wants to go home and see how things go and will call us if needs another appointment  Total amount transferred:  20 ml Total supplement given:  0 ml

## 2015-11-13 ENCOUNTER — Other Ambulatory Visit: Payer: Self-pay | Admitting: Otolaryngology

## 2016-11-08 LAB — HM PAP SMEAR

## 2017-11-21 MED FILL — SULFAMETHOXAZOLE-TMP DS TAB: 800-160 | 10 days supply | Qty: 20 | Fill #0

## 2017-11-21 MED FILL — ACETAMINOPHEN/COD #3 TABLET: 300-30 | 4 days supply | Qty: 15 | Fill #0

## 2018-01-16 DIAGNOSIS — Z6831 Body mass index (BMI) 31.0-31.9, adult: Secondary | ICD-10-CM | POA: Diagnosis not present

## 2018-01-16 DIAGNOSIS — Z01419 Encounter for gynecological examination (general) (routine) without abnormal findings: Secondary | ICD-10-CM | POA: Diagnosis not present

## 2018-01-16 LAB — HM PAP SMEAR: HM PAP: NEGATIVE

## 2018-01-27 ENCOUNTER — Ambulatory Visit: Payer: Self-pay | Admitting: Family

## 2018-01-27 VITALS — BP 110/85 | Temp 98.5°F | Resp 16 | Wt 184.4 lb

## 2018-01-27 DIAGNOSIS — J029 Acute pharyngitis, unspecified: Secondary | ICD-10-CM

## 2018-01-27 DIAGNOSIS — J020 Streptococcal pharyngitis: Secondary | ICD-10-CM

## 2018-01-27 MED ORDER — AZITHROMYCIN 250 MG PO TABS: ORAL_TABLET | ORAL | 0 refills | Status: DC

## 2018-01-27 MED FILL — NORG-EE 0.18-0.215-0.25/0.0: 0.18/0.215/ | 28 days supply | Qty: 28 | Fill #0

## 2018-01-27 NOTE — Patient Instructions (Signed)
Strep Throat Strep throat is a bacterial infection of the throat. Your health care provider may call the infection tonsillitis or pharyngitis, depending on whether there is swelling in the tonsils or at the back of the throat. Strep throat is most common during the cold months of the year in children who are 5-40 years of age, but it can happen during any season in people of any age. This infection is spread from person to person (contagious) through coughing, sneezing, or close contact. What are the causes? Strep throat is caused by the bacteria called Streptococcus pyogenes. What increases the risk? This condition is more likely to develop in:  People who spend time in crowded places where the infection can spread easily.  People who have close contact with someone who has strep throat.  What are the signs or symptoms? Symptoms of this condition include:  Fever or chills.  Redness, swelling, or pain in the tonsils or throat.  Pain or difficulty when swallowing.  White or yellow spots on the tonsils or throat.  Swollen, tender glands in the neck or under the jaw.  Red rash all over the body (rare).  How is this diagnosed? This condition is diagnosed by performing a rapid strep test or by taking a swab of your throat (throat culture test). Results from a rapid strep test are usually ready in a few minutes, but throat culture test results are available after one or two days. How is this treated? This condition is treated with antibiotic medicine. Follow these instructions at home: Medicines  Take over-the-counter and prescription medicines only as told by your health care provider.  Take your antibiotic as told by your health care provider. Do not stop taking the antibiotic even if you start to feel better.  Have family members who also have a sore throat or fever tested for strep throat. They may need antibiotics if they have the strep infection. Eating and drinking  Do not  share food, drinking cups, or personal items that could cause the infection to spread to other people.  If swallowing is difficult, try eating soft foods until your sore throat feels better.  Drink enough fluid to keep your urine clear or pale yellow. General instructions  Gargle with a salt-water mixture 3-4 times per day or as needed. To make a salt-water mixture, completely dissolve -1 tsp of salt in 1 cup of warm water.  Make sure that all household members wash their hands well.  Get plenty of rest.  Stay home from school or work until you have been taking antibiotics for 24 hours.  Keep all follow-up visits as told by your health care provider. This is important. Contact a health care provider if:  The glands in your neck continue to get bigger.  You develop a rash, cough, or earache.  You cough up a thick liquid that is green, yellow-brown, or bloody.  You have pain or discomfort that does not get better with medicine.  Your problems seem to be getting worse rather than better.  You have a fever. Get help right away if:  You have new symptoms, such as vomiting, severe headache, stiff or painful neck, chest pain, or shortness of breath.  You have severe throat pain, drooling, or changes in your voice.  You have swelling of the neck, or the skin on the neck becomes red and tender.  You have signs of dehydration, such as fatigue, dry mouth, and decreased urination.  You become increasingly sleepy, or   you cannot wake up completely.  Your joints become red or painful. This information is not intended to replace advice given to you by your health care provider. Make sure you discuss any questions you have with your health care provider. Document Released: 12/06/2000 Document Revised: 08/07/2016 Document Reviewed: 04/03/2015 Elsevier Interactive Patient Education  2018 [employer]  

## 2018-01-27 NOTE — Progress Notes (Signed)
   Subjective:    Patient ID: Marissa Gonzalez, female    DOB: Nov 20, 1976, 42 y.o.   MRN: [id number]  Sore Throat   This is a new problem. The current episode started yesterday. The problem has been gradually worsening. Associated symptoms include congestion, coughing ("a little"), ear pain, headaches, a hoarse voice, swollen glands and trouble swallowing. Associated symptoms comments: Chills . She has tried acetaminophen for the symptoms. The treatment provided mild relief.      Review of Systems  HENT: Positive for congestion, ear pain, hoarse voice and trouble swallowing.   Respiratory: Positive for cough ("a little").   Neurological: Positive for headaches.  All other systems reviewed and are negative.      Objective:   Physical Exam  Constitutional: She is oriented to person, place, and time. She appears well-developed and well-nourished. No distress.  HENT:  Head: Normocephalic and atraumatic.  Right Ear: External ear normal.  Left Ear: External ear normal.  Mouth/Throat: Posterior oropharyngeal edema and posterior oropharyngeal erythema present.  Eyes: Pupils are equal, round, and reactive to light.  Neck: Normal range of motion. Neck supple. No thyromegaly present.  Cardiovascular: Normal rate, regular rhythm, normal heart sounds and intact distal pulses.  No murmur heard. Pulmonary/Chest: Effort normal and breath sounds normal. No respiratory distress. She has no wheezes.  Abdominal: Soft. Bowel sounds are normal. She exhibits no distension. There is no tenderness.  Musculoskeletal: Normal range of motion. She exhibits no edema or tenderness.  Lymphadenopathy:    She has cervical adenopathy.  Neurological: She is alert and oriented to person, place, and time.  Skin: Skin is warm and dry.  Psychiatric: She has a normal mood and affect. Her behavior is normal. Judgment and thought content normal.  Vitals reviewed.     BP 110/85 (BP Location: Right Arm, Patient  Position: Sitting, Cuff Size: Normal)   Temp 98.5 F (36.9 C) (Oral)   Resp 16   Wt 184 lb 6.4 oz (83.6 kg)   BMI 31.16 kg/m      Assessment & Plan:  1. Sore throat - Rapid Strep Screen (Not at [facility])  2. Strep throat - Take meds as prescribed - Use a cool mist humidifier  -Force fluids -For fever or aces or pains- take tylenol or ibuprofen appropriate for age and weight. -Throat lozenges if help -New toothbrush in 3 days - azithromycin (ZITHROMAX) 250 MG tablet; Take 500 mg once, then 250 mg for four days  Dispense: 6 tablet; Refill: 0    Evelina Dun, FNP

## 2018-01-29 ENCOUNTER — Telehealth: Payer: Self-pay

## 2018-01-29 NOTE — Telephone Encounter (Signed)
Pt returned call and states that she is feeling better.

## 2018-02-26 MED FILL — TRI FEMYNOR 28 TABLET: 0.18/0.215/ | 84 days supply | Qty: 84 | Fill #1

## 2018-04-17 ENCOUNTER — Encounter: Payer: Self-pay | Admitting: Physician Assistant

## 2018-04-17 ENCOUNTER — Ambulatory Visit (INDEPENDENT_AMBULATORY_CARE_PROVIDER_SITE_OTHER): Payer: 59 | Admitting: Physician Assistant

## 2018-04-17 VITALS — BP 124/88 | HR 94 | Temp 98.5°F | Ht 64.5 in | Wt 187.4 lb

## 2018-04-17 DIAGNOSIS — Z7689 Persons encountering health services in other specified circumstances: Secondary | ICD-10-CM | POA: Diagnosis not present

## 2018-04-17 DIAGNOSIS — F419 Anxiety disorder, unspecified: Secondary | ICD-10-CM | POA: Diagnosis not present

## 2018-04-17 LAB — CBC
HCT: 42.4 % (ref 36.0–46.0)
Hemoglobin: 13.7 g/dL (ref 12.0–15.0)
MCHC: 32.3 g/dL (ref 30.0–36.0)
MCV: 82.5 fl (ref 78.0–100.0)
PLATELETS: 310 10*3/uL (ref 150.0–400.0)
RBC: 5.14 Mil/uL — AB (ref 3.87–5.11)
RDW: 13.7 % (ref 11.5–15.5)
WBC: 6.8 10*3/uL (ref 4.0–10.5)

## 2018-04-17 LAB — COMPREHENSIVE METABOLIC PANEL
ALK PHOS: 27 U/L — AB (ref 39–117)
ALT: 12 U/L (ref 0–35)
AST: 13 U/L (ref 0–37)
Albumin: 3.9 g/dL (ref 3.5–5.2)
BILIRUBIN TOTAL: 0.2 mg/dL (ref 0.2–1.2)
BUN: 11 mg/dL (ref 6–23)
CALCIUM: 9.8 mg/dL (ref 8.4–10.5)
CO2: 23 meq/L (ref 19–32)
CREATININE: 0.63 mg/dL (ref 0.40–1.20)
Chloride: 103 mEq/L (ref 96–112)
GFR: 111.17 mL/min (ref >60.00)
Glucose, Bld: 91 mg/dL (ref 70–99)
Potassium: 4 mEq/L (ref 3.5–5.1)
Sodium: 138 mEq/L (ref 135–145)
TOTAL PROTEIN: 7.3 g/dL (ref 6.0–8.3)

## 2018-04-17 LAB — TSH: TSH: 1.08 u[IU]/mL (ref 0.35–4.50)

## 2018-04-17 MED ORDER — LORAZEPAM 0.5 MG PO TABS: 0.5000 mg | ORAL_TABLET | Freq: Every day | ORAL | 0 refills | Status: DC | PRN

## 2018-04-17 MED FILL — LORazepam 0.5 MG TABS: 0.5 | 30 days supply | Qty: 30 | Fill #0

## 2018-04-17 NOTE — Patient Instructions (Signed)
It was great to meet you!  Please return for a physical at your convenience, if you determine this is needed for your health insurance. Otherwise, let's follow-up in 6 months for your anxiety, sooner if you feel that is needed.

## 2018-04-17 NOTE — Assessment & Plan Note (Signed)
GAD-7 is 2 today. Overall well-controlled. No issues with anxiety. I discussed that I do not provide daily benzo prescriptions, however I would provide a script for Ativan #30 for 6 months. [place] Controlled Substance Database reviewed today regarding patient. Patient is compliant with [facility] regarding pharmacy use and one-prescribing provider. Labs today to r/o organic etiology.

## 2018-04-17 NOTE — Progress Notes (Signed)
Marissa Gonzalez is a 42 y.o. female here to Establish Care.  I acted as a Education administrator for [employer], PA-C Anselmo Pickler, LPN  History of Present Illness:   Chief Complaint  Patient presents with  . [place]  . Anxiety    Acute Concerns: Situational anxiety -- she had a miscarriage between her daughter and son and since then has had issues with anxiety. She has what she considers "low-grade panic attacks", states that his MIL and doctor offices are triggers as well as when she feels tired/irritable. Was on Prozac for 3 months when a "kid" when she was grieving. Has not been on daily medication since that time. She states that she has an old prescription of Ativan that she takes as needed. Just having it on hand has been helpful for her anxiety, per her report.  Health Maintenance: Immunizations -- up to date Colonoscopy -- N/A Mammogram -- not done PAP -- done 12/2017 Normal per pt will obtain records from OB/GYN Diet -- no beef or pork, lactose intolerant Caffeine intake -- 2 cups a day Sleep habits -- good, about 6-8 hours nightly Exercise -- does yoga and trying to get in more regular exercise Weight -- Weight: 187 lb 6.1 oz (85 kg)  Mood -- anxiety  Depression screen [facility] 2/9 04/17/2018  Decreased Interest 0  Down, Depressed, Hopeless 0  PHQ - 2 Score 0    GAD 7 : Generalized Anxiety Score 04/17/2018  Nervous, Anxious, on Edge 1  Control/stop worrying 0  Worry too much - different things 0  Trouble relaxing 0  Restless 0  Easily annoyed or irritable 1  Afraid - awful might happen 0  Total GAD 7 Score 2  Anxiety Difficulty Not difficult at all   Other providers/specialists: [facility] -- Dr. Julien Girt Eye doctor -- Dr. Rutherford Guys Dentist -- Izora Gala  Past Medical History:  Diagnosis Date  . Abnormal Pap smear   . Anxiety   . Fibroid 2012   this pregnancy  . GERD (gastroesophageal reflux disease)    pregnancy  related  . Headache(784.0)   . Heart murmur    no problems  . Hx of migraines    as teenager  . Lactose intolerance   . Sciatic nerve pain 2012   this pregnancy     Social History   Socioeconomic History  . Marital status: Married    Spouse name: Not on file  . Number of children: Not on file  . Years of education: Not on file  . Highest education level: Not on file  Occupational History  . Not on file  Social Needs  . Financial resource strain: Not on file  . Food insecurity:    Worry: Not on file    Inability: Not on file  . Transportation needs:    Medical: Not on file    Non-medical: Not on file  Tobacco Use  . Smoking status: Former Smoker    Last attempt to quit: 11/13/1999    Years since quitting: 18.4  . Smokeless tobacco: Never Used  Substance and Sexual Activity  . Alcohol use: Yes    Comment: 2 drinks per weekend beer and liquor  . Drug use: Yes    Types: Marijuana    Comment: couple times a month  . Sexual activity: Yes    Birth control/protection: Surgical    Comment: tubal ligation  Lifestyle  . Physical activity:  Days per week: Not on file    Minutes per session: Not on file  . Stress: Not on file  Relationships  . Social connections:    Talks on phone: Not on file    Gets together: Not on file    Attends religious service: Not on file    Active member of club or organization: Not on file    Attends meetings of clubs or organizations: Not on file    Relationship status: Not on file  . Intimate partner violence:    Fear of current or ex partner: Not on file    Emotionally abused: Not on file    Physically abused: Not on file    Forced sexual activity: Not on file  Other Topics Concern  . Not on file  Social History Narrative   Evelena Peat -- therapist with [employer]   42 y/o [place]   42 y/o [place]    Past Surgical History:  Procedure Laterality Date  . CESAREAN SECTION  03/10/2012   Procedure: CESAREAN SECTION;  Surgeon: Cyril Mourning, MD;x 1  Location: [facility];  Service: Gynecology;  Laterality: N/A;  . CESAREAN SECTION  08/08/2015  . CESAREAN SECTION WITH BILATERAL TUBAL LIGATION Bilateral 08/08/2015   Procedure: CESAREAN SECTION WITH Left Partial Salpingectomy, and Right Ligation of Fallopian tube with Filshie Clip.;  Surgeon: Marylynn Pearson, MD;  Location: [facility];  Service: Obstetrics;  Laterality: Bilateral;  edc 08/12/15 [name] RNFA confirmed 7/29...tms   . dental implant  1995  . NO PAST SURGERIES    . TUBAL LIGATION  08/08/2015  . WISDOM TOOTH EXTRACTION  1985    Family History  Problem Relation Age of Onset  . Cancer Maternal Grandmother   . Arthritis Maternal Grandmother   . Miscarriages / Stillbirths Maternal Grandmother   . Stroke Maternal Grandmother   . Depression Mother        per prenatal  . Hypertension Mother   . Depression Father        per prenatal  . Hearing loss Father   . Hypertension Father   . Hyperlipidemia Father   . Depression Brother   . Heart attack Maternal Grandfather   . Heart disease Maternal Grandfather   . Hypertension Maternal Grandfather   . Heart attack Paternal Grandfather   . Heart disease Paternal Grandfather     Allergies  Allergen Reactions  . Augmentin [Amoxicillin-Pot Clavulanate]   . Sulfa Antibiotics      Current Medications:   Current Outpatient Medications:  .  ibuprofen (ADVIL,MOTRIN) 600 MG tablet, Take 1 tablet (600 mg total) by mouth every 6 (six) hours., Disp: 30 tablet, Rfl: 1 .  Multiple Vitamins-Minerals (CENTRUM WOMEN PO), Take 1 tablet by mouth daily., Disp: , Rfl:  .  TRI FEMYNOR 0.18/0.215/0.25 MG-35 MCG tablet, Take 1 tablet by mouth daily. , Disp: , Rfl:  .  LORazepam (ATIVAN) 0.5 MG tablet, Take 1 tablet (0.5 mg total) by mouth daily as needed for anxiety., Disp: 30 tablet, Rfl: 0   Review of Systems:   Review of Systems  Constitutional: Negative.  Negative for chills, fever, malaise/fatigue and weight loss.  HENT:  Negative.  Negative for hearing loss, sinus pain and sore throat.   Eyes: Negative.  Negative for blurred vision.  Respiratory: Negative.  Negative for cough and shortness of breath.   Cardiovascular: Negative.  Negative for chest pain, palpitations and leg swelling.  Gastrointestinal: Negative.  Negative for abdominal pain, constipation, diarrhea, heartburn, nausea  and vomiting.  Genitourinary: Negative.  Negative for dysuria, frequency and urgency.  Musculoskeletal: Negative.  Negative for back pain, myalgias and neck pain.  Skin: Negative.  Negative for itching and rash.  Neurological: Negative.  Negative for dizziness, tingling, seizures, loss of consciousness and headaches.  Endo/Heme/Allergies: Negative.  Negative for polydipsia.  Psychiatric/Behavioral: Negative.  Negative for depression. The patient is not nervous/anxious.     Vitals:   Vitals:   04/17/18 1040  BP: 124/88  Pulse: 94  Temp: 98.5 F (36.9 C)  TempSrc: Oral  SpO2: 99%  Weight: 187 lb 6.1 oz (85 kg)  Height: 5' 4.5" (1.638 m)     Body mass index is 31.67 kg/m.  Physical Exam:   Physical Exam  Constitutional: She appears well-developed. She is cooperative.  Non-toxic appearance. She does not have a sickly appearance. She does not appear ill. No distress.  Cardiovascular: Normal rate, regular rhythm, S1 normal, S2 normal, normal heart sounds and normal pulses.  No LE edema  Pulmonary/Chest: Effort normal and breath sounds normal.  Neurological: She is alert. GCS eye subscore is 4. GCS verbal subscore is 5. GCS motor subscore is 6.  Skin: Skin is warm, dry and intact.  Psychiatric: She has a normal mood and affect. Her speech is normal and behavior is normal.  Pleasant and talkative  Nursing note and vitals reviewed.   Assessment and Plan:    Problem List Items Addressed This Visit      Other   Anxiety - Primary    GAD-7 is 2 today. Overall well-controlled. No issues with anxiety. I discussed that I  do not provide daily benzo prescriptions, however I would provide a script for Ativan #30 for 6 months. [place] Controlled Substance Database reviewed today regarding patient. Patient is compliant with [place] regarding pharmacy use and one-prescribing provider. Labs today to r/o organic etiology.        Relevant Medications   LORazepam (ATIVAN) 0.5 MG tablet   Other Relevant Orders   TSH   Comprehensive metabolic panel   CBC    Other Visit Diagnoses    Encounter to establish care          . Reviewed expectations re: course of current medical issues. . Discussed self-management of symptoms. . Outlined signs and symptoms indicating need for more acute intervention. . Patient verbalized understanding and all questions were answered. . See orders for this visit as documented in the electronic medical record. . Patient received an After-Visit Summary.   CMA or LPN served as scribe during this visit. History, Physical, and Plan performed by medical provider. Documentation and orders reviewed and attested to.   Inda Coke, PA-C

## 2018-04-24 ENCOUNTER — Ambulatory Visit (INDEPENDENT_AMBULATORY_CARE_PROVIDER_SITE_OTHER): Payer: 59 | Admitting: Physician Assistant

## 2018-04-24 ENCOUNTER — Encounter: Payer: Self-pay | Admitting: Physician Assistant

## 2018-04-24 VITALS — BP 110/70 | HR 84 | Temp 98.1°F | Ht 64.5 in | Wt 186.0 lb

## 2018-04-24 DIAGNOSIS — Z136 Encounter for screening for cardiovascular disorders: Secondary | ICD-10-CM | POA: Diagnosis not present

## 2018-04-24 DIAGNOSIS — Z13220 Encounter for screening for lipoid disorders: Secondary | ICD-10-CM

## 2018-04-24 DIAGNOSIS — E669 Obesity, unspecified: Secondary | ICD-10-CM

## 2018-04-24 DIAGNOSIS — Z0000 Encounter for general adult medical examination without abnormal findings: Secondary | ICD-10-CM

## 2018-04-24 DIAGNOSIS — F419 Anxiety disorder, unspecified: Secondary | ICD-10-CM

## 2018-04-24 LAB — LIPID PANEL
Cholesterol: 236 mg/dL — ABNORMAL HIGH (ref 0–200)
HDL: 90 mg/dL (ref >39.00)
LDL Cholesterol: 126 mg/dL — ABNORMAL HIGH (ref 0–99)
NONHDL: 145.76
Total CHOL/HDL Ratio: 3
Triglycerides: 101 mg/dL (ref 0.0–149.0)
VLDL: 20.2 mg/dL (ref 0.0–40.0)

## 2018-04-24 NOTE — Progress Notes (Signed)
I acted as a Education administrator for [employer], PA-C Anselmo Pickler, LPN   Subjective:    Marissa Gonzalez is a 42 y.o. female and is here for a comprehensive physical exam.   HPI  Health Maintenance Due  Topic Date Due  . PAP SMEAR  01/25/2018    Acute Concerns:   Chronic Issues: Anxiety --discussed at last visit.  Currently well controlled.  Takes Ativan as needed.  Labs were checked at last visit and were essentially normal.  Obesity-continue to work on diet and exercise.  She has recently joined [employer] and is now doing yoga.  She eats relatively healthy,  denies a lot of fast food.  She does not consume any sugary beverages.  Health Maintenance: Immunizations -- UTD Colonoscopy -- N/A Mammogram -- N/a PAP -- UTD done Jan 2019 waiting on records Diet -- no red meat or pork, limited dairy, lots of fruits/veggies; was eating lots of snacks at night but has stopped eating after children go to bed; no juices, sodas Caffeine intake -- 2 cups of coffee daily Sleep habits -- 11-11:30pm and wake up around 6; feels like a light sleeper Exercise -- going to the [facility] and doing zumba/yoga Weight -- Weight: 186 lb (84.4 kg)  Mood -- stable, does have anxiety that is currently well-controlled  Depression screen [facility] 2/9 04/17/2018  Decreased Interest 0  Down, Depressed, Hopeless 0  PHQ - 2 Score 0   Other providers/specialists: Ob-Gyn   PMHx, SurgHx, SocialHx, Medications, and Allergies were reviewed in the Visit Navigator and updated as appropriate.   Past Medical History:  Diagnosis Date  . Abnormal Pap smear   . Anxiety   . Fibroid 2012   this pregnancy  . GERD (gastroesophageal reflux disease)    pregnancy related  . Headache(784.0)   . Heart murmur    no problems  . Hx of migraines    as teenager  . Lactose intolerance   . Sciatic nerve pain 2012   this pregnancy     Past Surgical History:  Procedure Laterality Date  . CESAREAN SECTION  03/10/2012   Procedure: CESAREAN SECTION;  Surgeon: Cyril Mourning, MD;x 1  Location: [facility];  Service: Gynecology;  Laterality: N/A;  . CESAREAN SECTION  08/08/2015  . CESAREAN SECTION WITH BILATERAL TUBAL LIGATION Bilateral 08/08/2015   Procedure: CESAREAN SECTION WITH Left Partial Salpingectomy, and Right Ligation of Fallopian tube with Filshie Clip.;  Surgeon: Marylynn Pearson, MD;  Location: [facility];  Service: Obstetrics;  Laterality: Bilateral;  edc 08/12/15 [name] RNFA confirmed 7/29...tms   . dental implant  1995  . NO PAST SURGERIES    . TUBAL LIGATION  08/08/2015  . WISDOM TOOTH EXTRACTION  1985     Family History  Problem Relation Age of Onset  . Cancer Maternal Grandmother   . Arthritis Maternal Grandmother   . Miscarriages / Stillbirths Maternal Grandmother   . Stroke Maternal Grandmother   . Depression Mother        per prenatal  . Hypertension Mother   . Depression Father        per prenatal  . Hearing loss Father   . Hypertension Father   . Hyperlipidemia Father   . Depression Brother   . Heart attack Maternal Grandfather   . Heart disease Maternal Grandfather   . Hypertension Maternal Grandfather   . Heart attack Paternal Grandfather   . Heart disease Paternal Grandfather     Social History  Tobacco Use  . Smoking status: Former Smoker    Last attempt to quit: 11/13/1999    Years since quitting: 18.4  . Smokeless tobacco: Never Used  Substance Use Topics  . Alcohol use: Yes    Comment: 2 drinks per weekend beer and liquor  . Drug use: Yes    Types: Marijuana    Comment: couple times a month    Review of Systems:   Review of Systems  Constitutional: Negative for chills, fever, malaise/fatigue and weight loss.  HENT: Negative for hearing loss, sinus pain and sore throat.   Respiratory: Negative for cough and hemoptysis.   Cardiovascular: Negative for chest pain, palpitations, leg swelling and PND.  Gastrointestinal: Negative for abdominal pain,  constipation, diarrhea, heartburn, nausea and vomiting.  Genitourinary: Negative for dysuria, frequency and urgency.  Musculoskeletal: Negative for back pain, myalgias and neck pain.  Skin: Negative for itching and rash.  Neurological: Negative for dizziness, tingling, seizures and headaches.  Endo/Heme/Allergies: Negative for polydipsia.  Psychiatric/Behavioral: Negative for depression. The patient is not nervous/anxious.     Objective:   BP 110/70 (BP Location: Left Arm, Patient Position: Sitting, Cuff Size: Large)   Pulse 84   Temp 98.1 F (36.7 C) (Oral)   Ht 5' 4.5" (1.638 m)   Wt 186 lb (84.4 kg)   LMP 04/23/2018   SpO2 99%   BMI 31.43 kg/m  Body mass index is 31.43 kg/m.   General Appearance:    Alert, cooperative, no distress, appears stated age  Head:    Normocephalic, without obvious abnormality, atraumatic  Eyes:    PERRL, conjunctiva/corneas clear, EOM's intact, fundi    benign, both eyes  Ears:    Normal TM's and external ear canals, both ears  Nose:   Nares normal, septum midline, mucosa normal, no drainage    or sinus tenderness  Throat:   Lips, mucosa, and tongue normal; teeth and gums normal  Neck:   Supple, symmetrical, trachea midline, no adenopathy;    thyroid:  no enlargement/tenderness/nodules; no carotid   bruit or JVD  Back:     Symmetric, no curvature, ROM normal, no CVA tenderness  Lungs:     Clear to auscultation bilaterally, respirations unlabored  Chest Wall:    No tenderness or deformity   Heart:    Regular rate and rhythm, S1 and S2 normal, no murmur, rub   or gallop  Breast Exam:    Not performed -- defer to ob-gyn  Abdomen:     Soft, non-tender, bowel sounds active all four quadrants,    no masses, no organomegaly  Genitalia:    Not performed -- defer to ob-gyn  Extremities:   Extremities normal, atraumatic, no cyanosis or edema  Pulses:   2+ and symmetric all extremities  Skin:   Skin color, texture, turgor normal, no rashes or lesions    Lymph nodes:   Cervical, supraclavicular, and axillary nodes normal  Neurologic:   CNII-XII intact, normal strength, sensation and reflexes    throughout    Assessment/Plan:   Lynnett was seen today for annual exam.  Diagnoses and all orders for this visit:  Routine physical examination Today patient counseled on age appropriate routine health concerns for screening and prevention, each reviewed and up to date or declined. Immunizations reviewed and up to date or declined. Labs ordered and reviewed. Risk factors for depression reviewed and negative. Hearing function and visual acuity are intact. ADLs screened and addressed as needed. Functional ability and level of  safety reviewed and appropriate. Education, counseling and referrals performed based on assessed risks today. Patient provided with a copy of personalized plan for preventive services.  Encounter for lipid screening for cardiovascular disease -     Lipid panel  Anxiety Currently well-controlled. Follow-up in 6 months, sooner if needed.  Obesity, unspecified classification, unspecified obesity type, unspecified whether serious comorbidity present Discussed diet and exercise. She is making good efforts on these things, provided emotional support.    Well Adult Exam: Labs ordered: Yes. Patient counseling was done. See below for items discussed. Discussed the patient's BMI.  The BMI BMI is not in the acceptable range; BMI management plan is completed Follow up in 6 months. Breast cancer screening: UTD. Cervical cancer screening: UTD   Patient Counseling: [x]    Nutrition: Stressed importance of moderation in sodium/caffeine intake, saturated fat and cholesterol, caloric balance, sufficient intake of fresh fruits, vegetables, fiber, calcium, iron, and 1 mg of folate supplement per day (for females capable of pregnancy).  [x]    Stressed the importance of regular exercise.   [x]    Substance Abuse: Discussed cessation/primary  prevention of tobacco, alcohol, or other drug use; driving or other dangerous activities under the influence; availability of treatment for abuse.   [x]    Injury prevention: Discussed safety belts, safety helmets, smoke detector, smoking near bedding or upholstery.   [x]    Sexuality: Discussed sexually transmitted diseases, partner selection, use of condoms, avoidance of unintended pregnancy  and contraceptive alternatives.  [x]    Dental health: Discussed importance of regular tooth brushing, flossing, and dental visits.  [x]    Health maintenance and immunizations reviewed. Please refer to Health maintenance section.   CMA or LPN served as scribe during this visit. History, Physical, and Plan performed by medical provider. Documentation and orders reviewed and attested to.  Inda Coke, PA-C [place]

## 2018-04-24 NOTE — Patient Instructions (Signed)
It was great to see you.  We will let you know about your cholesterol results.  Health Maintenance, Female Adopting a healthy lifestyle and getting preventive care can go a long way to promote health and wellness. Talk with your health care provider about what schedule of regular examinations is right for you. This is a good chance for you to check in with your provider about disease prevention and staying healthy. In between checkups, there are plenty of things you can do on your own. Experts have done a lot of research about which lifestyle changes and preventive measures are most likely to keep you healthy. Ask your health care provider for more information. Weight and diet Eat a healthy diet  Be sure to include plenty of vegetables, fruits, low-fat dairy products, and lean protein.  Do not eat a lot of foods high in solid fats, added sugars, or salt.  Get regular exercise. This is one of the most important things you can do for your health. ? Most adults should exercise for at least 150 minutes each week. The exercise should increase your heart rate and make you sweat (moderate-intensity exercise). ? Most adults should also do strengthening exercises at least twice a week. This is in addition to the moderate-intensity exercise.  Maintain a healthy weight  Body mass index (BMI) is a measurement that can be used to identify possible weight problems. It estimates body fat based on height and weight. Your health care provider can help determine your BMI and help you achieve or maintain a healthy weight.  For females 58 years of age and older: ? A BMI below 18.5 is considered underweight. ? A BMI of 18.5 to 24.9 is normal. ? A BMI of 25 to 29.9 is considered overweight. ? A BMI of 30 and above is considered obese.  Watch levels of cholesterol and blood lipids  You should start having your blood tested for lipids and cholesterol at 40 years of age, then have this test every 5  years.  You may need to have your cholesterol levels checked more often if: ? Your lipid or cholesterol levels are high. ? You are older than 40 years of age. ? You are at high risk for heart disease.  Cancer screening Lung Cancer  Lung cancer screening is recommended for adults 12-70 years old who are at high risk for lung cancer because of a history of smoking.  A yearly low-dose CT scan of the lungs is recommended for people who: ? Currently smoke. ? Have quit within the past 15 years. ? Have at least a 30-pack-year history of smoking. A pack year is smoking an average of one pack of cigarettes a day for 1 year.  Yearly screening should continue until it has been 15 years since you quit.  Yearly screening should stop if you develop a health problem that would prevent you from having lung cancer treatment.  Breast Cancer  Practice breast self-awareness. This means understanding how your breasts normally appear and feel.  It also means doing regular breast self-exams. Let your health care provider know about any changes, no matter how small.  If you are in your 20s or 30s, you should have a clinical breast exam (CBE) by a health care provider every 1-3 years as part of a regular health exam.  If you are 63 or older, have a CBE every year. Also consider having a breast X-ray (mammogram) every year.  If you have a family history of breast  cancer, talk to your health care provider about genetic screening.  If you are at high risk for breast cancer, talk to your health care provider about having an MRI and a mammogram every year.  Breast cancer gene (BRCA) assessment is recommended for women who have family members with BRCA-related cancers. BRCA-related cancers include: ? Breast. ? Ovarian. ? Tubal. ? Peritoneal cancers.  Results of the assessment will determine the need for genetic counseling and BRCA1 and BRCA2 testing.  Cervical Cancer Your health care provider may  recommend that you be screened regularly for cancer of the pelvic organs (ovaries, uterus, and vagina). This screening involves a pelvic examination, including checking for microscopic changes to the surface of your cervix (Pap test). You may be encouraged to have this screening done every 3 years, beginning at age 82.  For women ages 16-65, health care providers may recommend pelvic exams and Pap testing every 3 years, or they may recommend the Pap and pelvic exam, combined with testing for human papilloma virus (HPV), every 5 years. Some types of HPV increase your risk of cervical cancer. Testing for HPV may also be done on women of any age with unclear Pap test results.  Other health care providers may not recommend any screening for nonpregnant women who are considered low risk for pelvic cancer and who do not have symptoms. Ask your health care provider if a screening pelvic exam is right for you.  If you have had past treatment for cervical cancer or a condition that could lead to cancer, you need Pap tests and screening for cancer for at least 20 years after your treatment. If Pap tests have been discontinued, your risk factors (such as having a new sexual partner) need to be reassessed to determine if screening should resume. Some women have medical problems that increase the chance of getting cervical cancer. In these cases, your health care provider may recommend more frequent screening and Pap tests.  Colorectal Cancer  This type of cancer can be detected and often prevented.  Routine colorectal cancer screening usually begins at 40 years of age and continues through 40 years of age.  Your health care provider may recommend screening at an earlier age if you have risk factors for colon cancer.  Your health care provider may also recommend using home test kits to check for hidden blood in the stool.  A small camera at the end of a tube can be used to examine your colon directly  (sigmoidoscopy or colonoscopy). This is done to check for the earliest forms of colorectal cancer.  Routine screening usually begins at age 35.  Direct examination of the colon should be repeated every 5-10 years through 40 years of age. However, you may need to be screened more often if early forms of precancerous polyps or small growths are found.  Skin Cancer  Check your skin from head to toe regularly.  Tell your health care provider about any new moles or changes in moles, especially if there is a change in a mole's shape or color.  Also tell your health care provider if you have a mole that is larger than the size of a pencil eraser.  Always use sunscreen. Apply sunscreen liberally and repeatedly throughout the day.  Protect yourself by wearing long sleeves, pants, a wide-brimmed hat, and sunglasses whenever you are outside.  Heart disease, diabetes, and high blood pressure  High blood pressure causes heart disease and increases the risk of stroke. High blood pressure  is more likely to develop in: ? People who have blood pressure in the high end of the normal range (130-139/85-89 mm Hg). ? People who are overweight or obese. ? People who are African American.  If you are 38-40 years of age, have your blood pressure checked every 3-5 years. If you are 62 years of age or older, have your blood pressure checked every year. You should have your blood pressure measured twice-once when you are at a hospital or clinic, and once when you are not at a hospital or clinic. Record the average of the two measurements. To check your blood pressure when you are not at a hospital or clinic, you can use: ? An automated blood pressure machine at a pharmacy. ? A home blood pressure monitor.  If you are between 51 years and 54 years old, ask your health care provider if you should take aspirin to prevent strokes.  Have regular diabetes screenings. This involves taking a blood sample to check your  fasting blood sugar level. ? If you are at a normal weight and have a low risk for diabetes, have this test once every three years after 40 years of age. ? If you are overweight and have a high risk for diabetes, consider being tested at a younger age or more often. Preventing infection Hepatitis B  If you have a higher risk for hepatitis B, you should be screened for this virus. You are considered at high risk for hepatitis B if: ? You were born in a country where hepatitis B is common. Ask your health care provider which countries are considered high risk. ? Your parents were born in a high-risk country, and you have not been immunized against hepatitis B (hepatitis B vaccine). ? You have HIV or AIDS. ? You use needles to inject street drugs. ? You live with someone who has hepatitis B. ? You have had sex with someone who has hepatitis B. ? You get hemodialysis treatment. ? You take certain medicines for conditions, including cancer, organ transplantation, and autoimmune conditions.  Hepatitis C  Blood testing is recommended for: ? Everyone born from [birth year] through 1963. ? Anyone with known risk factors for hepatitis C.  Sexually transmitted infections (STIs)  You should be screened for sexually transmitted infections (STIs) including gonorrhea and chlamydia if: ? You are sexually active and are younger than 40 years of age. ? You are older than 40 years of age and your health care provider tells you that you are at risk for this type of infection. ? Your sexual activity has changed since you were last screened and you are at an increased risk for chlamydia or gonorrhea. Ask your health care provider if you are at risk.  If you do not have HIV, but are at risk, it may be recommended that you take a prescription medicine daily to prevent HIV infection. This is called pre-exposure prophylaxis (PrEP). You are considered at risk if: ? You are sexually active and do not regularly use condoms  or know the HIV status of your partner(s). ? You take drugs by injection. ? You are sexually active with a partner who has HIV.  Talk with your health care provider about whether you are at high risk of being infected with HIV. If you choose to begin PrEP, you should first be tested for HIV. You should then be tested every 3 months for as long as you are taking PrEP. Pregnancy  If you are premenopausal and  you may become pregnant, ask your health care provider about preconception counseling.  If you may become pregnant, take 400 to 800 micrograms (mcg) of folic acid every day.  If you want to prevent pregnancy, talk to your health care provider about birth control (contraception). Osteoporosis and menopause  Osteoporosis is a disease in which the bones lose minerals and strength with aging. This can result in serious bone fractures. Your risk for osteoporosis can be identified using a bone density scan.  If you are 35 years of age or older, or if you are at risk for osteoporosis and fractures, ask your health care provider if you should be screened.  Ask your health care provider whether you should take a calcium or vitamin D supplement to lower your risk for osteoporosis.  Menopause may have certain physical symptoms and risks.  Hormone replacement therapy may reduce some of these symptoms and risks. Talk to your health care provider about whether hormone replacement therapy is right for you. Follow these instructions at home:  Schedule regular health, dental, and eye exams.  Stay current with your immunizations.  Do not use any tobacco products including cigarettes, chewing tobacco, or electronic cigarettes.  If you are pregnant, do not drink alcohol.  If you are breastfeeding, limit how much and how often you drink alcohol.  Limit alcohol intake to no more than 1 drink per day for nonpregnant women. One drink equals 12 ounces of beer, 5 ounces of wine, or 1 ounces of hard  liquor.  Do not use street drugs.  Do not share needles.  Ask your health care provider for help if you need support or information about quitting drugs.  Tell your health care provider if you often feel depressed.  Tell your health care provider if you have ever been abused or do not feel safe at home. This information is not intended to replace advice given to you by your health care provider. Make sure you discuss any questions you have with your health care provider. Document Released: 06/24/2011 Document Revised: 05/16/2016 Document Reviewed: 09/12/2015 Elsevier Interactive Patient Education  [employer].

## 2018-05-05 ENCOUNTER — Encounter: Payer: Self-pay | Admitting: Physician Assistant

## 2018-05-08 MED FILL — TRI-PREVIFEM 0.18/0.215/0.2: 0.18/0.215/ | 84 days supply | Qty: 84 | Fill #2

## 2018-05-12 DIAGNOSIS — H52203 Unspecified astigmatism, bilateral: Secondary | ICD-10-CM | POA: Diagnosis not present

## 2018-05-12 DIAGNOSIS — H5213 Myopia, bilateral: Secondary | ICD-10-CM | POA: Diagnosis not present

## 2018-08-12 MED FILL — TRI FEMYNOR 28 TABLET: 0.18/0.215/ | 84 days supply | Qty: 84 | Fill #3

## 2018-11-05 MED FILL — TRI FEMYNOR 28 TABLET: 0.18/0.215/ | 84 days supply | Qty: 84 | Fill #4

## 2018-11-17 ENCOUNTER — Ambulatory Visit: Payer: Self-pay | Admitting: Obstetrics and Gynecology

## 2018-11-17 VITALS — BP 120/80 | HR 92 | Temp 99.0°F | Resp 18 | Wt 187.2 lb

## 2018-11-17 DIAGNOSIS — J069 Acute upper respiratory infection, unspecified: Secondary | ICD-10-CM | POA: Insufficient documentation

## 2018-11-17 DIAGNOSIS — J029 Acute pharyngitis, unspecified: Secondary | ICD-10-CM | POA: Insufficient documentation

## 2018-11-17 DIAGNOSIS — J040 Acute laryngitis: Secondary | ICD-10-CM

## 2018-11-17 LAB — POCT INFLUENZA A/B
INFLUENZA A, POC: NEGATIVE
Influenza B, POC: NEGATIVE

## 2018-11-17 MED ORDER — LIDOCAINE VISCOUS HCL 2 % MT SOLN: 5.0000 mL | OROMUCOSAL | 0 refills | Status: DC | PRN

## 2018-11-17 MED ORDER — PREDNISONE 20 MG PO TABS: 40.0000 mg | ORAL_TABLET | Freq: Every day | ORAL | 0 refills | Status: AC

## 2018-11-17 MED ORDER — BENZONATATE 200 MG PO CAPS: 200.0000 mg | ORAL_CAPSULE | Freq: Three times a day (TID) | ORAL | 0 refills | Status: DC | PRN

## 2018-11-17 MED FILL — LIDOCAINE 2% VISCOUS SOLN: 2 | 3 days supply | Qty: 100 | Fill #0

## 2018-11-17 MED FILL — BENZONATATE 200 MG CAPS: 200 | 10 days supply | Qty: 30 | Fill #0

## 2018-11-17 MED FILL — predniSONE 20 MG TABS: 20 | 5 days supply | Qty: 10 | Fill #0

## 2018-11-17 NOTE — Progress Notes (Signed)
  Subjective:     Patient ID: Marissa Gonzalez, female   DOB: [birth year], 42 y.o.   MRN: [id number]  HPI  Marissa Gonzalez is a 42 y.o. female here with sore throat, sinus pressure, and head congestion. Symptoms started on Friday. Says her whole family has past it back and fourth. She has been laying around for the past 2 days and has missed work due to the symptoms. No documented fever however has had hot and cold spells. She has been taking OTC medications for cold which is helping some. States her throat is bothering her a lot today. Says she woke up yesterday morning and had lost her voice.   Review of Systems  Constitutional: Positive for chills and fatigue. Negative for fever.  HENT: Positive for congestion, sinus pressure, sinus pain, sore throat, trouble swallowing and voice change.   Respiratory: Negative for cough, shortness of breath and wheezing.   Neurological: Positive for headaches.   Objective:   Physical Exam  Constitutional: She appears well-developed and well-nourished.  Non-toxic appearance. She has a sickly appearance. She appears ill. No distress.  HENT:  Head: Normocephalic.  Right Ear: Hearing normal. A middle ear effusion is present.  Left Ear: Hearing normal. A middle ear effusion is present.  Nose: Right sinus exhibits maxillary sinus tenderness and frontal sinus tenderness. Left sinus exhibits maxillary sinus tenderness and frontal sinus tenderness.  Mouth/Throat: Uvula is midline and mucous membranes are normal. Posterior oropharyngeal erythema present. No oropharyngeal exudate, posterior oropharyngeal edema or tonsillar abscesses.   Assessment:   1. Sore throat   2. URI, acute   3. Laryngitis, acute    Plan:   - URI symptoms X 5 days with sick contacts; no fever - Rx: Prednisone for laryngitis, viscous lidocaine, tessalon perles - Continue increase rest and fluid - Zinc/ Vitamin C  - Cool mist humidifier - Return for f/u if symptoms are not  improving in 7-10 days  Noni Saupe I, NP 11/17/2018 1:43 PM

## 2018-12-10 ENCOUNTER — Telehealth: Payer: 59 | Admitting: Family Medicine

## 2018-12-10 DIAGNOSIS — J329 Chronic sinusitis, unspecified: Secondary | ICD-10-CM | POA: Diagnosis not present

## 2018-12-10 MED ORDER — DOXYCYCLINE HYCLATE 100 MG PO TABS: 100.0000 mg | ORAL_TABLET | Freq: Two times a day (BID) | ORAL | 0 refills | Status: DC

## 2018-12-10 MED FILL — DOXYCYCLINE HYCLATE 100 MG: 100 | 10 days supply | Qty: 20 | Fill #0

## 2018-12-10 NOTE — Progress Notes (Signed)
No- Doxycycline is not a penicillin and usually well tolerated.

## 2018-12-10 NOTE — Progress Notes (Signed)
We are sorry that you are not feeling well.  Here is how we plan to help!  Based on what you have shared with me it looks like you have sinusitis.  Sinusitis is inflammation and infection in the sinus cavities of the head.  Based on your presentation I believe you most likely have Acute Bacterial Sinusitis.  This is an infection caused by bacteria and is treated with antibiotics. I have prescribed Doxycycline 100mg  by mouth twice a day for 10 days. You may use an oral decongestant such as Mucinex D or if you have glaucoma or high blood pressure use plain Mucinex. Saline nasal spray help and can safely be used as often as needed for congestion.  If you develop worsening sinus pain, fever or notice severe headache and vision changes, or if symptoms are not better after completion of antibiotic, please schedule an appointment with a health care provider.    The supportive treatments that you are taking the Pseudoephedrine, Motrin and throat lozenges are great supportive care during a sinus infection with congestion symptoms. It is a good idea to continue these.   Sinus infections are not as easily transmitted as other respiratory infection, however we still recommend that you avoid close contact with loved ones, especially the very young and elderly.  Remember to wash your hands thoroughly throughout the day as this is the number one way to prevent the spread of infection!  Home Care:  Only take medications as instructed by your medical team.  Complete the entire course of an antibiotic.  Do not take these medications with alcohol.  A steam or ultrasonic humidifier can help congestion.  You can place a towel over your head and breathe in the steam from hot water coming from a faucet.  Avoid close contacts especially the very young and the elderly.  Cover your mouth when you cough or sneeze.  Always remember to wash your hands.  Get Help Right Away If:  You develop worsening fever or sinus  pain.  You develop a severe head ache or visual changes.  Your symptoms persist after you have completed your treatment plan.  Make sure you  Understand these instructions.  Will watch your condition.  Will get help right away if you are not doing well or get worse.  Your e-visit answers were reviewed by a board certified advanced clinical practitioner to complete your personal care plan.  Depending on the condition, your plan could have included both over the counter or prescription medications.  If there is a problem please reply  once you have received a response from your provider.  Your safety is important to [place].  If you have drug allergies check your prescription carefully.    You can use MyChart to ask questions about today's visit, request a non-urgent call back, or ask for a work or school excuse for 24 hours related to this e-Visit. If it has been greater than 24 hours you will need to follow up with your provider, or enter a new e-Visit to address those concerns.  You will get an e-mail in the next two days asking about your experience.  I hope that your e-visit has been valuable and will speed your recovery. Thank you for using e-visits.

## 2019-02-12 MED FILL — NITROFURANTOIN MONO-MCR 100: 100 | 7 days supply | Qty: 14 | Fill #0

## 2019-02-12 MED FILL — TRI FEMYNOR 28 TABLET: 0.18/0.215/ | 28 days supply | Qty: 28 | Fill #0

## 2019-02-18 ENCOUNTER — Encounter: Payer: Self-pay | Admitting: Physician Assistant

## 2019-02-18 ENCOUNTER — Telehealth: Payer: Self-pay

## 2019-02-18 ENCOUNTER — Ambulatory Visit: Payer: Self-pay | Admitting: Physician Assistant

## 2019-02-18 VITALS — BP 105/75 | HR 92 | Temp 98.2°F | Resp 16 | Wt 190.0 lb

## 2019-02-18 DIAGNOSIS — J029 Acute pharyngitis, unspecified: Secondary | ICD-10-CM

## 2019-02-18 LAB — POCT RAPID STREP A (OFFICE): RAPID STREP A SCREEN: NEGATIVE

## 2019-02-18 MED ORDER — AZITHROMYCIN 250 MG PO TABS: ORAL_TABLET | ORAL | 0 refills | Status: DC

## 2019-02-18 MED FILL — AZITHROMYCIN 250 MG TABLET: 250 | 5 days supply | Qty: 6 | Fill #0

## 2019-02-18 NOTE — Progress Notes (Addendum)
MRN: [id number] DOB: December 23, 1976  Subjective:   Marissa Gonzalez is a 43 y.o. female presenting for chief complaint of Sore Throat (X 2 DAYS, SWOLLEN GLANDS) and Ear Pain (X 2 DAYS ) .  Reports 2 day hx of sore throat, pain with swallowing, and swollen lymph nodes. Has some ear fullness. Has felt feverish and body aches especially the first day. Did not check temp.Notes this is the worst her sore throat as felt in a very long time. Denies drooling, sinus pain, rhinorrhea, inability to swallow, voice change, dry cough, productive cough, wheezing, shortness of breath and chest pain, nausea, vomiting, abdominal pain and diarrhea. Has tried ibuprofen with no full relief. Taking it every 4 hours for pain relief. No known sick contact exposure.Has history of seasonal allergies. Denies history of asthma, DM, and HTN. Patient has had flu shot this season. Denies smoking. Denies recnet oral sex exposure. Denies hx of mono.  Denies any other aggravating or relieving factors, no other questions or concerns.  Review of Systems  Constitutional: Negative for diaphoresis.  Respiratory: Negative for stridor.   Skin: Negative for rash.  Neurological: Negative for dizziness.    Marissa Gonzalez has a current medication list which includes the following prescription(s): ibuprofen, lorazepam, nitrofurantoin (macrocrystal-monohydrate), tri femynor, benzonatate, doxycycline, lidocaine, and multiple vitamins-minerals. Also is allergic to augmentin [amoxicillin-pot clavulanate] and sulfa antibiotics.  Marissa Gonzalez  has a past medical history of Abnormal Pap smear, Anxiety, Fibroid (2012), GERD (gastroesophageal reflux disease), Headache(784.0), Heart murmur, migraines, Lactose intolerance, and Sciatic nerve pain (2012). Also  has a past surgical history that includes Wisdom tooth extraction (1985); dental implant (1995); No past surgeries; Cesarean section with bilateral tubal ligation (Bilateral, 08/08/2015); Tubal ligation  (08/08/2015); Cesarean section (03/10/2012); and Cesarean section (08/08/2015).   Objective:   Vitals: BP 105/75 (BP Location: Right Arm, Patient Position: Sitting, Cuff Size: Normal)   Pulse 92   Temp 98.2 F (36.8 C) (Oral)   Resp 16   Wt 190 lb (86.2 kg)   SpO2 96%   BMI 32.11 kg/m   Physical Exam Vitals signs reviewed.  Constitutional:      General: She is not in acute distress.    Appearance: She is well-developed. She is not ill-appearing or toxic-appearing.  HENT:     Head: Normocephalic and atraumatic.     Right Ear: Ear canal and external ear normal. A middle ear effusion is present. Tympanic membrane is not erythematous or bulging.     Left Ear: Ear canal and external ear normal. A middle ear effusion is present. Tympanic membrane is not erythematous or bulging.     Nose: Nose normal.     Mouth/Throat:     Lips: Pink.     Mouth: Mucous membranes are moist.     Pharynx: Posterior oropharyngeal erythema present. No pharyngeal swelling, oropharyngeal exudate or uvula swelling.     Tonsils: Tonsillar exudate (b/l tonsils w/ exudate) present. No tonsillar abscesses. Swelling: 3+ on the right. 3+ on the left.     Comments: B/l tonsils are erythematous.  Eyes:     Conjunctiva/sclera: Conjunctivae normal.  Neck:     Musculoskeletal: Full passive range of motion without pain and normal range of motion. No edema or neck rigidity.     Trachea: Phonation normal. No abnormal tracheal secretions or tracheal deviation.  Pulmonary:     Effort: Pulmonary effort is normal.  Lymphadenopathy:     Head:     Right side of head: No submental, submandibular, tonsillar, preauricular,  posterior auricular or occipital adenopathy.     Left side of head: No submental, submandibular, tonsillar, preauricular, posterior auricular or occipital adenopathy.     Cervical: Cervical adenopathy present.     Right cervical: Superficial cervical adenopathy present.     Left cervical: Superficial cervical  adenopathy present.     Upper Body:     Right upper body: No supraclavicular adenopathy.     Left upper body: No supraclavicular adenopathy.  Skin:    General: Skin is warm and dry.  Neurological:     Mental Status: She is alert and oriented to person, place, and time.     Results for orders placed or performed in visit on 02/18/19 (from the past 24 hour(s))  POCT rapid strep A     Status: Normal   Collection Time: 02/18/19  2:30 PM  Result Value Ref Range   Rapid Strep A Screen Negative Negative   Centor score of 3-4 (depending on temperature without use of antipyretics).  FeverPAIN score of 4.   Assessment and Plan :  1. Pharyngitis, unspecified etiology Pt overall well appearing, NAD. VSS. She is afebrile w/ recent use of antipyretics.  Due to history, PE findings, and centor score/FeverPAIN socre rec treating empirically for strep at this time despite negative rapid POC test. Due to pcn allergy, given Rx fo azithromycin. Encouraged continuing OTC ibuprofen and tylenol as prescribed.  Explained to pt that we do have the resources to obtain strep cx in our office. No nuchal rigidity, peritonsillar erythema or edema, drooling, stridor,hot potato voice, or signs of respiratory distressnoted on exam to suggest more concerning etiology of sore throat such as epiglottitis, peritonsillar abscess, retropharyngeal abscess. Advised to f/u with family doctor or urgent care if no improvement w/ tx plan. Seek care sooner if sx worsen/develop new concerning sx. also encouraged pt to f/u with family doctor if this reoccurs as it would be beneficial for a strep culture to be obtained at that point. Pt voices understanding.  - POCT rapid strep A - azithromycin (ZITHROMAX) 250 MG tablet; Take 2 tabs PO x 1 dose, then 1 tab PO QD x 4 days  Dispense: 6 tablet; Refill: [facility] 02/18/2019 2:30 PM

## 2019-02-18 NOTE — Patient Instructions (Addendum)
Pharyngitis  -Complete antibiotic as prescribed. - You may take ibuprofen 600-800 mg with food for your throat every 8 hours. Tylenol can also be used for sore throat. - You may also use 2 tablespoons of warm honey every 4-6 hours to coat and soothe your throat. - Drink plenty of water, at least 64 ounces daily and rest to make sure your body has a chance to get better. -If no improvement, follow up with family doctor. If any symptoms worsen or you develop new concerning symptoms, seek care sooner.  -If this reoccurs in the future, I would recommend going to family doctor office to have culture.     Pharyngitis is a sore throat (pharynx). This is when there is redness, pain, and swelling in your throat. Most of the time, this condition gets better on its own. In some cases, you may need medicine. Follow these instructions at home:  Take over-the-counter and prescription medicines only as told by your doctor. ? If you were prescribed an antibiotic medicine, take it as told by your doctor. Do not stop taking the antibiotic even if you start to feel better. ? Do not give children aspirin. Aspirin has been linked to Reye syndrome.  Drink enough water and fluids to keep your pee (urine) clear or pale yellow.  Get a lot of rest.  Rinse your mouth (gargle) with a salt-water mixture 3-4 times a day or as needed. To make a salt-water mixture, completely dissolve -1 tsp of salt in 1 cup of warm water.  If your doctor approves, you may use throat lozenges or sprays to soothe your throat. Contact a doctor if:  You have large, tender lumps in your neck.  You have a rash.  You cough up green, yellow-brown, or bloody spit. Get help right away if:  You have a stiff neck.  You drool or cannot swallow liquids.  You cannot drink or take medicines without throwing up.  You have very bad pain that does not go away with medicine.  You have problems breathing, and it is not from a stuffy  nose.  You have new pain and swelling in your knees, ankles, wrists, or elbows. Summary  Pharyngitis is a sore throat (pharynx). This is when there is redness, pain, and swelling in your throat.  If you were prescribed an antibiotic medicine, take it as told by your doctor. Do not stop taking the antibiotic even if you start to feel better.  Most of the time, pharyngitis gets better on its own. Sometimes, you may need medicine. This information is not intended to replace advice given to you by your health care provider. Make sure you discuss any questions you have with your health care provider. Document Released: 05/27/2008 Document Revised: 01/14/2017 Document Reviewed: 01/14/2017 Elsevier Interactive Patient Education  2019 [employer].

## 2019-02-22 NOTE — Telephone Encounter (Signed)
Patient states she is doing great she feels alive again, and have no question or concern

## 2019-02-26 DIAGNOSIS — Z1231 Encounter for screening mammogram for malignant neoplasm of breast: Secondary | ICD-10-CM | POA: Diagnosis not present

## 2019-02-26 DIAGNOSIS — Z01419 Encounter for gynecological examination (general) (routine) without abnormal findings: Secondary | ICD-10-CM | POA: Diagnosis not present

## 2019-02-26 DIAGNOSIS — Z6830 Body mass index (BMI) 30.0-30.9, adult: Secondary | ICD-10-CM | POA: Diagnosis not present

## 2019-02-26 DIAGNOSIS — Z803 Family history of malignant neoplasm of breast: Secondary | ICD-10-CM | POA: Diagnosis not present

## 2019-02-26 DIAGNOSIS — Z8042 Family history of malignant neoplasm of prostate: Secondary | ICD-10-CM | POA: Diagnosis not present

## 2019-03-01 LAB — HM MAMMOGRAPHY

## 2019-03-23 MED FILL — TRI FEMYNOR 28 TABLET: 0.18/0.215/ | 28 days supply | Qty: 28 | Fill #0

## 2019-04-01 ENCOUNTER — Other Ambulatory Visit: Payer: Self-pay | Admitting: *Deleted

## 2019-04-09 DIAGNOSIS — Z809 Family history of malignant neoplasm, unspecified: Secondary | ICD-10-CM | POA: Diagnosis not present

## 2019-04-09 DIAGNOSIS — Z9189 Other specified personal risk factors, not elsewhere classified: Secondary | ICD-10-CM | POA: Diagnosis not present

## 2019-04-14 ENCOUNTER — Other Ambulatory Visit (HOSPITAL_COMMUNITY): Payer: Self-pay | Admitting: Obstetrics and Gynecology

## 2019-04-14 DIAGNOSIS — Z9189 Other specified personal risk factors, not elsewhere classified: Secondary | ICD-10-CM

## 2019-04-16 ENCOUNTER — Ambulatory Visit (INDEPENDENT_AMBULATORY_CARE_PROVIDER_SITE_OTHER): Payer: 59 | Admitting: Physician Assistant

## 2019-04-16 ENCOUNTER — Encounter: Payer: Self-pay | Admitting: Physician Assistant

## 2019-04-16 DIAGNOSIS — F419 Anxiety disorder, unspecified: Secondary | ICD-10-CM

## 2019-04-16 MED ORDER — LORAZEPAM 0.5 MG PO TABS: 0.5000 mg | ORAL_TABLET | Freq: Every day | ORAL | 1 refills | Status: DC | PRN

## 2019-04-16 MED ORDER — SERTRALINE HCL 25 MG PO TABS: 25.0000 mg | ORAL_TABLET | Freq: Every day | ORAL | 0 refills | Status: DC

## 2019-04-16 MED FILL — LORazepam 0.5 MG TABS: 0.5 | 30 days supply | Qty: 30 | Fill #0

## 2019-04-16 MED FILL — SERTRALINE HCL 25 MG TABLET: 25 | 90 days supply | Qty: 90 | Fill #0

## 2019-04-16 NOTE — Progress Notes (Signed)
Virtual Visit via Video   I connected with Marissa Gonzalez on 04/16/19 at  9:00 AM EDT by a video enabled telemedicine application and verified that I am speaking with the correct person using two identifiers. Location patient: Home Location provider:  [facility] Office Persons participating in the virtual visit: Marissa Gonzalez, Inda Coke, [place]   I discussed the limitations of evaluation and management by telemedicine and the availability of in person appointments. The patient expressed understanding and agreed to proceed.  Subjective:   HPI:  Anxiety Patient reports increased anxiety with COVID-19 pandemic. She is working from home. 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 She has not had to reduce hours or pay. She feels anxious/on edge much more often than normal. Trying to not take her ativan daily. She does feel like she is at the point where she should be on a daily medication, such as Zoloft. Was on Prozac when she was much younger but didn't like the way that it made her feel. Denies SI/HI.   GAD 7 : Generalized Anxiety Score 04/16/2019 04/17/2018  Nervous, Anxious, on Edge 1 1  Control/stop worrying 1 0  Worry too much - different things 1 0  Trouble relaxing 1 0  Restless 3 0  Easily annoyed or irritable 3 1  Afraid - awful might happen 0 0  Total GAD 7 Score 10 2  Anxiety Difficulty Somewhat difficult Not difficult at all      ROS: See pertinent positives and negatives per HPI.  Patient Active Problem List   Diagnosis Date Noted  . URI, acute 11/17/2018  . Laryngitis, acute 11/17/2018  . Sore throat 11/17/2018  . Anxiety 04/17/2018  . S/P cesarean section 08/08/2015  . IUFD (intrauterine fetal death) 27-Jun-2014    Social History   Tobacco Use  . Smoking status: Former Smoker    Last attempt to quit: 11/13/1999    Years since quitting: 19.4  . Smokeless tobacco: Never Used  Substance Use Topics  . Alcohol use: Yes    Comment: 2  drinks per weekend beer and liquor    Current Outpatient Medications:  .  ibuprofen (ADVIL,MOTRIN) 600 MG tablet, Take 1 tablet (600 mg total) by mouth every 6 (six) hours., Disp: 30 tablet, Rfl: 1 .  loratadine (CLARITIN) 10 MG tablet, Take 10 mg by mouth daily., Disp: , Rfl:  .  LORazepam (ATIVAN) 0.5 MG tablet, Take 1 tablet (0.5 mg total) by mouth daily as needed for anxiety., Disp: 30 tablet, Rfl: 1 .  Multiple Vitamins-Minerals (CENTRUM WOMEN PO), Take 1 tablet by mouth daily., Disp: , Rfl:  .  TRI FEMYNOR 0.18/0.215/0.25 MG-35 MCG tablet, Take 1 tablet by mouth daily. , Disp: , Rfl:  .  sertraline (ZOLOFT) 25 MG tablet, Take 1 tablet (25 mg total) by mouth daily., Disp: 90 tablet, Rfl: 0  Allergies  Allergen Reactions  . Augmentin [Amoxicillin-Pot Clavulanate]   . Sulfa Antibiotics     Objective:   VITALS: Per patient if applicable, see vitals. GENERAL: Alert, appears well and in no acute distress. HEENT: Atraumatic, conjunctiva clear, no obvious abnormalities on inspection of external nose and ears. NECK: Normal movements of the head and neck. CARDIOPULMONARY: No increased WOB. Speaking in clear sentences. I:E ratio WNL.  MS: Moves all visible extremities without noticeable abnormality. PSYCH: Pleasant and cooperative, well-groomed. Speech normal rate and rhythm. Affect is appropriate. Insight and judgement are appropriate. Attention is focused, linear, and appropriate.  NEURO: CN grossly  intact. Oriented as arrived to appointment on time with no prompting. Moves both UE equally.  SKIN: No obvious lesions, wounds, erythema, or cyanosis noted on face or hands.  Assessment and Plan:   [name] was seen today for anxiety.  Diagnoses and all orders for this visit:  Anxiety  Other orders -     sertraline (ZOLOFT) 25 MG tablet; Take 1 tablet (25 mg total) by mouth daily. -     LORazepam (ATIVAN) 0.5 MG tablet; Take 1 tablet (0.5 mg total) by mouth daily as needed for anxiety.    Uncontrolled. Start Zoloft 25 mg daily. Refill ativan. Follow-up in 4-6 weeks, sooner if needed.  . Reviewed expectations re: course of current medical issues. . Discussed self-management of symptoms. . Outlined signs and symptoms indicating need for more acute intervention. . Patient verbalized understanding and all questions were answered. Marland Kitchen Health Maintenance issues including appropriate healthy diet, exercise, and smoking avoidance were discussed with patient. . See orders for this visit as documented in the electronic medical record.  I discussed the assessment and treatment plan with the patient. The patient was provided an opportunity to ask questions and all were answered. The patient agreed with the plan and demonstrated an understanding of the instructions.   The patient was advised to call back or seek an in-person evaluation if the symptoms worsen or if the condition fails to improve as anticipated.    [place], [place] 04/16/2019

## 2019-04-23 ENCOUNTER — Encounter: Payer: Self-pay | Admitting: Physician Assistant

## 2019-04-23 MED FILL — TRI FEMYNOR 28 TABLET: 0.18/0.215/ | 84 days supply | Qty: 84 | Fill #0

## 2019-05-24 ENCOUNTER — Other Ambulatory Visit: Payer: Self-pay

## 2019-05-24 ENCOUNTER — Ambulatory Visit (HOSPITAL_COMMUNITY)
Admission: RE | Admit: 2019-05-24 | Discharge: 2019-05-24 | Disposition: A | Discharge status: Home / Self Care | Payer: 59 | Source: Ambulatory Visit | Attending: Obstetrics and Gynecology | Admitting: Obstetrics and Gynecology

## 2019-05-24 DIAGNOSIS — Z1501 Genetic susceptibility to malignant neoplasm of breast: Secondary | ICD-10-CM | POA: Diagnosis not present

## 2019-05-24 DIAGNOSIS — Z9189 Other specified personal risk factors, not elsewhere classified: Secondary | ICD-10-CM | POA: Insufficient documentation

## 2019-05-24 IMAGING — MR MR BILATERAL BREAST WITHOUT AND WITH CONTRAST
5 of 12 series · 19 of 48 positions shown · IV contrast (gadavist)
Comparison: [DATE] mammogram

CLINICAL DATA: 43-year-old female with high lifetime risk of
developing breast cancer due to genetic factors.

LABS:  None
EXAM:
BILATERAL BREAST MRI WITH AND WITHOUT CONTRAST
TECHNIQUE: Multiplanar, multisequence MR images of both breasts were obtained
prior to and following the intravenous administration of 10 ml of
Gadavist

[Series 3: T1 · axial · non-contrast · 2.0mm · 0.70mm/px · z∈[-120,+127]mm · 5 of 248 slices shown]
[im 1/248]
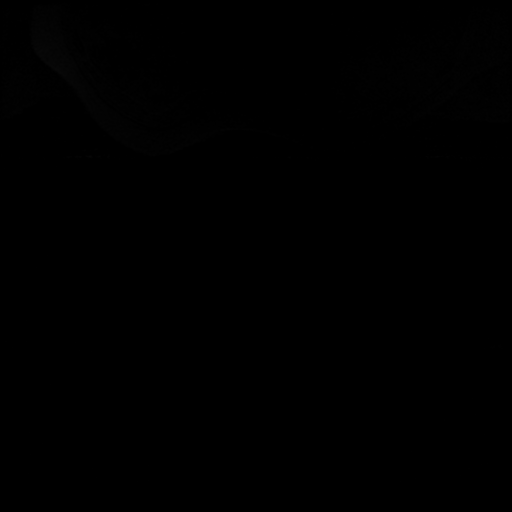
[im 62/248]
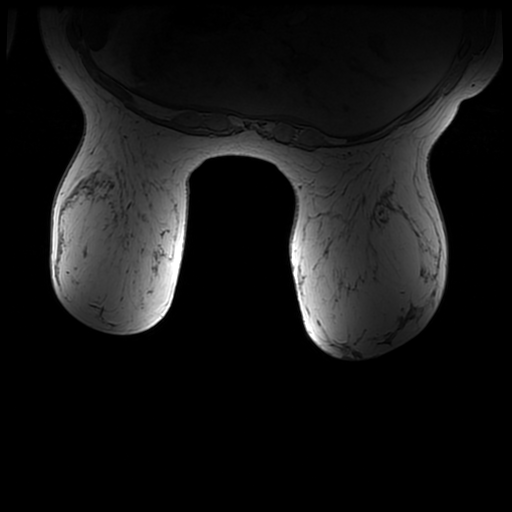
[im 124/248]
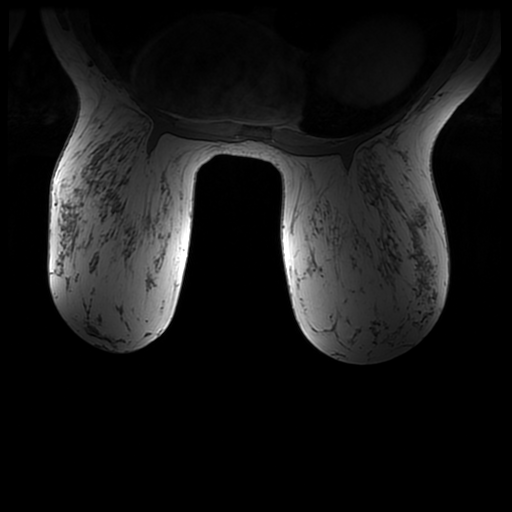
[im 186/248]
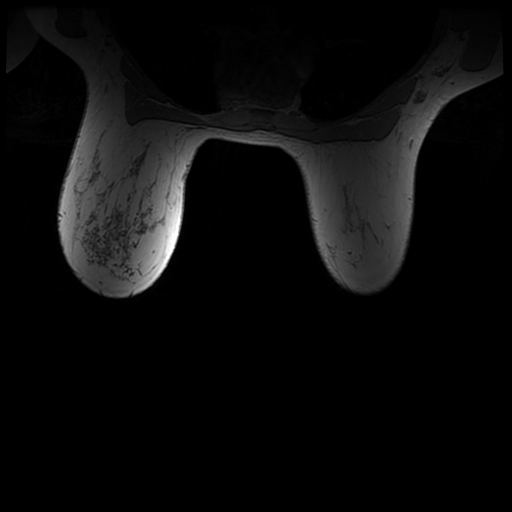
[im 248/248]
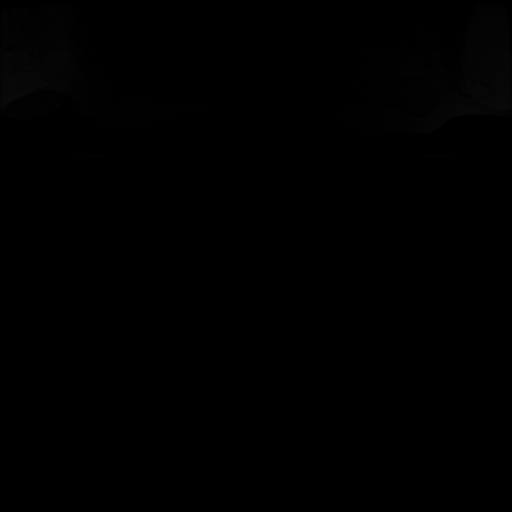

[Series 4: T2 · axial · 3.0mm · 0.70mm/px · 1 of 83 slices shown]
[im 1/83]
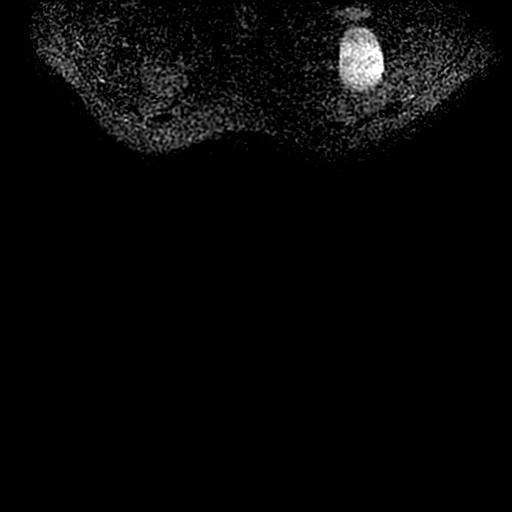

[Series 500: T1 fat-sat · axial · 2.0mm · 0.70mm/px · z∈[-120,+127]mm · 5 of 248 slices shown (1 of 3)]
[im 1/248]
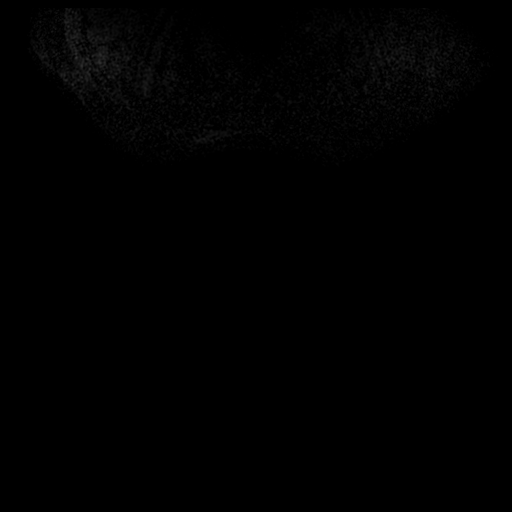
[im 62/248]
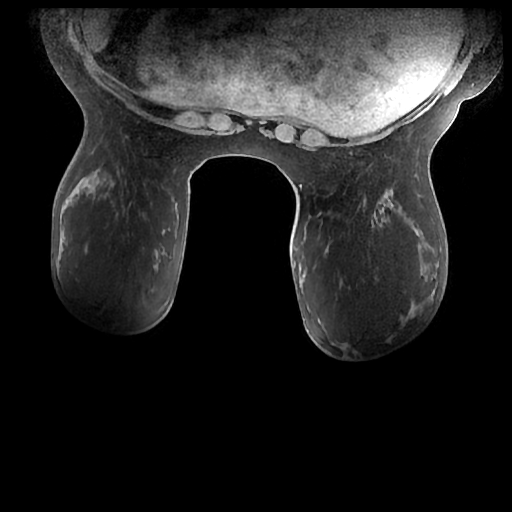
[im 124/248]
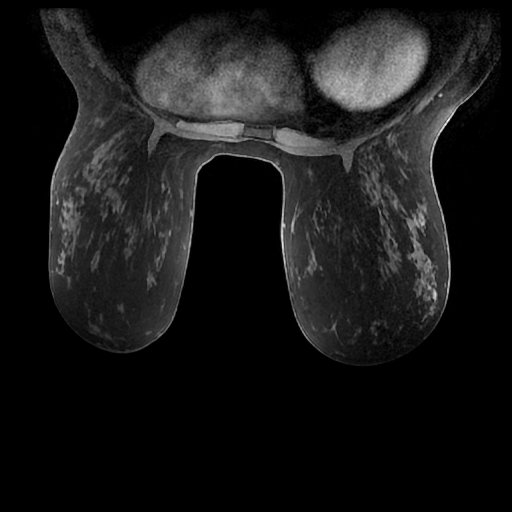
[im 186/248]
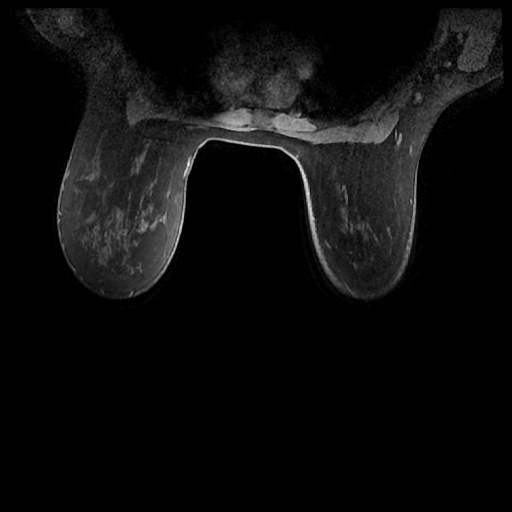
[im 248/248]
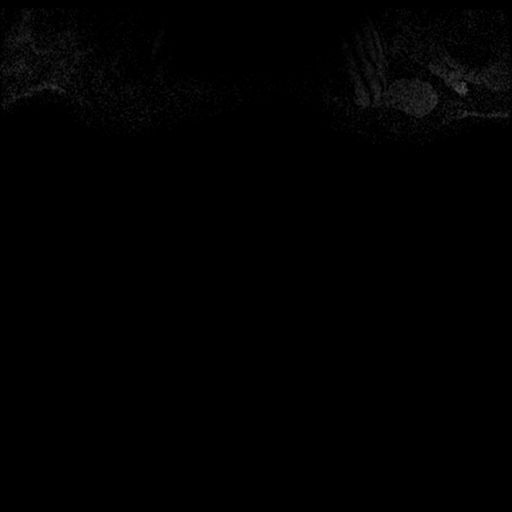

[Series 501: T1 fat-sat · axial · 2.0mm · 0.70mm/px · z∈[-120,+127]mm · 6 of 248 slices shown (2 of 3)]
[im 1/248]
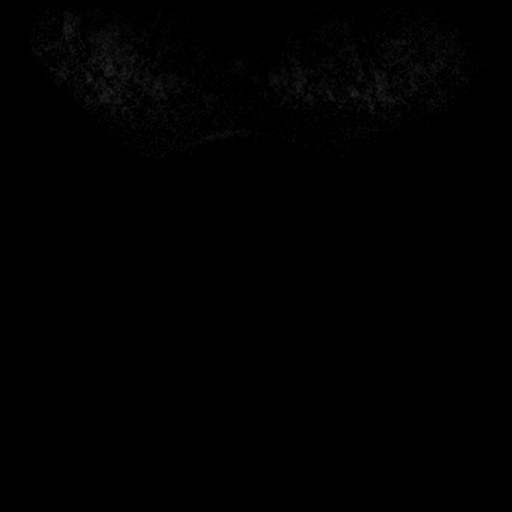
[im 50/248]
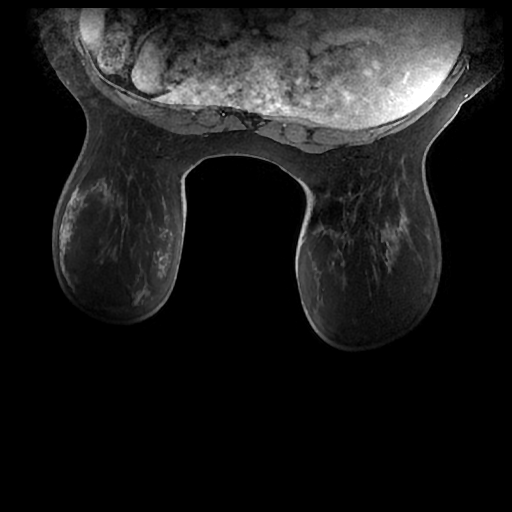
[im 99/248]
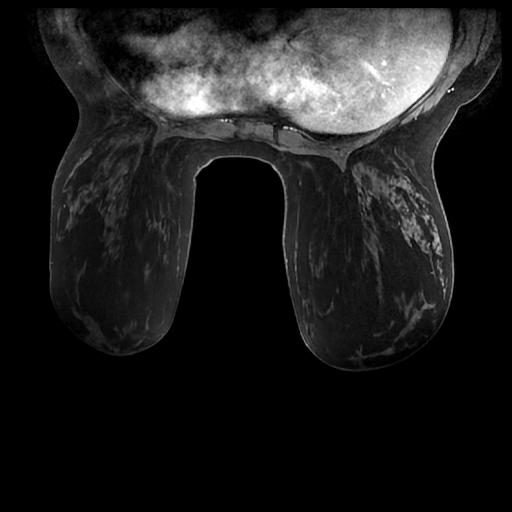
[im 149/248]
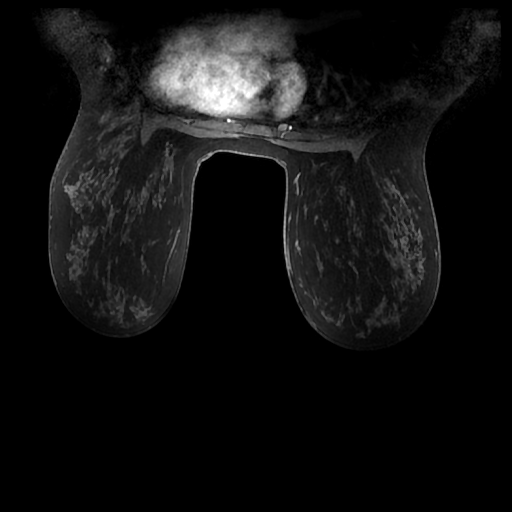
[im 198/248]
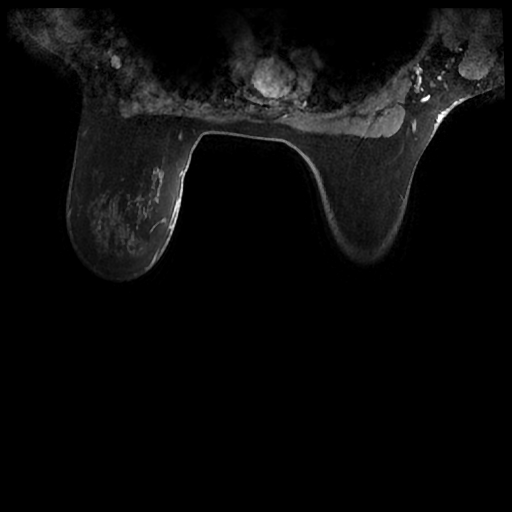
[im 248/248]
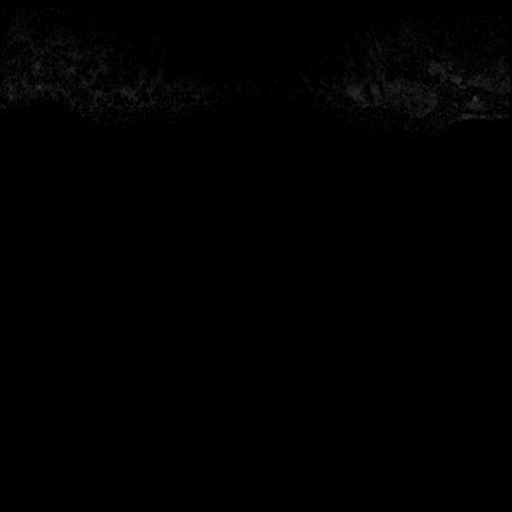

[Series 502: T1 fat-sat · axial · 2.0mm · 0.70mm/px · z∈[-120,-71]mm · 2 of 248 slices shown (3 of 3)]
[im 1/248]
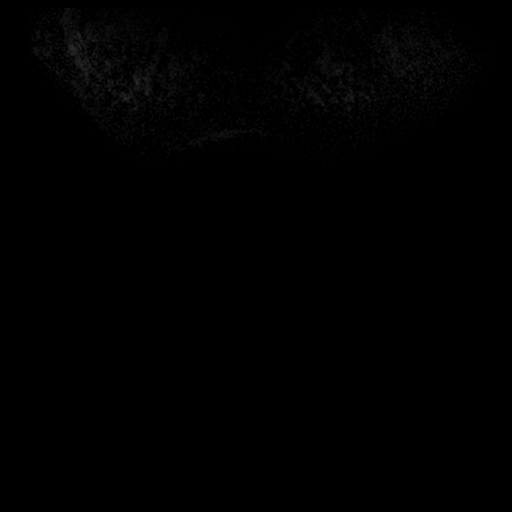
[im 50/248]
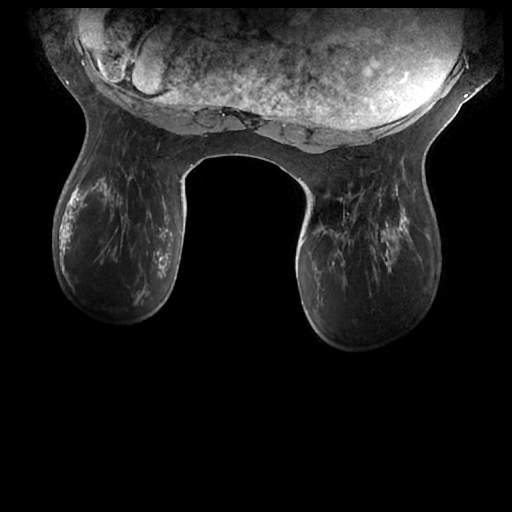

[19 of 48 positions shown; findings below may reference images not displayed]

Three-dimensional MR images were rendered by post-processing of the
original MR data on an independent workstation. The
three-dimensional MR images were interpreted, and findings are
reported in the following complete MRI report for this study. Three
dimensional images were evaluated at the independent [employer]
workstation
FINDINGS: Breast composition: c. Heterogeneous fibroglandular tissue.

Background parenchymal enhancement: Mild

Right breast: No mass or abnormal enhancement.

Left breast: No mass or abnormal enhancement.

Lymph nodes: No abnormal appearing lymph nodes.

Ancillary findings:  None.
IMPRESSION: No MR evidence of breast malignancy.

RECOMMENDATION:
Bilateral screening breast MRI in 1 year.

Annual screening mammogram in [DATE].

BI-RADS CATEGORY  1: Negative.

## 2019-05-24 MED ORDER — GADOBUTROL 1 MMOL/ML IV SOLN
10.0000 mL | Freq: Once | INTRAVENOUS | Status: AC | PRN
  Administered 2019-05-24: 10 mL via INTRAVENOUS

## 2019-05-31 ENCOUNTER — Encounter: Payer: Self-pay | Admitting: Physician Assistant

## 2019-07-02 DIAGNOSIS — H52203 Unspecified astigmatism, bilateral: Secondary | ICD-10-CM | POA: Diagnosis not present

## 2019-07-02 DIAGNOSIS — H5213 Myopia, bilateral: Secondary | ICD-10-CM | POA: Diagnosis not present

## 2019-07-13 MED FILL — TRI FEMYNOR 28 TABLET: 0.18/0.215/ | 84 days supply | Qty: 84 | Fill #1

## 2019-10-11 MED FILL — NORGESTIM-ETH ESTRAD TRIPHA: 0.18/0.215/ | 84 days supply | Qty: 84 | Fill #2

## 2020-01-03 MED FILL — NORGESTIM-ETH ESTRAD TRIPHA: 0.18/0.215/ | 84 days supply | Qty: 84 | Fill #3

## 2020-04-07 DIAGNOSIS — Z01419 Encounter for gynecological examination (general) (routine) without abnormal findings: Secondary | ICD-10-CM | POA: Diagnosis not present

## 2020-04-07 DIAGNOSIS — F419 Anxiety disorder, unspecified: Secondary | ICD-10-CM | POA: Diagnosis not present

## 2020-04-07 DIAGNOSIS — Z9189 Other specified personal risk factors, not elsewhere classified: Secondary | ICD-10-CM | POA: Diagnosis not present

## 2020-04-07 DIAGNOSIS — D259 Leiomyoma of uterus, unspecified: Secondary | ICD-10-CM | POA: Insufficient documentation

## 2020-04-07 DIAGNOSIS — Z1231 Encounter for screening mammogram for malignant neoplasm of breast: Secondary | ICD-10-CM | POA: Diagnosis not present

## 2020-04-07 DIAGNOSIS — Z6828 Body mass index (BMI) 28.0-28.9, adult: Secondary | ICD-10-CM | POA: Diagnosis not present

## 2020-04-07 DIAGNOSIS — Z3040 Encounter for surveillance of contraceptives, unspecified: Secondary | ICD-10-CM | POA: Diagnosis not present

## 2020-04-07 LAB — HM MAMMOGRAPHY

## 2020-04-07 MED FILL — NORGESTIM-ETH ESTRAD TRIPHA: 0.18/0.215/ | 84 days supply | Qty: 84 | Fill #0

## 2020-04-07 MED FILL — SERTRALINE HCL 25 MG TABLET: 25 | 90 days supply | Qty: 90 | Fill #0

## 2020-04-10 ENCOUNTER — Telehealth: Payer: Self-pay

## 2020-04-10 NOTE — Telephone Encounter (Signed)
LAST APPOINTMENT DATE: 04/16/2019  NEXT APPOINTMENT DATE:@5 /21/2021   LAST REFILL: 04/16/2019  QTY: 30 Tablet

## 2020-04-10 NOTE — Telephone Encounter (Signed)
  LAST APPOINTMENT DATE: Visit date not found   NEXT APPOINTMENT DATE:@5 /21/2021  MEDICATION:LORazepam (ATIVAN) 0.5 MG tablet  PH[facility]Y: [facility] Phone:  [phone]  Fax:  [phone]       **Let patient know to contact pharmacy at the end of the day to make sure medication is ready. **  ** Please notify patient to allow 48-72 hours to process**  **Encourage patient to contact the pharmacy for refills or they can request refills through [facility]**  CLINICAL FILLS OUT ALL BELOW:   LAST REFILL:  QTY:  REFILL DATE:    OTHER COMMENTS:    Okay for refill?  Please advise

## 2020-04-11 ENCOUNTER — Other Ambulatory Visit: Payer: Self-pay | Admitting: Physician Assistant

## 2020-04-11 MED ORDER — LORAZEPAM 0.5 MG PO TABS: 0.5000 mg | ORAL_TABLET | Freq: Every day | ORAL | 1 refills | Status: DC | PRN

## 2020-04-11 MED FILL — LORazepam 0.5 MG TABS: 0.5 | 30 days supply | Qty: 30 | Fill #0

## 2020-04-11 NOTE — Telephone Encounter (Signed)
Pt requesting refill for Lorazepam.

## 2020-05-12 ENCOUNTER — Other Ambulatory Visit: Payer: Self-pay

## 2020-05-12 ENCOUNTER — Ambulatory Visit (INDEPENDENT_AMBULATORY_CARE_PROVIDER_SITE_OTHER): Payer: 59 | Admitting: Physician Assistant

## 2020-05-12 ENCOUNTER — Encounter: Payer: Self-pay | Admitting: Physician Assistant

## 2020-05-12 VITALS — BP 112/80 | HR 98 | Temp 98.5°F | Ht 65.0 in | Wt 172.5 lb

## 2020-05-12 DIAGNOSIS — Z13220 Encounter for screening for lipoid disorders: Secondary | ICD-10-CM | POA: Diagnosis not present

## 2020-05-12 DIAGNOSIS — Z0000 Encounter for general adult medical examination without abnormal findings: Secondary | ICD-10-CM | POA: Diagnosis not present

## 2020-05-12 DIAGNOSIS — F419 Anxiety disorder, unspecified: Secondary | ICD-10-CM | POA: Diagnosis not present

## 2020-05-12 DIAGNOSIS — E663 Overweight: Secondary | ICD-10-CM

## 2020-05-12 DIAGNOSIS — Z136 Encounter for screening for cardiovascular disorders: Secondary | ICD-10-CM

## 2020-05-12 LAB — COMPREHENSIVE METABOLIC PANEL
ALT: 11 U/L (ref 0–35)
AST: 13 U/L (ref 0–37)
Albumin: 4.5 g/dL (ref 3.5–5.2)
Alkaline Phosphatase: 40 U/L (ref 39–117)
BUN: 13 mg/dL (ref 6–23)
CO2: 28 mEq/L (ref 19–32)
Calcium: 10 mg/dL (ref 8.4–10.5)
Chloride: 101 mEq/L (ref 96–112)
Creatinine, Ser: 0.72 mg/dL (ref 0.40–1.20)
GFR: 88.75 mL/min (ref >60.00)
Glucose, Bld: 93 mg/dL (ref 70–99)
Potassium: 4.8 mEq/L (ref 3.5–5.1)
Sodium: 137 mEq/L (ref 135–145)
Total Bilirubin: 0.3 mg/dL (ref 0.2–1.2)
Total Protein: 7.3 g/dL (ref 6.0–8.3)

## 2020-05-12 LAB — LIPID PANEL
Cholesterol: 245 mg/dL — ABNORMAL HIGH (ref 0–200)
HDL: 96.1 mg/dL (ref >39.00)
LDL Cholesterol: 128 mg/dL — ABNORMAL HIGH (ref 0–99)
NonHDL: 148.42
Total CHOL/HDL Ratio: 3
Triglycerides: 100 mg/dL (ref 0.0–149.0)
VLDL: 20 mg/dL (ref 0.0–40.0)

## 2020-05-12 LAB — CBC WITH DIFFERENTIAL/PLATELET
Basophils Absolute: 0 10*3/uL (ref 0.0–0.1)
Basophils Relative: 0.3 % (ref 0.0–3.0)
Eosinophils Absolute: 0 10*3/uL (ref 0.0–0.7)
Eosinophils Relative: 0.2 % (ref 0.0–5.0)
HCT: 42 % (ref 36.0–46.0)
Hemoglobin: 13.8 g/dL (ref 12.0–15.0)
Lymphocytes Relative: 26.6 % (ref 12.0–46.0)
Lymphs Abs: 2.2 10*3/uL (ref 0.7–4.0)
MCHC: 32.8 g/dL (ref 30.0–36.0)
MCV: 82.7 fl (ref 78.0–100.0)
Monocytes Absolute: 0.5 10*3/uL (ref 0.1–1.0)
Monocytes Relative: 5.9 % (ref 3.0–12.0)
Neutro Abs: 5.7 10*3/uL (ref 1.4–7.7)
Neutrophils Relative %: 67 % (ref 43.0–77.0)
Platelets: 305 10*3/uL (ref 150.0–400.0)
RBC: 5.08 Mil/uL (ref 3.87–5.11)
RDW: 13.8 % (ref 11.5–15.5)
WBC: 8.4 10*3/uL (ref 4.0–10.5)

## 2020-05-12 MED ORDER — LORAZEPAM 0.5 MG PO TABS: 0.5000 mg | ORAL_TABLET | Freq: Every day | ORAL | 1 refills | Status: DC | PRN

## 2020-05-12 MED ORDER — ESCITALOPRAM OXALATE 5 MG PO TABS
5.0000 mg | ORAL_TABLET | Freq: Every day | ORAL | 1 refills | Status: DC
Start: 2020-05-12 — End: 2020-06-05

## 2020-05-12 MED FILL — LORazepam 0.5 MG TABS: 0.5 | 30 days supply | Qty: 30 | Fill #0

## 2020-05-12 MED FILL — ESCITALOPRAM 5 MG TABLET: 5 | 30 days supply | Qty: 30 | Fill #0

## 2020-05-12 NOTE — Patient Instructions (Signed)
It was great to see you!  Please go to the lab for blood work.   Start Lexapro 5 mg daily and let me know how you are doing with this.  Our office will call you with your results unless you have chosen to receive results via MyChart.  If your blood work is normal we will follow-up each year for physicals and as scheduled for chronic medical problems.  If anything is abnormal we will treat accordingly and get you in for a follow-up.  Take care,  [facility] Maintenance, Female Adopting a healthy lifestyle and getting preventive care are important in promoting health and wellness. Ask your health care provider about:  The right schedule for you to have regular tests and exams.  Things you can do on your own to prevent diseases and keep yourself healthy. What should I know about diet, weight, and exercise? Eat a healthy diet   Eat a diet that includes plenty of vegetables, fruits, low-fat dairy products, and lean protein.  Do not eat a lot of foods that are high in solid fats, added sugars, or sodium. Maintain a healthy weight Body mass index (BMI) is used to identify weight problems. It estimates body fat based on height and weight. Your health care provider can help determine your BMI and help you achieve or maintain a healthy weight. Get regular exercise Get regular exercise. This is one of the most important things you can do for your health. Most adults should:  Exercise for at least 150 minutes each week. The exercise should increase your heart rate and make you sweat (moderate-intensity exercise).  Do strengthening exercises at least twice a week. This is in addition to the moderate-intensity exercise.  Spend less time sitting. Even light physical activity can be beneficial. Watch cholesterol and blood lipids Have your blood tested for lipids and cholesterol at 42 years of age, then have this test every 5 years. Have your cholesterol levels checked more often  if:  Your lipid or cholesterol levels are high.  You are older than 42 years of age.  You are at high risk for heart disease. What should I know about cancer screening? Depending on your health history and family history, you may need to have cancer screening at various ages. This may include screening for:  Breast cancer.  Cervical cancer.  Colorectal cancer.  Skin cancer.  Lung cancer. What should I know about heart disease, diabetes, and high blood pressure? Blood pressure and heart disease  High blood pressure causes heart disease and increases the risk of stroke. This is more likely to develop in people who have high blood pressure readings, are of African descent, or are overweight.  Have your blood pressure checked: ? Every 3-5 years if you are 68-58 years of age. ? Every year if you are 3 years old or older. Diabetes Have regular diabetes screenings. This checks your fasting blood sugar level. Have the screening done:  Once every three years after age 78 if you are at a normal weight and have a low risk for diabetes.  More often and at a younger age if you are overweight or have a high risk for diabetes. What should I know about preventing infection? Hepatitis B If you have a higher risk for hepatitis B, you should be screened for this virus. Talk with your health care provider to find out if you are at risk for hepatitis B infection. Hepatitis C Testing is recommended for:  Everyone born from [birth year] through 1963.  Anyone with known risk factors for hepatitis C. Sexually transmitted infections (STIs)  Get screened for STIs, including gonorrhea and chlamydia, if: ? You are sexually active and are younger than 42 years of age. ? You are older than 42 years of age and your health care provider tells you that you are at risk for this type of infection. ? Your sexual activity has changed since you were last screened, and you are at increased risk for chlamydia or  gonorrhea. Ask your health care provider if you are at risk.  Ask your health care provider about whether you are at high risk for HIV. Your health care provider may recommend a prescription medicine to help prevent HIV infection. If you choose to take medicine to prevent HIV, you should first get tested for HIV. You should then be tested every 3 months for as long as you are taking the medicine. Pregnancy  If you are about to stop having your period (premenopausal) and you may become pregnant, seek counseling before you get pregnant.  Take 400 to 800 micrograms (mcg) of folic acid every day if you become pregnant.  Ask for birth control (contraception) if you want to prevent pregnancy. Osteoporosis and menopause Osteoporosis is a disease in which the bones lose minerals and strength with aging. This can result in bone fractures. If you are 41 years old or older, or if you are at risk for osteoporosis and fractures, ask your health care provider if you should:  Be screened for bone loss.  Take a calcium or vitamin D supplement to lower your risk of fractures.  Be given hormone replacement therapy (HRT) to treat symptoms of menopause. Follow these instructions at home: Lifestyle  Do not use any products that contain nicotine or tobacco, such as cigarettes, e-cigarettes, and chewing tobacco. If you need help quitting, ask your health care provider.  Do not use street drugs.  Do not share needles.  Ask your health care provider for help if you need support or information about quitting drugs. Alcohol use  Do not drink alcohol if: ? Your health care provider tells you not to drink. ? You are pregnant, may be pregnant, or are planning to become pregnant.  If you drink alcohol: ? Limit how much you use to 0-1 drink a day. ? Limit intake if you are breastfeeding.  Be aware of how much alcohol is in your drink. In the [place] one drink equals one 12 oz bottle of beer (355 mL), one 5 oz  glass of wine (148 mL), or one 1 oz glass of hard liquor (44 mL). General instructions  Schedule regular health, dental, and eye exams.  Stay current with your vaccines.  Tell your health care provider if: ? You often feel depressed. ? You have ever been abused or do not feel safe at home. Summary  Adopting a healthy lifestyle and getting preventive care are important in promoting health and wellness.  Follow your health care provider's instructions about healthy diet, exercising, and getting tested or screened for diseases.  Follow your health care provider's instructions on monitoring your cholesterol and blood pressure. This information is not intended to replace advice given to you by your health care provider. Make sure you discuss any questions you have with your health care provider. Document Revised: 12/02/2018 Document Reviewed: 12/02/2018 Elsevier Patient Education  2020 [employer].

## 2020-05-12 NOTE — Progress Notes (Signed)
I acted as a Education administrator for [employer], PA-C Anselmo Pickler, LPN    Subjective:    Marissa Gonzalez is a 44 y.o. female and is here for a comprehensive physical exam.   HPI  Health Maintenance Due  Topic Date Due  . MAMMOGRAM  Never done   Acute Concerns: None  Chronic Issues: Anxiety -- currently uncontrolled. She states that zoloft caused headaches, nausea; prozac maybe wasn't effective (used when she was much younger); she is currently wanting to trial lexapro.  Health Maintenance: Immunizations -- UTD Colonoscopy -- N/A Mammogram -- UTD, Done will get report PAP -- UTD, see [facility] Bone Density -- N/A Diet -- cleaned up diet -- protein shake in the morning, limited CHO Sleep habits -- overall okay, sometimes has situational insomnia Exercise -- exercising regularly -- walking 1 mile most days Weight -- Weight: 172 lb 8 oz (78.2 kg)  Weight history: Wt Readings from Last 10 Encounters:  05/12/20 172 lb 8 oz (78.2 kg)  02/18/19 190 lb (86.2 kg)  11/17/18 187 lb 3.2 oz (84.9 kg)  04/24/18 186 lb (84.4 kg)  04/17/18 187 lb 6.1 oz (85 kg)  01/27/18 184 lb 6.4 oz (83.6 kg)  08/08/15 213 lb (96.6 kg)  08/07/15 213 lb (96.6 kg)  06/03/14 170 lb (77.1 kg)  03/10/12 196 lb (88.9 kg)   Body mass index is 28.71 kg/m. Patient's last menstrual period was 04/18/2020. Alcohol use: occasionally on the weekend; not increased w/ covid Tobacco use: former  Depression screen PHQ 2/9 05/12/2020  Decreased Interest 0  Down, Depressed, Hopeless 1  PHQ - 2 Score 1     Other providers/specialists: Patient Care Team: Inda Coke, [place] as PCP - General (Physician Assistant)    PMHx, SurgHx, SocialHx, Medications, and Allergies were reviewed in the Visit Navigator and updated as appropriate.   Past Medical History:  Diagnosis Date  . Abnormal Pap smear   . Anxiety   . Fibroid 2012   this pregnancy  . GERD (gastroesophageal reflux disease)     pregnancy related  . Headache(784.0)   . Heart murmur    no problems  . Hx of migraines    as teenager  . Lactose intolerance   . Sciatic nerve pain 2012   this pregnancy     Past Surgical History:  Procedure Laterality Date  . CESAREAN SECTION  03/10/2012   Procedure: CESAREAN SECTION;  Surgeon: Cyril Mourning, MD;x 1  Location: [facility];  Service: Gynecology;  Laterality: N/A;  . CESAREAN SECTION  08/08/2015  . CESAREAN SECTION WITH BILATERAL TUBAL LIGATION Bilateral 08/08/2015   Procedure: CESAREAN SECTION WITH Left Partial Salpingectomy, and Right Ligation of Fallopian tube with Filshie Clip.;  Surgeon: Marylynn Pearson, MD;  Location: [facility];  Service: Obstetrics;  Laterality: Bilateral;  edc 08/12/15 [name] RNFA confirmed 7/29...tms   . dental implant  1995  . NO PAST SURGERIES    . TUBAL LIGATION  08/08/2015  . WISDOM TOOTH EXTRACTION  1985     Family History  Problem Relation Age of Onset  . Cancer Maternal Grandmother   . Arthritis Maternal Grandmother   . Miscarriages / Stillbirths Maternal Grandmother   . Stroke Maternal Grandmother   . Depression Mother        per prenatal  . Hypertension Mother   . Depression Father        per prenatal  . Hearing loss Father   . Hypertension Father   .  Hyperlipidemia Father   . Depression Brother   . Heart attack Maternal Grandfather   . Heart disease Maternal Grandfather   . Hypertension Maternal Grandfather   . Heart attack Paternal Grandfather   . Heart disease Paternal Grandfather     Social History   Tobacco Use  . Smoking status: Former Smoker    Quit date: 11/13/1999    Years since quitting: 20.5  . Smokeless tobacco: Never Used  Substance Use Topics  . Alcohol use: Yes    Comment: 2 drinks per weekend beer and liquor  . Drug use: Yes    Types: Marijuana    Comment: couple times a month    Review of Systems:   Review of Systems  Constitutional: Negative for chills, fever, malaise/fatigue and  weight loss.  HENT: Negative for hearing loss, sinus pain and sore throat.   Eyes: Negative for blurred vision.  Respiratory: Negative for cough and shortness of breath.   Cardiovascular: Negative for chest pain, palpitations and leg swelling.  Gastrointestinal: Negative for abdominal pain, constipation, diarrhea, heartburn, nausea and vomiting.  Genitourinary: Negative for dysuria, frequency and urgency.  Musculoskeletal: Negative for back pain, myalgias and neck pain.  Skin: Negative for itching and rash.  Neurological: Negative for dizziness, tingling, seizures and loss of consciousness.  Endo/Heme/Allergies: Negative for polydipsia.  Psychiatric/Behavioral: Negative for depression. The patient is not nervous/anxious.   All other systems reviewed and are negative.   Objective:   BP 112/80 (BP Location: Left Arm, Patient Position: Sitting, Cuff Size: Normal)   Pulse 98   Temp 98.5 F (36.9 C) (Temporal)   Ht 5\' 5"  (1.651 m)   Wt 172 lb 8 oz (78.2 kg)   LMP 04/18/2020   SpO2 99%   BMI 28.71 kg/m  Body mass index is 28.71 kg/m.   General Appearance:    Alert, cooperative, no distress, appears stated age  Head:    Normocephalic, without obvious abnormality, atraumatic  Eyes:    PERRL, conjunctiva/corneas clear, EOM's intact, fundi    benign, both eyes  Ears:    Normal TM's and external ear canals, both ears  Nose:   Nares normal, septum midline, mucosa normal, no drainage    or sinus tenderness  Throat:   Lips, mucosa, and tongue normal; teeth and gums normal  Neck:   Supple, symmetrical, trachea midline, no adenopathy;    thyroid:  no enlargement/tenderness/nodules; no carotid   bruit or JVD  Back:     Symmetric, no curvature, ROM normal, no CVA tenderness  Lungs:     Clear to auscultation bilaterally, respirations unlabored  Chest Wall:    No tenderness or deformity   Heart:    Regular rate and rhythm, S1 and S2 normal, no murmur, rub or gallop  Breast Exam:    Deferred    Abdomen:     Soft, non-tender, bowel sounds active all four quadrants,    no masses, no organomegaly  Genitalia:    Deferred  Extremities:   Extremities normal, atraumatic, no cyanosis or edema  Pulses:   2+ and symmetric all extremities  Skin:   Skin color, texture, turgor normal, no rashes or lesions  Lymph nodes:   Cervical, supraclavicular, and axillary nodes normal  Neurologic:   CNII-XII intact, normal strength, sensation and reflexes    throughout     Assessment/Plan:   Breezy was seen today for annual exam.  Diagnoses and all orders for this visit:  Routine physical examination Today patient counseled on  age appropriate routine health concerns for screening and prevention, each reviewed and up to date or declined. Immunizations reviewed and up to date or declined. Labs ordered and reviewed. Risk factors for depression reviewed and negative. Hearing function and visual acuity are intact. ADLs screened and addressed as needed. Functional ability and level of safety reviewed and appropriate. Education, counseling and referrals performed based on assessed risks today. Patient provided with a copy of personalized plan for preventive services.  Anxiety Uncontrolled. Start Lexapro 5 mg daily. Follow-up via MyChart to let us know how she is doing with this, sooner if concerns. -     CBC with Differential/Platelet -     Comprehensive metabolic panel  Encounter for lipid screening for cardiovascular disease -     Lipid panel  Overweight Encouraged increase in activity, continue healthy eating.  Other orders -     LORazepam (ATIVAN) 0.5 MG tablet; Take 1 tablet (0.5 mg total) by mouth daily as needed for anxiety. -     escitalopram (LEXAPRO) 5 MG tablet; Take 1 tablet (5 mg total) by mouth daily.     Well Adult Exam: Labs ordered: Yes. Patient counseling was done. See below for items discussed. Discussed the patient's BMI.  The BMI is not in the acceptable range; BMI management  plan is completed Follow up via Mychart for anxiety. Breast cancer screening: UTD. Cervical cancer screening: UTD   Patient Counseling: [x]    Nutrition: Stressed importance of moderation in sodium/caffeine intake, saturated fat and cholesterol, caloric balance, sufficient intake of fresh fruits, vegetables, fiber, calcium, iron, and 1 mg of folate supplement per day (for females capable of pregnancy).  [x]    Stressed the importance of regular exercise.   [x]    Substance Abuse: Discussed cessation/primary prevention of tobacco, alcohol, or other drug use; driving or other dangerous activities under the influence; availability of treatment for abuse.   [x]    Injury prevention: Discussed safety belts, safety helmets, smoke detector, smoking near bedding or upholstery.   [x]    Sexuality: Discussed sexually transmitted diseases, partner selection, use of condoms, avoidance of unintended pregnancy  and contraceptive alternatives.  [x]    Dental health: Discussed importance of regular tooth brushing, flossing, and dental visits.  [x]    Health maintenance and immunizations reviewed. Please refer to Health maintenance section.   CMA or LPN served as scribe during this visit. History, Physical, and Plan performed by medical provider. The above documentation has been reviewed and is accurate and complete.   Inda Coke, PA-C [place]

## 2020-06-05 ENCOUNTER — Other Ambulatory Visit: Payer: Self-pay | Admitting: Physician Assistant

## 2020-06-05 ENCOUNTER — Encounter: Payer: Self-pay | Admitting: Physician Assistant

## 2020-06-05 MED ORDER — ESCITALOPRAM OXALATE 10 MG PO TABS: 10.0000 mg | ORAL_TABLET | Freq: Every day | ORAL | 1 refills | Status: DC

## 2020-06-05 MED FILL — ESCITALOPRAM 10 MG TABLET: 10 | 90 days supply | Qty: 90 | Fill #0

## 2020-06-06 ENCOUNTER — Encounter: Payer: Self-pay | Admitting: Physician Assistant

## 2020-06-30 ENCOUNTER — Other Ambulatory Visit: Payer: Self-pay | Admitting: Physician Assistant

## 2020-06-30 MED ORDER — CITALOPRAM HYDROBROMIDE 10 MG PO TABS: 10.0000 mg | ORAL_TABLET | Freq: Every day | ORAL | 1 refills | Status: DC

## 2020-06-30 MED FILL — CITALOPRAM HBR 10 MG TABLET: 10 | 30 days supply | Qty: 30 | Fill #0

## 2020-07-07 DIAGNOSIS — H52203 Unspecified astigmatism, bilateral: Secondary | ICD-10-CM | POA: Diagnosis not present

## 2020-07-07 DIAGNOSIS — H5213 Myopia, bilateral: Secondary | ICD-10-CM | POA: Diagnosis not present

## 2020-07-27 MED FILL — CITALOPRAM HBR 10 MG TABLET: 10 | 30 days supply | Qty: 30 | Fill #1

## 2020-08-24 ENCOUNTER — Other Ambulatory Visit: Payer: Self-pay | Admitting: Physician Assistant

## 2020-08-24 MED FILL — CITALOPRAM HBR 10 MG TABLET: 10 | 30 days supply | Qty: 30 | Fill #0

## 2020-09-26 MED FILL — CITALOPRAM HBR 10 MG TABLET: 10 | 30 days supply | Qty: 30 | Fill #1

## 2020-10-28 ENCOUNTER — Other Ambulatory Visit: Payer: Self-pay | Admitting: Physician Assistant

## 2020-10-30 MED FILL — CITALOPRAM HBR 10 MG TABLET: 10 | 30 days supply | Qty: 30 | Fill #0

## 2020-11-29 MED FILL — CITALOPRAM HBR 10 MG TABLET: 10 | 30 days supply | Qty: 30 | Fill #1

## 2020-12-04 ENCOUNTER — Encounter: Payer: Self-pay | Admitting: Physician Assistant

## 2020-12-05 ENCOUNTER — Other Ambulatory Visit: Payer: Self-pay | Admitting: Physician Assistant

## 2020-12-05 MED ORDER — CITALOPRAM HYDROBROMIDE 20 MG PO TABS
20.0000 mg | ORAL_TABLET | Freq: Every day | ORAL | 1 refills | Status: DC
Start: 2020-12-05 — End: 2020-12-05

## 2020-12-05 MED ORDER — LORAZEPAM 0.5 MG PO TABS: 0.5000 mg | ORAL_TABLET | Freq: Every day | ORAL | 1 refills | Status: DC | PRN

## 2020-12-05 MED FILL — CITALOPRAM HBR 20 MG TABLET: 20 | 90 days supply | Qty: 90 | Fill #0

## 2020-12-05 MED FILL — LORazepam 0.5 MG TABS: 0.5 | 30 days supply | Qty: 30 | Fill #0

## 2021-03-15 ENCOUNTER — Other Ambulatory Visit (HOSPITAL_BASED_OUTPATIENT_CLINIC_OR_DEPARTMENT_OTHER): Payer: Self-pay

## 2021-05-31 ENCOUNTER — Ambulatory Visit (INDEPENDENT_AMBULATORY_CARE_PROVIDER_SITE_OTHER): Payer: 59 | Admitting: Family

## 2021-05-31 ENCOUNTER — Other Ambulatory Visit: Payer: Self-pay

## 2021-05-31 ENCOUNTER — Encounter: Payer: Self-pay | Admitting: Family

## 2021-05-31 ENCOUNTER — Other Ambulatory Visit (HOSPITAL_COMMUNITY): Payer: Self-pay

## 2021-05-31 VITALS — BP 124/80 | HR 86 | Temp 98.3°F | Ht 65.0 in | Wt 182.2 lb

## 2021-05-31 DIAGNOSIS — F419 Anxiety disorder, unspecified: Secondary | ICD-10-CM

## 2021-05-31 DIAGNOSIS — Z0000 Encounter for general adult medical examination without abnormal findings: Secondary | ICD-10-CM | POA: Diagnosis not present

## 2021-05-31 DIAGNOSIS — R5383 Other fatigue: Secondary | ICD-10-CM | POA: Diagnosis not present

## 2021-05-31 MED ORDER — CITALOPRAM HYDROBROMIDE 20 MG PO TABS
ORAL_TABLET | Freq: Every day | ORAL | 1 refills | Status: DC
  Filled 2021-05-31: qty 90, 90d supply, fill #0
  Filled 2021-09-10: qty 90, 90d supply, fill #1

## 2021-05-31 MED ORDER — LORAZEPAM 0.5 MG PO TABS
ORAL_TABLET | Freq: Every day | ORAL | 1 refills | Status: DC | PRN
  Filled 2021-05-31: qty 30, 30d supply, fill #0

## 2021-05-31 NOTE — Patient Instructions (Signed)
Health Maintenance, Female Adopting a healthy lifestyle and getting preventive care are important in promoting health and wellness. Ask your health care provider about: The right schedule for you to have regular tests and exams. Things you can do on your own to prevent diseases and keep yourself healthy. What should I know about diet, weight, and exercise? Eat a healthy diet Eat a diet that includes plenty of vegetables, fruits, low-fat dairy products, and lean protein. Do not eat a lot of foods that are high in solid fats, added sugars, or sodium.   Maintain a healthy weight Body mass index (BMI) is used to identify weight problems. It estimates body fat based on height and weight. Your health care provider can help determine your BMI and help you achieve or maintain a healthy weight. Get regular exercise Get regular exercise. This is one of the most important things you can do for your health. Most adults should: Exercise for at least 150 minutes each week. The exercise should increase your heart rate and make you sweat (moderate-intensity exercise). Do strengthening exercises at least twice a week. This is in addition to the moderate-intensity exercise. Spend less time sitting. Even light physical activity can be beneficial. Watch cholesterol and blood lipids Have your blood tested for lipids and cholesterol at 43 years of age, then have this test every 5 years. Have your cholesterol levels checked more often if: Your lipid or cholesterol levels are high. You are older than 43 years of age. You are at high risk for heart disease. What should I know about cancer screening? Depending on your health history and family history, you may need to have cancer screening at various ages. This may include screening for: Breast cancer. Cervical cancer. Colorectal cancer. Skin cancer. Lung cancer. What should I know about heart disease, diabetes, and high blood pressure? Blood pressure and heart  disease High blood pressure causes heart disease and increases the risk of stroke. This is more likely to develop in people who have high blood pressure readings, are of African descent, or are overweight. Have your blood pressure checked: Every 3-5 years if you are 63-90 years of age. Every year if you are 24 years old or older. Diabetes Have regular diabetes screenings. This checks your fasting blood sugar level. Have the screening done: Once every three years after age 80 if you are at a normal weight and have a low risk for diabetes. More often and at a younger age if you are overweight or have a high risk for diabetes. What should I know about preventing infection? Hepatitis B If you have a higher risk for hepatitis B, you should be screened for this virus. Talk with your health care provider to find out if you are at risk for hepatitis B infection. Hepatitis C Testing is recommended for: Everyone born from [birth year] through 1963. Anyone with known risk factors for hepatitis C. Sexually transmitted infections (STIs) Get screened for STIs, including gonorrhea and chlamydia, if: You are sexually active and are younger than 43 years of age. You are older than 43 years of age and your health care provider tells you that you are at risk for this type of infection. Your sexual activity has changed since you were last screened, and you are at increased risk for chlamydia or gonorrhea. Ask your health care provider if you are at risk. Ask your health care provider about whether you are at high risk for HIV. Your health care provider may recommend a prescription  medicine to help prevent HIV infection. If you choose to take medicine to prevent HIV, you should first get tested for HIV. You should then be tested every 3 months for as long as you are taking the medicine. Pregnancy If you are about to stop having your period (premenopausal) and you may become pregnant, seek counseling before you get  pregnant. Take 400 to 800 micrograms (mcg) of folic acid every day if you become pregnant. Ask for birth control (contraception) if you want to prevent pregnancy. Osteoporosis and menopause Osteoporosis is a disease in which the bones lose minerals and strength with aging. This can result in bone fractures. If you are 80 years old or older, or if you are at risk for osteoporosis and fractures, ask your health care provider if you should: Be screened for bone loss. Take a calcium or vitamin D supplement to lower your risk of fractures. Be given hormone replacement therapy (HRT) to treat symptoms of menopause. Follow these instructions at home: Lifestyle Do not use any products that contain nicotine or tobacco, such as cigarettes, e-cigarettes, and chewing tobacco. If you need help quitting, ask your health care provider. Do not use street drugs. Do not share needles. Ask your health care provider for help if you need support or information about quitting drugs. Alcohol use Do not drink alcohol if: Your health care provider tells you not to drink. You are pregnant, may be pregnant, or are planning to become pregnant. If you drink alcohol: Limit how much you use to 0-1 drink a day. Limit intake if you are breastfeeding. Be aware of how much alcohol is in your drink. In the [place] one drink equals one 12 oz bottle of beer (355 mL), one 5 oz glass of wine (148 mL), or one 1 oz glass of hard liquor (44 mL). General instructions Schedule regular health, dental, and eye exams. Stay current with your vaccines. Tell your health care provider if: You often feel depressed. You have ever been abused or do not feel safe at home. Summary Adopting a healthy lifestyle and getting preventive care are important in promoting health and wellness. Follow your health care provider's instructions about healthy diet, exercising, and getting tested or screened for diseases. Follow your health care provider's  instructions on monitoring your cholesterol and blood pressure. This information is not intended to replace advice given to you by your health care provider. Make sure you discuss any questions you have with your health care provider. Document Revised: 12/02/2018 Document Reviewed: 12/02/2018 Elsevier Patient Education  2021 [place] and Cholesterol Restricted Eating Plan Eating a diet that limits fat and cholesterol may help lower your risk for heart disease and other conditions. Your body needs fat and cholesterol for basic functions, but eating too much of these things can be harmful to your health. Your health care provider may order lab tests to check your blood fat (lipid) and cholesterol levels. This helps your health care provider understand your risk for certain conditions and whether you need to make diet changes. Work with your health care provider or dietitian to make an eating plan that is right for you. Your plan includes: Limit your fat intake to ______% or less of your total calories a day. Limit your saturated fat intake to ______% or less of your total calories a day. Limit the amount of cholesterol in your diet to less than _________mg a day. Eat ___________ g of fiber a day. What are tips for following  this plan? General guidelines If you are overweight, work with your health care provider to lose weight safely. Losing just 5-10% of your body weight can improve your overall health and help prevent diseases such as diabetes and heart disease. Avoid: Foods with added sugar. Fried foods. Foods that contain partially hydrogenated oils, including stick margarine, some tub margarines, cookies, crackers, and other baked goods. Limit alcohol intake to no more than 1 drink a day for nonpregnant women and 2 drinks a day for men. One drink equals 12 oz of beer, 5 oz of wine, or 1 oz of hard liquor.   Reading food labels Check food labels for: Trans fats, partially  hydrogenated oils, or high amounts of saturated fat. Avoid foods that contain saturated fat and trans fat. The amount of cholesterol in each serving. Try to eat no more than 200 mg of cholesterol each day. The amount of fiber in each serving. Try to eat at least 20-30 g of fiber each day. Choose foods with healthy fats, such as: Monounsaturated and polyunsaturated fats. These include olive and canola oil, flaxseeds, walnuts, almonds, and seeds. Omega-3 fats. These are found in foods such as salmon, mackerel, sardines, tuna, flaxseed oil, and ground flaxseeds. Choose grain products that have whole grains. Look for the word "whole" as the first word in the ingredient list. Cooking Cook foods using methods other than frying. Baking, boiling, grilling, and broiling are some healthy options. Eat more home-cooked food and less restaurant, buffet, and fast food. Avoid cooking using saturated fats. Animal sources of saturated fats include meats, butter, and cream. Plant sources of saturated fats include palm oil, palm kernel oil, and coconut oil. Meal planning At meals, imagine dividing your plate into fourths: Fill one-half of your plate with vegetables and green salads. Fill one-fourth of your plate with whole grains. Fill one-fourth of your plate with lean protein foods. Eat fish that is high in omega-3 fats at least two times a week. Eat more foods that contain fiber, such as whole grains, beans, apples, broccoli, carrots, peas, and barley. These foods help promote healthy cholesterol levels in the blood.   Recommended foods Grains Whole grains, such as whole wheat or whole grain breads, crackers, cereals, and pasta. Unsweetened oatmeal, bulgur, barley, quinoa, or brown rice. Corn or whole wheat flour tortillas. Vegetables Fresh or frozen vegetables (raw, steamed, roasted, or grilled). Green salads. Fruits All fresh, canned (in natural juice), or frozen fruits. Meats and other protein  foods Ground beef (85% or leaner), grass-fed beef, or beef trimmed of fat. Skinless chicken or [place]. Ground chicken or [place]. Pork trimmed of fat. All fish and seafood. Egg whites. Dried beans, peas, or lentils. Unsalted nuts or seeds. Unsalted canned beans. Natural nut butters without added sugar and oil. Dairy Low-fat or nonfat dairy products, such as skim or 1% milk, 2% or reduced-fat cheeses, low-fat and fat-free ricotta or cottage cheese, or plain low-fat and nonfat yogurt. Fats and oils Tub margarine without trans fats. Light or reduced-fat mayonnaise and salad dressings. Avocado. Olive, canola, sesame, or safflower oils. The items listed above may not be a complete list of foods and beverages you can eat. Contact a dietitian for more information. Foods to avoid Grains White bread. White pasta. White rice. Cornbread. Bagels, pastries, and croissants. Crackers and snack foods that contain trans fat and hydrogenated oils. Vegetables Vegetables cooked in cheese, cream, or butter sauce. Fried vegetables. Fruits Canned fruit in heavy syrup. Fruit in cream or butter sauce. [place] Pro  fruit. Meats and other protein foods Fatty cuts of meat. Ribs, chicken wings, bacon, sausage, bologna, salami, chitterlings, fatback, hot dogs, bratwurst, and packaged lunch meats. Liver and organ meats. Whole eggs and egg yolks. Chicken and [place] with skin. Fried meat. Dairy Whole or 2% milk, cream, half-and-half, and cream cheese. Whole milk cheeses. Whole-fat or sweetened yogurt. Full-fat cheeses. Nondairy creamers and whipped toppings. Processed cheese, cheese spreads, and cheese curds. Beverages Alcohol. Sugar-sweetened drinks such as sodas, lemonade, and fruit drinks. Fats and oils Butter, stick margarine, lard, shortening, ghee, or bacon fat. Coconut, palm kernel, and palm oils. Sweets and desserts Corn syrup, sugars, honey, and molasses. Candy. Jam and jelly. Syrup. Sweetened cereals. Cookies, pies, cakes,  donuts, muffins, and ice cream. The items listed above may not be a complete list of foods and beverages you should avoid. Contact a dietitian for more information. Summary Your body needs fat and cholesterol for basic functions. However, eating too much of these things can be harmful to your health. Work with your health care provider and dietitian to follow a diet low in fat and cholesterol. Doing this may help lower your risk for heart disease and other conditions. Choose healthy fats, such as monounsaturated and polyunsaturated fats, and foods high in omega-3 fatty acids. Eat fiber-rich foods, such as whole grains, beans, peas, fruits, and vegetables. Limit or avoid alcohol, fried foods, and foods high in saturated fats, partially hydrogenated oils, and sugar. This information is not intended to replace advice given to you by your health care provider. Make sure you discuss any questions you have with your health care provider. Document Revised: 08/09/2020 Document Reviewed: 04/12/2020 Elsevier Patient Education  2021 [employer].

## 2021-05-31 NOTE — Progress Notes (Signed)
Established Patient Office Visit  Subjective:  Patient ID: Marissa Gonzalez, female    DOB: May 16, 1976  Age: 45 y.o. MRN: [id number]  CC:  Chief Complaint  Patient presents with  . Annual Exam  . Anxiety  . Fatigue    Worsened in the last couple of months. She says that she is napping more through the day.    HPI [name] presents for a CPX. She has concerns that she is more fatigued that usual. She reports taking more naps during the day that usual. She is a therapist. She admits to mild anxiety and taking medication that works well. She has an appt for mammogram and pap smear coming up. Does not routinely exercise. Admits to around 6 hours of sleep nightly.   Past Medical History:  Diagnosis Date  . Abnormal Pap smear   . Anxiety   . Fibroid 2012   this pregnancy  . GERD (gastroesophageal reflux disease)    pregnancy related  . Headache(784.0)   . Heart murmur    no problems  . Hx of migraines    as teenager  . Lactose intolerance   . Sciatic nerve pain 2012   this pregnancy    Past Surgical History:  Procedure Laterality Date  . CESAREAN SECTION  03/10/2012   Procedure: CESAREAN SECTION;  Surgeon: Cyril Mourning, MD;x 1  Location: [facility];  Service: Gynecology;  Laterality: N/A;  . CESAREAN SECTION  08/08/2015  . CESAREAN SECTION WITH BILATERAL TUBAL LIGATION Bilateral 08/08/2015   Procedure: CESAREAN SECTION WITH Left Partial Salpingectomy, and Right Ligation of Fallopian tube with Filshie Clip.;  Surgeon: Marylynn Pearson, MD;  Location: [facility];  Service: Obstetrics;  Laterality: Bilateral;  edc 08/12/15 [name] RNFA confirmed 7/29...tms   . dental implant  1995  . NO PAST SURGERIES    . TUBAL LIGATION  08/08/2015  . WISDOM TOOTH EXTRACTION  1985    Family History  Problem Relation Age of Onset  . Cancer Maternal Grandmother   . Arthritis Maternal Grandmother   . Miscarriages / Stillbirths Maternal Grandmother   . Stroke Maternal  Grandmother   . Depression Mother        per prenatal  . Hypertension Mother   . Depression Father        per prenatal  . Hearing loss Father   . Hypertension Father   . Hyperlipidemia Father   . Depression Brother   . Heart attack Maternal Grandfather   . Heart disease Maternal Grandfather   . Hypertension Maternal Grandfather   . Heart attack Paternal Grandfather   . Heart disease Paternal Grandfather     Social History   Socioeconomic History  . Marital status: Married    Spouse name: Not on file  . Number of children: Not on file  . Years of education: Not on file  . Highest education level: Not on file  Occupational History  . Not on file  Tobacco Use  . Smoking status: Former    Pack years: 0.00    Types: Cigarettes    Quit date: 11/13/1999    Years since quitting: 21.5  . Smokeless tobacco: Never  Vaping Use  . Vaping Use: Never used  Substance and Sexual Activity  . Alcohol use: Yes    Comment: 2 drinks per weekend beer and liquor  . Drug use: Yes    Types: Marijuana    Comment: couple times a month  . Sexual activity: Yes  Birth control/protection: Surgical    Comment: tubal ligation  Other Topics Concern  . Not on file  Social History Narrative   Evelena Peat -- therapist with [employer]   45 y/o [place]   45 y/o [place]   Social Determinants of Health   Financial Resource Strain: Not on [employer] Insecurity: Not on file  Transportation Needs: Not on file  Physical Activity: Not on file  Stress: Not on file  Social Connections: Not on file  Intimate Partner Violence: Not on file    Outpatient Medications Prior to Visit  Medication Sig Dispense Refill  . ibuprofen (ADVIL,MOTRIN) 600 MG tablet Take 1 tablet (600 mg total) by mouth every 6 (six) hours. 30 tablet 1  . loratadine (CLARITIN) 10 MG tablet Take 10 mg by mouth daily.    . Multiple Vitamins-Minerals (CENTRUM WOMEN PO) Take 1 tablet by mouth daily.    . Multiple Vitamins-Minerals (HAIR  SKIN AND NAILS FORMULA) TABS Take 2 each by mouth daily.    . TRI FEMYNOR 0.18/0.215/0.25 MG-35 MCG tablet Take 1 tablet by mouth daily.     . citalopram (CELEXA) 20 MG tablet TAKE 1 TABLET BY MOUTH DAILY 90 tablet 1  . LORazepam (ATIVAN) 0.5 MG tablet TAKE 1 TABLET BY MOUTH DAILY AS NEEDED FOR ANXIETY 30 tablet 1   No facility-administered medications prior to visit.    Allergies  Allergen Reactions  . Augmentin [Amoxicillin-Pot Clavulanate]   . Sulfa Antibiotics     ROS Review of Systems  Constitutional:  Positive for fatigue.  Psychiatric/Behavioral:  The patient is nervous/anxious.   All other systems reviewed and are negative.    Objective:    Physical Exam Vitals and nursing note reviewed. Exam conducted with a chaperone present.  Constitutional:      Appearance: Normal appearance.  HENT:     Head: Normocephalic and atraumatic.     Right Ear: Tympanic membrane, ear canal and external ear normal.     Left Ear: Tympanic membrane, ear canal and external ear normal.     Nose: Nose normal.     Mouth/Throat:     Mouth: Mucous membranes are moist.  Eyes:     Extraocular Movements: Extraocular movements intact.     Conjunctiva/sclera: Conjunctivae normal.     Pupils: Pupils are equal, round, and reactive to light.  Cardiovascular:     Rate and Rhythm: Normal rate and regular rhythm.     Pulses: Normal pulses.     Heart sounds: Normal heart sounds.  Pulmonary:     Effort: Pulmonary effort is normal.     Breath sounds: Normal breath sounds.  Abdominal:     General: Abdomen is flat. Bowel sounds are normal.     Palpations: Abdomen is soft.  Musculoskeletal:        General: Normal range of motion.     Cervical back: Normal range of motion and neck supple.  Skin:    General: Skin is warm and dry.  Neurological:     General: No focal deficit present.     Mental Status: She is alert and oriented to person, place, and time.  Psychiatric:        Mood and Affect: Mood  normal.        Behavior: Behavior normal.   BP 124/80   Pulse 86   Temp 98.3 F (36.8 C) (Temporal)   Ht 5\' 5"  (1.651 m)   Wt 182 lb 3.2 oz (82.6 kg)   LMP 05/17/2021 (  Approximate)   SpO2 100%   BMI 30.32 kg/m  Wt Readings from Last 3 Encounters:  05/31/21 182 lb 3.2 oz (82.6 kg)  05/12/20 172 lb 8 oz (78.2 kg)  02/18/19 190 lb (86.2 kg)     Health Maintenance Due  Topic Date Due  . Hepatitis C Screening  Never done  . MAMMOGRAM  04/07/2021    There are no preventive care reminders to display for this patient.  Lab Results  Component Value Date   TSH 1.08 04/17/2018   Lab Results  Component Value Date   WBC 8.4 05/12/2020   HGB 13.8 05/12/2020   HCT 42.0 05/12/2020   MCV 82.7 05/12/2020   PLT 305.0 05/12/2020   Lab Results  Component Value Date   NA 137 05/12/2020   K 4.8 05/12/2020   CO2 28 05/12/2020   GLUCOSE 93 05/12/2020   BUN 13 05/12/2020   CREATININE 0.72 05/12/2020   BILITOT 0.3 05/12/2020   ALKPHOS 40 05/12/2020   AST 13 05/12/2020   ALT 11 05/12/2020   PROT 7.3 05/12/2020   ALBUMIN 4.5 05/12/2020   CALCIUM 10.0 05/12/2020   GFR 88.75 05/12/2020   Lab Results  Component Value Date   CHOL 245 (H) 05/12/2020   Lab Results  Component Value Date   HDL 96.10 05/12/2020   Lab Results  Component Value Date   LDLCALC 128 (H) 05/12/2020   Lab Results  Component Value Date   TRIG 100.0 05/12/2020   Lab Results  Component Value Date   CHOLHDL 3 05/12/2020   No results found for: HGBA1C    Assessment & Plan:   Problem List Items Addressed This Visit     Anxiety   Relevant Medications   LORazepam (ATIVAN) 0.5 MG tablet   citalopram (CELEXA) 20 MG tablet   Other Relevant Orders   CBC with Differential   TSH   B12   Other Visit Diagnoses     Other fatigue    -  Primary   Relevant Orders   CBC with Differential   Comprehensive metabolic panel   Lipid panel   TSH   B12   Routine general medical examination at a health care  facility       Relevant Orders   CBC with Differential   Comprehensive metabolic panel   Lipid panel   TSH       Meds ordered this encounter  Medications  . LORazepam (ATIVAN) 0.5 MG tablet    Sig: TAKE 1 TABLET BY MOUTH DAILY AS NEEDED FOR ANXIETY    Dispense:  30 tablet    Refill:  1  . citalopram (CELEXA) 20 MG tablet    Sig: TAKE 1 TABLET BY MOUTH DAILY    Dispense:  90 tablet    Refill:  1    Follow-up: Return in 6 months (on 11/30/2021).    Kennyth Arnold, FNP

## 2021-06-01 ENCOUNTER — Other Ambulatory Visit (HOSPITAL_COMMUNITY): Payer: Self-pay

## 2021-06-01 LAB — CBC WITH DIFFERENTIAL/PLATELET
Basophils Absolute: 0.1 10*3/uL (ref 0.0–0.1)
Basophils Relative: 0.6 % (ref 0.0–3.0)
Eosinophils Absolute: 0 10*3/uL (ref 0.0–0.7)
Eosinophils Relative: 0.5 % (ref 0.0–5.0)
HCT: 38.9 % (ref 36.0–46.0)
Hemoglobin: 12.8 g/dL (ref 12.0–15.0)
Lymphocytes Relative: 36.2 % (ref 12.0–46.0)
Lymphs Abs: 3 10*3/uL (ref 0.7–4.0)
MCHC: 32.9 g/dL (ref 30.0–36.0)
MCV: 82.5 fl (ref 78.0–100.0)
Monocytes Absolute: 0.7 10*3/uL (ref 0.1–1.0)
Monocytes Relative: 8.5 % (ref 3.0–12.0)
Neutro Abs: 4.5 10*3/uL (ref 1.4–7.7)
Neutrophils Relative %: 54.2 % (ref 43.0–77.0)
Platelets: 279 10*3/uL (ref 150.0–400.0)
RBC: 4.72 Mil/uL (ref 3.87–5.11)
RDW: 13.5 % (ref 11.5–15.5)
WBC: 8.3 10*3/uL (ref 4.0–10.5)

## 2021-06-01 LAB — COMPREHENSIVE METABOLIC PANEL
ALT: 11 U/L (ref 0–35)
AST: 15 U/L (ref 0–37)
Albumin: 4.2 g/dL (ref 3.5–5.2)
Alkaline Phosphatase: 36 U/L — ABNORMAL LOW (ref 39–117)
BUN: 17 mg/dL (ref 6–23)
CO2: 25 mEq/L (ref 19–32)
Calcium: 9.5 mg/dL (ref 8.4–10.5)
Chloride: 101 mEq/L (ref 96–112)
Creatinine, Ser: 0.74 mg/dL (ref 0.40–1.20)
GFR: 99.21 mL/min (ref >60.00)
Glucose, Bld: 80 mg/dL (ref 70–99)
Potassium: 4.1 mEq/L (ref 3.5–5.1)
Sodium: 137 mEq/L (ref 135–145)
Total Bilirubin: 0.2 mg/dL (ref 0.2–1.2)
Total Protein: 7.4 g/dL (ref 6.0–8.3)

## 2021-06-01 LAB — LIPID PANEL
Cholesterol: 228 mg/dL — ABNORMAL HIGH (ref 0–200)
HDL: 89.5 mg/dL (ref >39.00)
LDL Cholesterol: 117 mg/dL — ABNORMAL HIGH (ref 0–99)
NonHDL: 138.41
Total CHOL/HDL Ratio: 3
Triglycerides: 106 mg/dL (ref 0.0–149.0)
VLDL: 21.2 mg/dL (ref 0.0–40.0)

## 2021-06-01 LAB — TSH: TSH: 1.44 u[IU]/mL (ref 0.35–4.50)

## 2021-06-01 LAB — VITAMIN B12: Vitamin B-12: 249 pg/mL (ref 211–911)

## 2021-06-02 ENCOUNTER — Other Ambulatory Visit (HOSPITAL_COMMUNITY): Payer: Self-pay

## 2021-06-04 ENCOUNTER — Telehealth: Payer: Self-pay

## 2021-06-04 NOTE — Telephone Encounter (Signed)
Patient is returning a call from [facility] about lab results.

## 2021-06-05 NOTE — Telephone Encounter (Signed)
Lvm for pt to review lab note on My Chart or call the office back if she is unable to.

## 2021-06-06 ENCOUNTER — Other Ambulatory Visit (HOSPITAL_COMMUNITY): Payer: Self-pay

## 2021-06-06 NOTE — Telephone Encounter (Signed)
Left message on voicemail to call office. Also sent My Chart message with response to labs from Dr. Jerline Pain.

## 2021-06-06 NOTE — Telephone Encounter (Signed)
Looks like Marissa Gonzalez ordered her labs. Only significant abnormality is that her cholesterol levels are borderline. She doesn't need meds but she should continue diet and exercise and Sam can recheck in a year.  Algis Greenhouse. Jerline Pain, MD 06/06/2021 10:11 AM

## 2021-06-06 NOTE — Telephone Encounter (Signed)
Dr. Jerline Pain, please review lab results and advise.

## 2021-06-21 DIAGNOSIS — Z1231 Encounter for screening mammogram for malignant neoplasm of breast: Secondary | ICD-10-CM | POA: Diagnosis not present

## 2021-07-12 ENCOUNTER — Ambulatory Visit
Admission: RE | Admit: 2021-07-12 | Discharge: 2021-07-12 | Disposition: A | Discharge status: Home / Self Care | Payer: 59 | Source: Ambulatory Visit | Attending: Emergency Medicine | Admitting: Emergency Medicine

## 2021-07-12 ENCOUNTER — Other Ambulatory Visit: Payer: Self-pay

## 2021-07-12 VITALS — BP 152/92 | HR 84 | Temp 98.2°F | Resp 18

## 2021-07-12 DIAGNOSIS — R490 Dysphonia: Secondary | ICD-10-CM

## 2021-07-12 DIAGNOSIS — J029 Acute pharyngitis, unspecified: Secondary | ICD-10-CM | POA: Diagnosis not present

## 2021-07-12 MED ORDER — PREDNISONE 50 MG PO TABS: 50.0000 mg | ORAL_TABLET | Freq: Every day | ORAL | 0 refills | Status: AC

## 2021-07-12 NOTE — ED Triage Notes (Signed)
Pt sts sore throat and loss of voice x 3 days; denies fever; neg covid testing

## 2021-07-12 NOTE — ED Provider Notes (Signed)
[facility]    CSN: [id number] Arrival date & time: 07/12/21  1202      History   Chief Complaint Chief Complaint  Patient presents with   Appointment    1215   Sore Throat    HPI Marissa Gonzalez is a 45 y.o. female presenting today for evaluation of sore throat.  Reports sore throat for the past 3 days.  Reports slight postnasal drainage.  History of similar in the past.  At home COVID test negative.  Takes daily Claritin.  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  Denies fevers.  Minimal congestion/cough.  HPI  Past Medical History:  Diagnosis Date   Abnormal Pap smear    Anxiety    Fibroid 2012   this pregnancy   GERD (gastroesophageal reflux disease)    pregnancy related   Headache(784.0)    Heart murmur    no problems   Hx of migraines    as teenager   Lactose intolerance    Sciatic nerve pain 2012   this pregnancy    Patient Active Problem List   Diagnosis Date Noted   Uterine leiomyoma 04/07/2020   Anxiety 04/17/2018   IUFD (intrauterine fetal death) June 29, 2014    Past Surgical History:  Procedure Laterality Date   CESAREAN SECTION  03/10/2012   Procedure: CESAREAN SECTION;  Surgeon: Cyril Mourning, MD;x 1  Location: [facility];  Service: Gynecology;  Laterality: N/A;   CESAREAN SECTION  08/08/2015   CESAREAN SECTION WITH BILATERAL TUBAL LIGATION Bilateral 08/08/2015   Procedure: CESAREAN SECTION WITH Left Partial Salpingectomy, and Right Ligation of Fallopian tube with Filshie Clip.;  Surgeon: Marylynn Pearson, MD;  Location: [facility];  Service: Obstetrics;  Laterality: Bilateral;  edc 08/12/15 [name] RNFA confirmed 7/29...tms    dental implant  1995   NO PAST SURGERIES     TUBAL LIGATION  08/08/2015   WISDOM TOOTH EXTRACTION  1985    OB History     Gravida  4   Para  3   Term  2   Preterm  1   AB  1   Living  2      SAB  1   IAB      Ectopic      Multiple   0   Live Births  2            Home Medications    Prior to Admission medications   Medication Sig Start Date End Date Taking? Authorizing Provider  predniSONE (DELTASONE) 50 MG tablet Take 1 tablet (50 mg total) by mouth daily with breakfast for 5 days. 07/12/21 07/17/21 Yes Matalyn Nawaz C, PA-C  citalopram (CELEXA) 20 MG tablet TAKE 1 TABLET BY MOUTH DAILY 05/31/21 05/31/22  Dutch Quint B, FNP  ibuprofen (ADVIL,MOTRIN) 600 MG tablet Take 1 tablet (600 mg total) by mouth every 6 (six) hours. 08/10/15   Juanda Chance, NP  loratadine (CLARITIN) 10 MG tablet Take 10 mg by mouth daily.    [provider]  LORazepam (ATIVAN) 0.5 MG tablet TAKE 1 TABLET BY MOUTH DAILY AS NEEDED FOR ANXIETY 05/31/21 11/27/21  Dutch Quint B, FNP  Multiple Vitamins-Minerals (CENTRUM WOMEN PO) Take 1 tablet by mouth daily.    [provider]  Multiple Vitamins-Minerals (HAIR SKIN AND NAILS FORMULA) TABS Take 2 each by mouth daily.    [provider]  [facility] 0.18/0.215/0.25 MG-35 MCG tablet Take  1 tablet by mouth daily.  02/26/18   [provider]    Family History Family History  Problem Relation Age of Onset   Cancer Maternal Grandmother    Arthritis Maternal Grandmother    Miscarriages / Stillbirths Maternal Grandmother    Stroke Maternal Grandmother    Depression Mother        per prenatal   Hypertension Mother    Depression Father        per prenatal   Hearing loss Father    Hypertension Father    Hyperlipidemia Father    Depression Brother    Heart attack Maternal Grandfather    Heart disease Maternal Grandfather    Hypertension Maternal Grandfather    Heart attack Paternal Grandfather    Heart disease Paternal Grandfather     Social History Social History   Tobacco Use   Smoking status: Former    Types: Cigarettes    Quit date: 11/13/1999    Years since quitting: 21.6   Smokeless tobacco: Never  Vaping Use   Vaping Use: Never used  Substance  Use Topics   Alcohol use: Yes    Comment: 2 drinks per weekend beer and liquor   Drug use: Yes    Types: Marijuana    Comment: couple times a month     Allergies   Augmentin [amoxicillin-pot clavulanate] and Sulfa antibiotics   Review of Systems Review of Systems  Constitutional:  Negative for activity change, appetite change, chills, fatigue and fever.  HENT:  Positive for sore throat and voice change. Negative for congestion, ear pain, rhinorrhea, sinus pressure and trouble swallowing.   Eyes:  Negative for discharge and redness.  Respiratory:  Negative for cough, chest tightness and shortness of breath.   Cardiovascular:  Negative for chest pain.  Gastrointestinal:  Negative for abdominal pain, diarrhea, nausea and vomiting.  Musculoskeletal:  Negative for myalgias.  Skin:  Negative for rash.  Neurological:  Negative for dizziness, light-headedness and headaches.    Physical Exam Triage Vital Signs ED Triage Vitals [07/12/21 1214]  Enc Vitals Group     BP (!) 152/92     Pulse Rate 84     Resp 18     Temp 98.2 F (36.8 C)     Temp Source Oral     SpO2 98 %     Weight      Height      Head Circumference      Peak Flow      Pain Score 5     Pain Loc      Pain Edu?      Excl. in [place]?    No data found.  Updated Vital Signs BP (!) 152/92 (BP Location: Right Arm)   Pulse 84   Temp 98.2 F (36.8 C) (Oral)   Resp 18   SpO2 98%   Visual Acuity Right Eye Distance:   Left Eye Distance:   Bilateral Distance:    Right Eye Near:   Left Eye Near:    Bilateral Near:     Physical Exam Vitals and nursing note reviewed.  Constitutional:      Appearance: She is well-developed.     Comments: No acute distress  HENT:     Head: Normocephalic and atraumatic.     Ears:     Comments: Bilateral ears without tenderness to palpation of external auricle, tragus and mastoid, EAC's without erythema or swelling, TM's with good bony landmarks and cone of light. Non  erythematous.      Nose: Nose normal.     Mouth/Throat:     Comments: Oral mucosa pink and moist, no tonsillar enlargement or exudate. Posterior pharynx patent and erythematous, cobblestoning present, no uvula deviation or swelling.  Mild hoarseness to voice Eyes:     Conjunctiva/sclera: Conjunctivae normal.  Cardiovascular:     Rate and Rhythm: Normal rate.  Pulmonary:     Effort: Pulmonary effort is normal. No respiratory distress.     Comments: Breathing comfortably at rest, CTABL, no wheezing, rales or other adventitious sounds auscultated  Abdominal:     General: There is no distension.  Musculoskeletal:        General: Normal range of motion.     Cervical back: Neck supple.  Skin:    General: Skin is warm and dry.  Neurological:     Mental Status: She is alert and oriented to person, place, and time.     UC Treatments / Results  Labs (all labs ordered are listed, but only abnormal results are displayed) Labs Reviewed - No data to display  EKG   Radiology No results found.  Procedures Procedures (including critical care time)  Medications Ordered in UC Medications - No data to display  Initial Impression / Assessment and Plan / UC Course  I have reviewed the triage vital signs and the nursing notes.  Pertinent labs & imaging results that were available during my care of the patient were reviewed by me and considered in my medical decision making (see chart for details).     Sore throat-prior COVID test negative, exam reassuring, not suggestive of strep or tonsillitis, suspect likely viral sore throat related to postnasal drainage, recommending continued symptomatic and supportive care, will provide prednisone course to try and help with hoarseness, encourage fluids, honey and hot tea.  Continue daily antihistamine.  Discussed strict return precautions. Patient verbalized understanding and is agreeable with plan.  Final Clinical Impressions(s) / UC Diagnoses    Final diagnoses:  Sore throat  Hoarseness     Discharge Instructions      Sore Throat  Prednisone daily for 5 days - with food  Please continue Tylenol or Ibuprofen for fever and pain. May try salt water gargles, cepacol lozenges, throat spray, or OTC cold relief medicine for throat discomfort. If you also have congestion take a daily anti-histamine like Zyrtec, Claritin, and a oral decongestant to help with post nasal drip that may be irritating your throat.   Stay hydrated and drink plenty of fluids to keep your throat coated relieve irritation.    ED Prescriptions     Medication Sig Dispense Auth. Provider   predniSONE (DELTASONE) 50 MG tablet Take 1 tablet (50 mg total) by mouth daily with breakfast for 5 days. 5 tablet Darral Rishel, Eek C, PA-C      PDMP not reviewed this encounter.   Janith Lima, [place] 07/12/21 1243

## 2021-07-12 NOTE — Discharge Instructions (Addendum)
Sore Throat  Prednisone daily for 5 days - with food  Please continue Tylenol or Ibuprofen for fever and pain. May try salt water gargles, cepacol lozenges, throat spray, or OTC cold relief medicine for throat discomfort. If you also have congestion take a daily anti-histamine like Zyrtec, Claritin, and a oral decongestant to help with post nasal drip that may be irritating your throat.   Stay hydrated and drink plenty of fluids to keep your throat coated relieve irritation.

## 2021-07-30 DIAGNOSIS — Z6831 Body mass index (BMI) 31.0-31.9, adult: Secondary | ICD-10-CM | POA: Diagnosis not present

## 2021-07-30 DIAGNOSIS — Z3040 Encounter for surveillance of contraceptives, unspecified: Secondary | ICD-10-CM | POA: Diagnosis not present

## 2021-07-30 DIAGNOSIS — Z9189 Other specified personal risk factors, not elsewhere classified: Secondary | ICD-10-CM | POA: Diagnosis not present

## 2021-07-30 DIAGNOSIS — Z01419 Encounter for gynecological examination (general) (routine) without abnormal findings: Secondary | ICD-10-CM | POA: Diagnosis not present

## 2021-07-31 ENCOUNTER — Other Ambulatory Visit: Payer: Self-pay | Admitting: Obstetrics and Gynecology

## 2021-07-31 ENCOUNTER — Other Ambulatory Visit (HOSPITAL_COMMUNITY): Payer: Self-pay | Admitting: Obstetrics and Gynecology

## 2021-07-31 DIAGNOSIS — Z9189 Other specified personal risk factors, not elsewhere classified: Secondary | ICD-10-CM

## 2021-09-10 ENCOUNTER — Other Ambulatory Visit (HOSPITAL_COMMUNITY): Payer: Self-pay

## 2021-09-24 DIAGNOSIS — H5213 Myopia, bilateral: Secondary | ICD-10-CM | POA: Diagnosis not present

## 2021-09-24 DIAGNOSIS — H52203 Unspecified astigmatism, bilateral: Secondary | ICD-10-CM | POA: Diagnosis not present

## 2021-11-01 ENCOUNTER — Other Ambulatory Visit (HOSPITAL_COMMUNITY): Payer: Self-pay

## 2021-11-01 ENCOUNTER — Encounter: Payer: Self-pay | Admitting: Physician Assistant

## 2021-11-01 MED ORDER — LORAZEPAM 0.5 MG PO TABS
ORAL_TABLET | Freq: Every day | ORAL | 2 refills | Status: DC | PRN
  Filled 2021-11-01: qty 30, 30d supply, fill #0

## 2021-11-01 NOTE — Telephone Encounter (Signed)
Pt requesting refill for Lorazepam last OV 05/2021.

## 2021-12-01 ENCOUNTER — Other Ambulatory Visit: Payer: Self-pay | Admitting: Family

## 2021-12-03 ENCOUNTER — Other Ambulatory Visit: Payer: Self-pay | Admitting: Family

## 2021-12-03 ENCOUNTER — Ambulatory Visit (HOSPITAL_COMMUNITY)
Admission: RE | Admit: 2021-12-03 | Discharge: 2021-12-03 | Disposition: A | Discharge status: Home / Self Care | Payer: 59 | Source: Ambulatory Visit | Attending: Obstetrics and Gynecology | Admitting: Obstetrics and Gynecology

## 2021-12-03 ENCOUNTER — Other Ambulatory Visit (HOSPITAL_COMMUNITY): Payer: Self-pay

## 2021-12-03 ENCOUNTER — Other Ambulatory Visit: Payer: Self-pay

## 2021-12-03 DIAGNOSIS — Z9189 Other specified personal risk factors, not elsewhere classified: Secondary | ICD-10-CM | POA: Diagnosis not present

## 2021-12-03 DIAGNOSIS — Z803 Family history of malignant neoplasm of breast: Secondary | ICD-10-CM | POA: Diagnosis not present

## 2021-12-03 DIAGNOSIS — Z1239 Encounter for other screening for malignant neoplasm of breast: Secondary | ICD-10-CM | POA: Insufficient documentation

## 2021-12-03 IMAGING — MR MR BREAST BILAT WO/W CM
7 of 10 series · 30 of 48 positions shown · IV contrast (8ML GADAVIST)
Comparison: Previous exam(s).

CLINICAL DATA: High risk screening MRI due to genetic factors.
Family history of breast cancer.

EXAM:
BILATERAL BREAST MRI WITH AND WITHOUT CONTRAST
TECHNIQUE: Multiplanar, multisequence MR images of both breasts were obtained
prior to and following the intravenous administration of 8 ml of
Gadavist

[Series 2: T2 · axial · 3.0mm · 0.96mm/px · 1 of 62 slices shown]
[im 1/62]
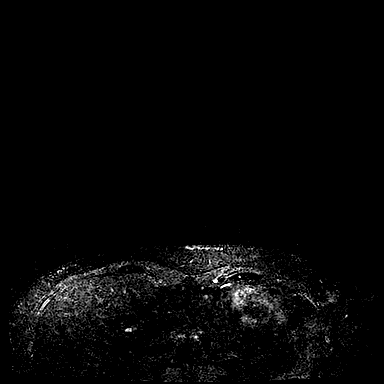

[Series 3: T1 fat-sat · axial · 1.2mm · 0.83mm/px · z∈[-75,+135]mm · 5 of 175 slices shown (1 of 4)]
[im 1/175]
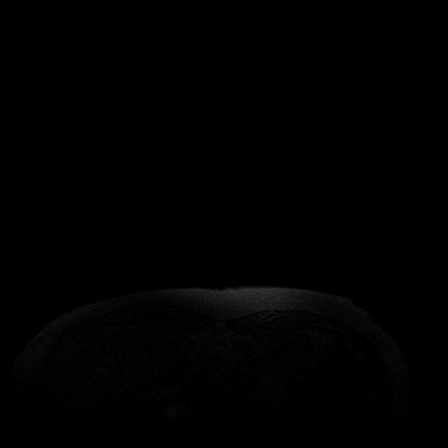
[im 44/175]
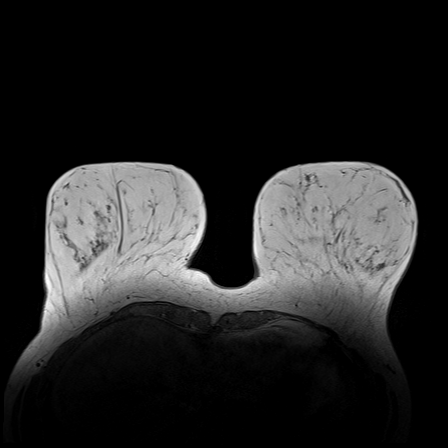
[im 88/175]
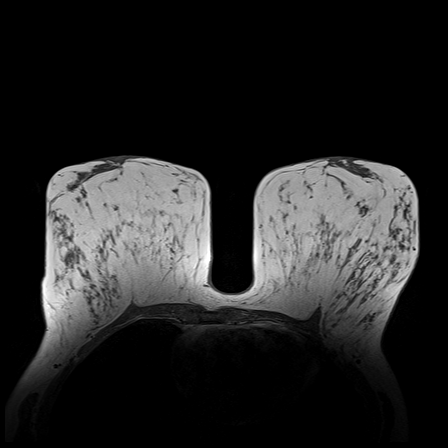
[im 131/175]
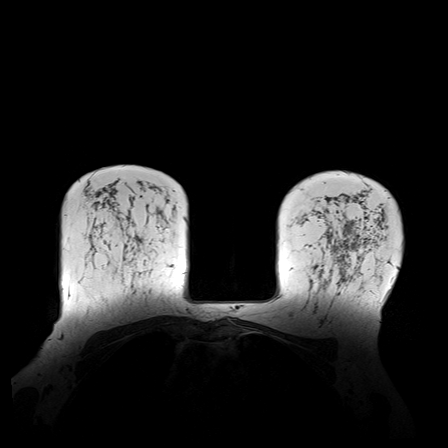
[im 175/175]
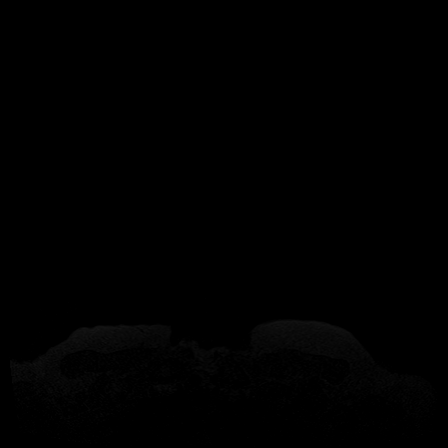

[Series 5: T1 fat-sat · axial · 1.2mm · 0.89mm/px · z∈[-75,+135]mm · 5 of 170 slices shown (2 of 4)]
[im 1/170]
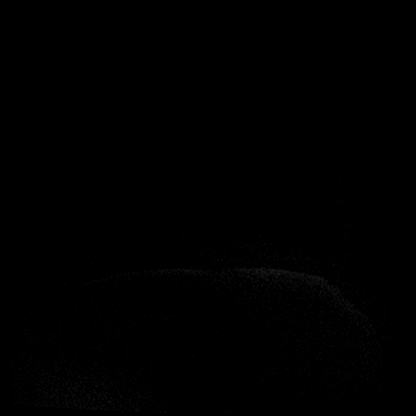
[im 43/170]
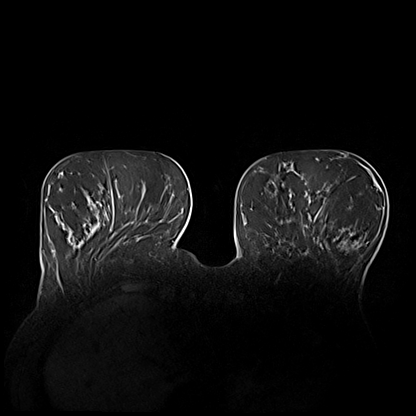
[im 85/170]
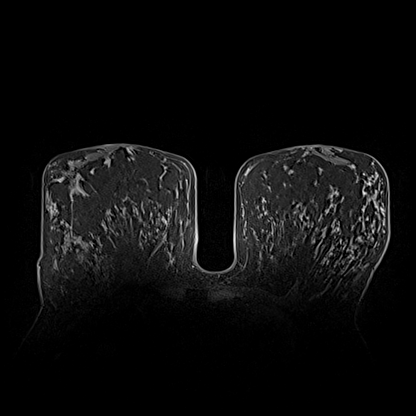
[im 127/170]
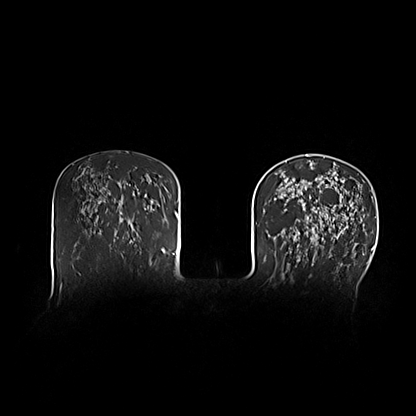
[im 170/170]
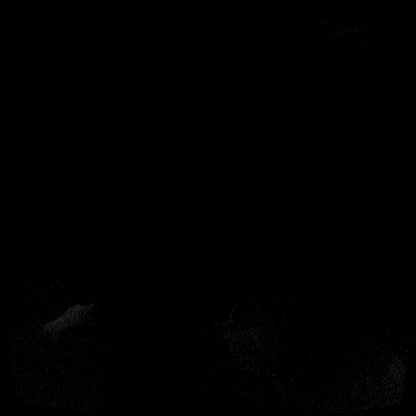

[Series 6: T1 fat-sat · axial · 1.2mm · 0.89mm/px · z∈[-75,+135]mm · 6 of 170 slices shown (3 of 4)]
[im 1/170]
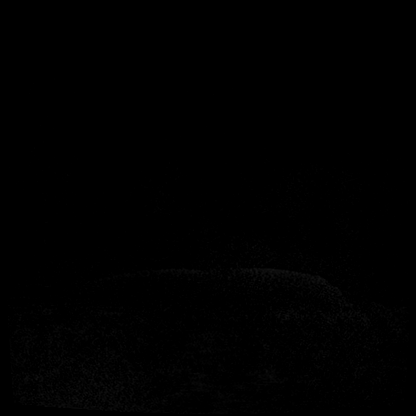
[im 34/170]
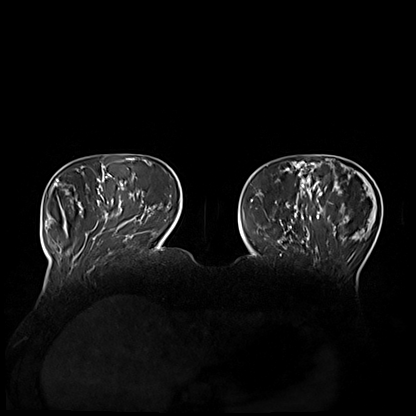
[im 68/170]
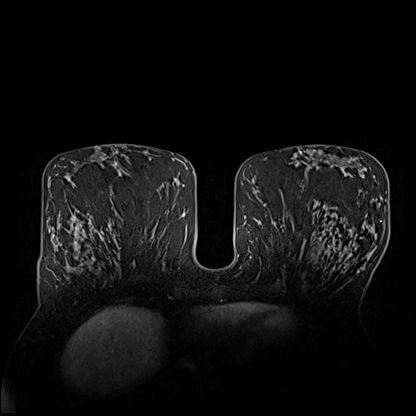
[im 102/170]
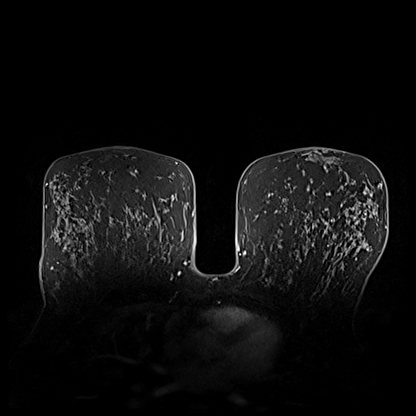
[im 136/170]
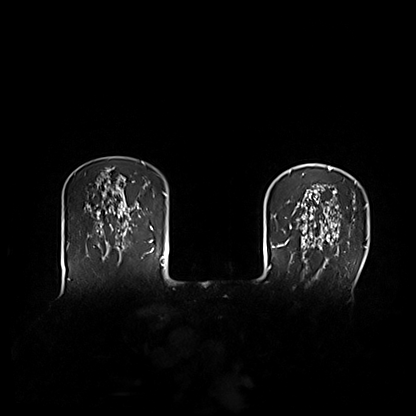
[im 170/170]
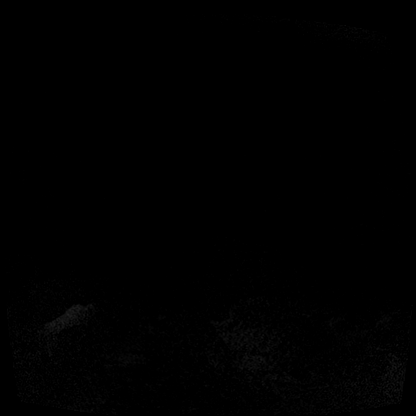

[Series 7: T1 · axial · 1.2mm · 0.89mm/px · z∈[-75,+135]mm · 6 of 176 slices shown (1 of 2)]
[im 1/176]
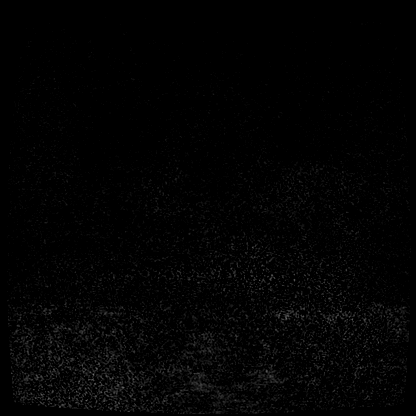
[im 36/176]
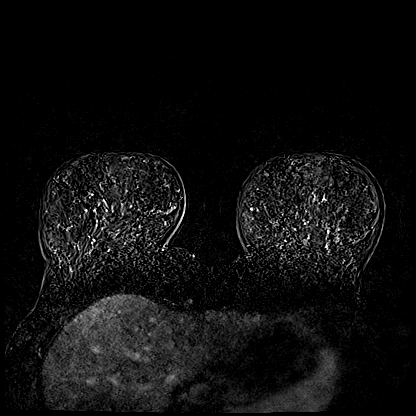
[im 71/176]
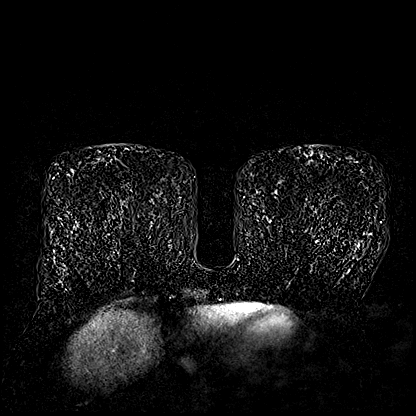
[im 106/176]
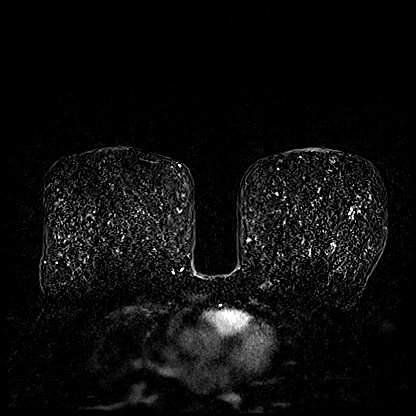
[im 141/176]
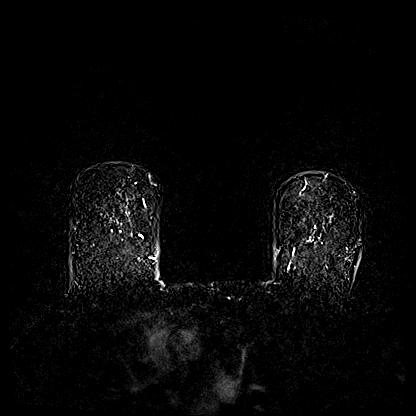
[im 176/176]
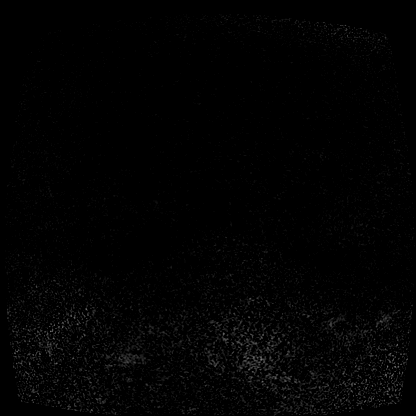

[Series 8: T1 · coronal · 370.0mm · 0.89mm/px · 1 of 3 slices shown (2 of 2)]
[im 1/3]
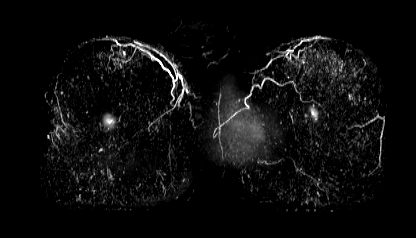

[Series 10: T1 fat-sat · axial · 1.2mm · 0.89mm/px · z∈[-75,+135]mm · 6 of 168 slices shown (4 of 4)]
[im 1/168]
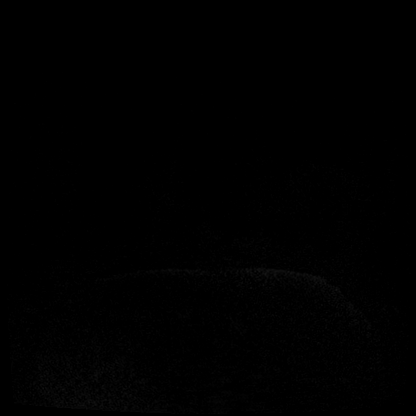
[im 34/168]
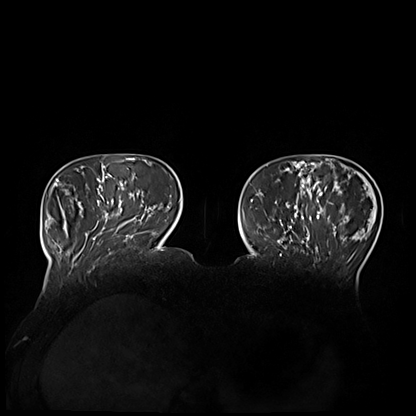
[im 67/168]
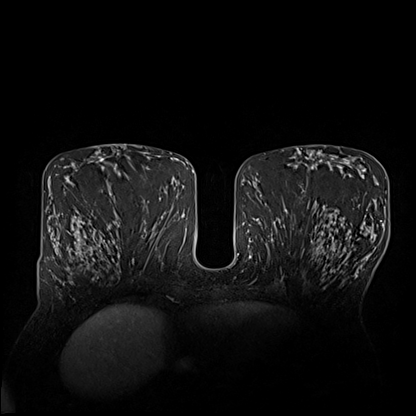
[im 101/168]
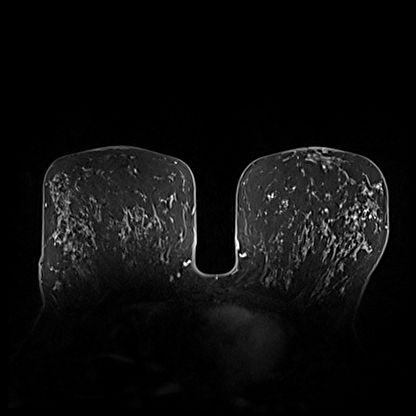
[im 134/168]
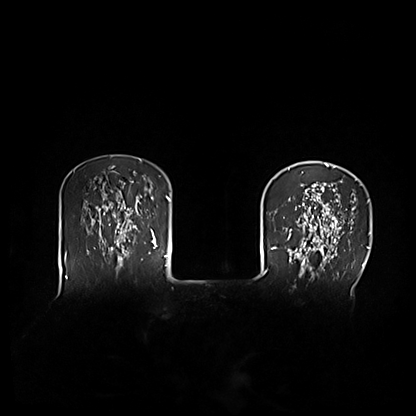
[im 168/168]
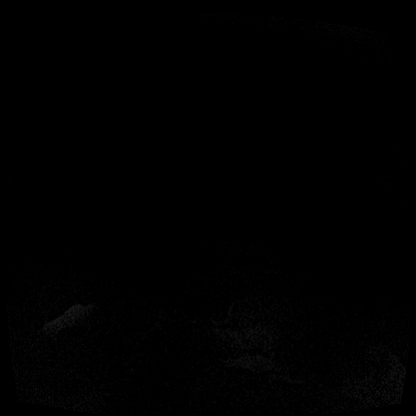

[30 of 48 positions shown; findings below may reference images not displayed]

Three-dimensional MR images were rendered by post-processing of the
original MR data on an independent workstation. The
three-dimensional MR images were interpreted, and findings are
reported in the following complete MRI report for this study. Three
dimensional images were evaluated at the independent interpreting
workstation using the DynaCAD thin client.
FINDINGS: Breast composition: c. Heterogeneous fibroglandular tissue.

Background parenchymal enhancement: Moderate.

Right breast: No mass or abnormal enhancement.

Left breast: No mass or abnormal enhancement.

Lymph nodes: No abnormal appearing lymph nodes.

Ancillary findings:  None.
IMPRESSION: No abnormal enhancement in either breast.

RECOMMENDATION:
Bilateral screening mammogram in [DATE] is recommended. The
American Cancer Society recommends annual MRI and mammography in
patients with an estimated lifetime risk of developing breast cancer
greater than 20 - 25%, or who are known or suspected to be positive
for the breast cancer gene.

BI-RADS CATEGORY  1: Negative.

## 2021-12-03 MED ORDER — GADOBUTROL 1 MMOL/ML IV SOLN
8.0000 mL | Freq: Once | INTRAVENOUS | Status: AC | PRN
  Administered 2021-12-03: 8 mL via INTRAVENOUS

## 2021-12-06 ENCOUNTER — Other Ambulatory Visit (HOSPITAL_COMMUNITY): Payer: Self-pay

## 2021-12-06 ENCOUNTER — Other Ambulatory Visit: Payer: Self-pay | Admitting: Family

## 2021-12-07 ENCOUNTER — Other Ambulatory Visit (HOSPITAL_COMMUNITY): Payer: Self-pay

## 2021-12-07 MED ORDER — CITALOPRAM HYDROBROMIDE 20 MG PO TABS
ORAL_TABLET | Freq: Every day | ORAL | 0 refills | Status: DC
  Filled 2021-12-07: qty 90, 90d supply, fill #0

## 2022-03-24 ENCOUNTER — Encounter: Payer: Self-pay | Admitting: Physician Assistant

## 2022-03-25 ENCOUNTER — Other Ambulatory Visit: Payer: Self-pay | Admitting: *Deleted

## 2022-03-25 ENCOUNTER — Other Ambulatory Visit (HOSPITAL_COMMUNITY): Payer: Self-pay

## 2022-03-25 MED ORDER — CITALOPRAM HYDROBROMIDE 20 MG PO TABS
ORAL_TABLET | Freq: Every day | ORAL | 0 refills | Status: DC
  Filled 2022-03-25: qty 90, 90d supply, fill #0

## 2022-03-25 MED ORDER — LORAZEPAM 0.5 MG PO TABS
ORAL_TABLET | Freq: Every day | ORAL | 2 refills | Status: AC | PRN
Start: 2022-03-25 — End: 2022-09-21
  Filled 2022-03-25: qty 30, 30d supply, fill #0
  Filled 2022-06-17: qty 30, 30d supply, fill #1

## 2022-06-17 ENCOUNTER — Other Ambulatory Visit: Payer: Self-pay | Admitting: Physician Assistant

## 2022-06-17 ENCOUNTER — Other Ambulatory Visit (HOSPITAL_COMMUNITY): Payer: Self-pay

## 2022-06-17 MED ORDER — CITALOPRAM HYDROBROMIDE 20 MG PO TABS
ORAL_TABLET | Freq: Every day | ORAL | 0 refills | Status: DC
  Filled 2022-06-17: qty 30, 30d supply, fill #0

## 2022-06-18 ENCOUNTER — Other Ambulatory Visit (HOSPITAL_COMMUNITY): Payer: Self-pay

## 2022-07-29 ENCOUNTER — Other Ambulatory Visit (HOSPITAL_COMMUNITY): Payer: Self-pay

## 2022-07-29 ENCOUNTER — Telehealth: Payer: Self-pay | Admitting: Physician Assistant

## 2022-07-29 MED ORDER — CITALOPRAM HYDROBROMIDE 20 MG PO TABS
ORAL_TABLET | Freq: Every day | ORAL | 0 refills | Status: DC
  Filled 2022-07-29: qty 30, 30d supply, fill #0

## 2022-07-29 NOTE — Telephone Encounter (Signed)
   LAST APPOINTMENT DATE:  Please schedule appointment if longer than 1 year  NEXT APPOINTMENT DATE: 08/09/22  MEDICATION:citalopram (CELEXA) 20 MG tablet  Is the patient out of medication? Yes  PHARMACY: [facility] Phone:  [phone]  Fax:  [phone]      Let patient know to contact pharmacy at the end of the day to make sure medication is ready.  Please notify patient to allow 48-72 hours to process

## 2022-07-29 NOTE — Telephone Encounter (Signed)
Pt notified Rx sent to pharmacy

## 2022-08-09 ENCOUNTER — Encounter: Payer: Self-pay | Admitting: Physician Assistant

## 2022-08-09 ENCOUNTER — Ambulatory Visit (INDEPENDENT_AMBULATORY_CARE_PROVIDER_SITE_OTHER): Payer: 59 | Admitting: Physician Assistant

## 2022-08-09 VITALS — BP 110/76 | HR 75 | Temp 98.0°F | Ht 65.0 in | Wt 190.5 lb

## 2022-08-09 DIAGNOSIS — Z8249 Family history of ischemic heart disease and other diseases of the circulatory system: Secondary | ICD-10-CM | POA: Diagnosis not present

## 2022-08-09 DIAGNOSIS — K219 Gastro-esophageal reflux disease without esophagitis: Secondary | ICD-10-CM | POA: Diagnosis not present

## 2022-08-09 DIAGNOSIS — E669 Obesity, unspecified: Secondary | ICD-10-CM

## 2022-08-09 DIAGNOSIS — Z13220 Encounter for screening for lipoid disorders: Secondary | ICD-10-CM

## 2022-08-09 DIAGNOSIS — Z136 Encounter for screening for cardiovascular disorders: Secondary | ICD-10-CM | POA: Diagnosis not present

## 2022-08-09 DIAGNOSIS — Z0000 Encounter for general adult medical examination without abnormal findings: Secondary | ICD-10-CM

## 2022-08-09 DIAGNOSIS — Z1159 Encounter for screening for other viral diseases: Secondary | ICD-10-CM

## 2022-08-09 DIAGNOSIS — E538 Deficiency of other specified B group vitamins: Secondary | ICD-10-CM | POA: Diagnosis not present

## 2022-08-09 LAB — COMPREHENSIVE METABOLIC PANEL
ALT: 18 U/L (ref 0–35)
AST: 22 U/L (ref 0–37)
Albumin: 4.5 g/dL (ref 3.5–5.2)
Alkaline Phosphatase: 49 U/L (ref 39–117)
BUN: 12 mg/dL (ref 6–23)
CO2: 28 mEq/L (ref 19–32)
Calcium: 10.2 mg/dL (ref 8.4–10.5)
Chloride: 100 mEq/L (ref 96–112)
Creatinine, Ser: 0.7 mg/dL (ref 0.40–1.20)
GFR: 105.17 mL/min (ref >60.00)
Glucose, Bld: 95 mg/dL (ref 70–99)
Potassium: 4 mEq/L (ref 3.5–5.1)
Sodium: 139 mEq/L (ref 135–145)
Total Bilirubin: 0.4 mg/dL (ref 0.2–1.2)
Total Protein: 7.7 g/dL (ref 6.0–8.3)

## 2022-08-09 LAB — CBC WITH DIFFERENTIAL/PLATELET
Basophils Absolute: 0 10*3/uL (ref 0.0–0.1)
Basophils Relative: 0.2 % (ref 0.0–3.0)
Eosinophils Absolute: 0 10*3/uL (ref 0.0–0.7)
Eosinophils Relative: 0.5 % (ref 0.0–5.0)
HCT: 42.5 % (ref 36.0–46.0)
Hemoglobin: 13.6 g/dL (ref 12.0–15.0)
Lymphocytes Relative: 37.9 % (ref 12.0–46.0)
Lymphs Abs: 2.5 10*3/uL (ref 0.7–4.0)
MCHC: 32 g/dL (ref 30.0–36.0)
MCV: 84 fl (ref 78.0–100.0)
Monocytes Absolute: 0.6 10*3/uL (ref 0.1–1.0)
Monocytes Relative: 8.6 % (ref 3.0–12.0)
Neutro Abs: 3.5 10*3/uL (ref 1.4–7.7)
Neutrophils Relative %: 52.8 % (ref 43.0–77.0)
Platelets: 285 10*3/uL (ref 150.0–400.0)
RBC: 5.06 Mil/uL (ref 3.87–5.11)
RDW: 14 % (ref 11.5–15.5)
WBC: 6.6 10*3/uL (ref 4.0–10.5)

## 2022-08-09 LAB — LIPID PANEL
Cholesterol: 252 mg/dL — ABNORMAL HIGH (ref 0–200)
HDL: 87.6 mg/dL (ref >39.00)
LDL Cholesterol: 147 mg/dL — ABNORMAL HIGH (ref 0–99)
NonHDL: 164.37
Total CHOL/HDL Ratio: 3
Triglycerides: 87 mg/dL (ref 0.0–149.0)
VLDL: 17.4 mg/dL (ref 0.0–40.0)

## 2022-08-09 LAB — TSH: TSH: 1.05 u[IU]/mL (ref 0.35–5.50)

## 2022-08-09 LAB — VITAMIN B12: Vitamin B-12: 301 pg/mL (ref 211–911)

## 2022-08-09 NOTE — Progress Notes (Signed)
Subjective:    MEMPHIS CRESWELL is a 46 y.o. female and is here for a comprehensive physical exam.   HPI  Health Maintenance Due  Topic Date Due   Hepatitis C Screening  Never done   PAP SMEAR-Modifier  01/16/2021   MAMMOGRAM  04/07/2021   Acute Concerns: GERD -- had severe heart burn a night during vacation -- woke up at 3am. Had tums, gas-x, started prilosec. Felt bad for like 24 hours.  She had some fatigue, and unsettled stomach for 24 hours.  She has not had this since. Denies exertional symptoms, SOB, palpitations, radiation of pain during this incident.  Chronic Issues: Anxiety -- currently taking celexa 20 mg daily. Tolerating well.  Denies SI/HI.  Takes Ativan as needed.  Health Maintenance: Immunizations -- wants to wait on flu shot Colonoscopy -- n/a Mammogram -- UTD PAP -- UTD Diet -- overall balanced Sleep habits -- no major concerns Exercise -- pilates 2 x a weekly Weight -- Weight: 190 lb 8 oz (86.4 kg)  Mood -- stable Weight history: Wt Readings from Last 10 Encounters:  08/09/22 190 lb 8 oz (86.4 kg)  05/31/21 182 lb 3.2 oz (82.6 kg)  05/12/20 172 lb 8 oz (78.2 kg)  02/18/19 190 lb (86.2 kg)  11/17/18 187 lb 3.2 oz (84.9 kg)  04/24/18 186 lb (84.4 kg)  04/17/18 187 lb 6.1 oz (85 kg)  01/27/18 184 lb 6.4 oz (83.6 kg)  08/08/15 213 lb (96.6 kg)  08/07/15 213 lb (96.6 kg)   Body mass index is 31.7 kg/m. Patient's last menstrual period was 07/30/2022 (exact date). Alcohol use:  reports current alcohol use. Tobacco use:  Tobacco Use: Medium Risk (08/09/2022)   Patient History    Smoking Tobacco Use: Former    Smokeless Tobacco Use: Never    Passive Exposure: Not on file        08/09/2022   10:07 AM  Depression screen PHQ 2/9  Decreased Interest 0  Down, Depressed, Hopeless 0  PHQ - 2 Score 0     Other providers/specialists: Patient Care Team: Inda Coke, [place] as PCP - General (Physician Assistant)    PMHx, SurgHx, SocialHx,  Medications, and Allergies were reviewed in the Visit Navigator and updated as appropriate.   Past Medical History:  Diagnosis Date   Abnormal Pap smear    Anxiety    Fibroid 2012   this pregnancy   GERD (gastroesophageal reflux disease)    pregnancy related   Headache(784.0)    Heart murmur    no problems   Hx of migraines    as teenager   Lactose intolerance    Sciatic nerve pain 2012   this pregnancy     Past Surgical History:  Procedure Laterality Date   CESAREAN SECTION  03/10/2012   Procedure: CESAREAN SECTION;  Surgeon: Cyril Mourning, MD;x 1  Location: [facility];  Service: Gynecology;  Laterality: N/A;   CESAREAN SECTION  08/08/2015   CESAREAN SECTION WITH BILATERAL TUBAL LIGATION Bilateral 08/08/2015   Procedure: CESAREAN SECTION WITH Left Partial Salpingectomy, and Right Ligation of Fallopian tube with Filshie Clip.;  Surgeon: Marylynn Pearson, MD;  Location: [facility];  Service: Obstetrics;  Laterality: Bilateral;  edc 08/12/15 [name] RNFA confirmed 7/29...tms    dental implant  1995   NO PAST SURGERIES     TUBAL LIGATION  08/08/2015   WISDOM TOOTH EXTRACTION  1985     Family History  Problem Relation Age of Onset  Cancer Maternal Grandmother    Arthritis Maternal Grandmother    Miscarriages / Stillbirths Maternal Grandmother    Stroke Maternal Grandmother    Depression Mother        per prenatal   Hypertension Mother    Depression Father        per prenatal   Hearing loss Father    Hypertension Father    Hyperlipidemia Father    Depression Brother    Heart attack Maternal Grandfather    Heart disease Maternal Grandfather    Hypertension Maternal Grandfather    Heart attack Paternal Grandfather    Heart disease Paternal Grandfather     Social History   Tobacco Use   Smoking status: Former    Types: Cigarettes    Quit date: 11/13/1999    Years since quitting: 22.7   Smokeless tobacco: Never  Vaping Use   Vaping Use: Never used  Substance Use  Topics   Alcohol use: Yes    Comment: 2 drinks per weekend beer and liquor   Drug use: Yes    Types: Marijuana    Comment: couple times a month    Review of Systems:   Review of Systems  Constitutional:  Negative for chills, fever, malaise/fatigue and weight loss.  HENT:  Negative for hearing loss, sinus pain and sore throat.   Respiratory:  Negative for cough and hemoptysis.   Cardiovascular:  Negative for chest pain, palpitations, leg swelling and PND.  Gastrointestinal:  Negative for abdominal pain, constipation, diarrhea, heartburn, nausea and vomiting.  Genitourinary:  Negative for dysuria, frequency and urgency.  Musculoskeletal:  Negative for back pain, myalgias and neck pain.  Skin:  Negative for itching and rash.  Neurological:  Negative for dizziness, tingling, seizures and headaches.  Endo/Heme/Allergies:  Negative for polydipsia.  Psychiatric/Behavioral:  Negative for depression. The patient is not nervous/anxious.     Objective:   BP 110/76 (BP Location: Left Arm, Patient Position: Sitting, Cuff Size: Large)   Pulse 75   Temp 98 F (36.7 C) (Temporal)   Ht '5\' 5"'$  (1.651 m)   Wt 190 lb 8 oz (86.4 kg)   LMP 07/30/2022 (Exact Date)   SpO2 100%   BMI 31.70 kg/m  Body mass index is 31.7 kg/m.   General Appearance:    Alert, cooperative, no distress, appears stated age  Head:    Normocephalic, without obvious abnormality, atraumatic  Eyes:    PERRL, conjunctiva/corneas clear, EOM's intact, fundi    benign, both eyes  Ears:    Normal TM's and external ear canals, both ears  Nose:   Nares normal, septum midline, mucosa normal, no drainage    or sinus tenderness  Throat:   Lips, mucosa, and tongue normal; teeth and gums normal  Neck:   Supple, symmetrical, trachea midline, no adenopathy;    thyroid:  no enlargement/tenderness/nodules; no carotid   bruit or JVD  Back:     Symmetric, no curvature, ROM normal, no CVA tenderness  Lungs:     Clear to auscultation  bilaterally, respirations unlabored  Chest Wall:    No tenderness or deformity   Heart:    Regular rate and rhythm, S1 and S2 normal, no murmur, rub or gallop  Breast Exam:  Deferred  Abdomen:     Soft, non-tender, bowel sounds active all four quadrants,    no masses, no organomegaly  Genitalia:  Deferred  Extremities:   Extremities normal, atraumatic, no cyanosis or edema  Pulses:   2+ and  symmetric all extremities  Skin:   Skin color, texture, turgor normal, no rashes or lesions  Lymph nodes:   Cervical, supraclavicular, and axillary nodes normal  Neurologic:   CNII-XII intact, normal strength, sensation and reflexes    throughout    Assessment/Plan:   Routine physical examination Today patient counseled on age appropriate routine health concerns for screening and prevention, each reviewed and up to date or declined. Immunizations reviewed and up to date or declined. Labs ordered and reviewed. Risk factors for depression reviewed and negative. Hearing function and visual acuity are intact. ADLs screened and addressed as needed. Functional ability and level of safety reviewed and appropriate. Education, counseling and referrals performed based on assessed risks today. Patient provided with a copy of personalized plan for preventive services.  Encounter for screening for other viral diseases Update hep C and provide recommendations accordingly  Encounter for lipid screening for cardiovascular disease Update lipid provide recommendations accordingly  Obesity, unspecified classification, unspecified obesity type, unspecified whether serious comorbidity present Continue efforts on healthy diet and exercise  B12 deficiency Update B12 and provide recommendations accordingly  Gastroesophageal reflux disease, unspecified whether esophagitis present This was an isolated incident Due to family history of heart disease, we will go ahead and order a CT calcium score to evaluate blood vessels  around her heart She is to let us know if she has any further episodes of heartburn  Patient Counseling: '[x]'$    Nutrition: Stressed importance of moderation in sodium/caffeine intake, saturated fat and cholesterol, caloric balance, sufficient intake of fresh fruits, vegetables, fiber, calcium, iron, and 1 mg of folate supplement per day (for females capable of pregnancy).  '[x]'$    Stressed the importance of regular exercise.   '[x]'$    Substance Abuse: Discussed cessation/primary prevention of tobacco, alcohol, or other drug use; driving or other dangerous activities under the influence; availability of treatment for abuse.   '[x]'$    Injury prevention: Discussed safety belts, safety helmets, smoke detector, smoking near bedding or upholstery.   '[x]'$    Sexuality: Discussed sexually transmitted diseases, partner selection, use of condoms, avoidance of unintended pregnancy  and contraceptive alternatives.  '[x]'$    Dental health: Discussed importance of regular tooth brushing, flossing, and dental visits.  '[x]'$    Health maintenance and immunizations reviewed. Please refer to Health maintenance section.   Inda Coke, PA-C [place]

## 2022-08-09 NOTE — Patient Instructions (Signed)
It was great to see you!  I'll order the calcium CT scan for your heart - -someone will reach out to you soon.  Please go to the lab for blood work.   Our office will call you with your results unless you have chosen to receive results via MyChart.  If your blood work is normal we will follow-up each year for physicals and as scheduled for chronic medical problems.  If anything is abnormal we will treat accordingly and get you in for a follow-up.  Take care,  Aldona Bar

## 2022-08-12 LAB — HEPATITIS C ANTIBODY: Hepatitis C Ab: NONREACTIVE

## 2022-08-16 DIAGNOSIS — Z1151 Encounter for screening for human papillomavirus (HPV): Secondary | ICD-10-CM | POA: Diagnosis not present

## 2022-08-16 DIAGNOSIS — Z6832 Body mass index (BMI) 32.0-32.9, adult: Secondary | ICD-10-CM | POA: Diagnosis not present

## 2022-08-16 DIAGNOSIS — Z124 Encounter for screening for malignant neoplasm of cervix: Secondary | ICD-10-CM | POA: Diagnosis not present

## 2022-08-16 DIAGNOSIS — Z1231 Encounter for screening mammogram for malignant neoplasm of breast: Secondary | ICD-10-CM | POA: Diagnosis not present

## 2022-08-16 DIAGNOSIS — Z9189 Other specified personal risk factors, not elsewhere classified: Secondary | ICD-10-CM | POA: Diagnosis not present

## 2022-08-16 DIAGNOSIS — Z01419 Encounter for gynecological examination (general) (routine) without abnormal findings: Secondary | ICD-10-CM | POA: Diagnosis not present

## 2022-08-16 LAB — HM MAMMOGRAPHY

## 2022-08-19 ENCOUNTER — Other Ambulatory Visit: Payer: Self-pay | Admitting: Obstetrics and Gynecology

## 2022-08-19 ENCOUNTER — Other Ambulatory Visit (HOSPITAL_COMMUNITY): Payer: Self-pay | Admitting: Obstetrics and Gynecology

## 2022-08-19 DIAGNOSIS — Z9189 Other specified personal risk factors, not elsewhere classified: Secondary | ICD-10-CM

## 2022-08-19 LAB — HM PAP SMEAR: HPV, high-risk: NEGATIVE

## 2022-08-21 ENCOUNTER — Other Ambulatory Visit (HOSPITAL_COMMUNITY): Payer: Self-pay

## 2022-08-21 ENCOUNTER — Encounter: Payer: Self-pay | Admitting: Physician Assistant

## 2022-08-21 ENCOUNTER — Other Ambulatory Visit: Payer: Self-pay | Admitting: Physician Assistant

## 2022-08-21 MED ORDER — CITALOPRAM HYDROBROMIDE 20 MG PO TABS
ORAL_TABLET | Freq: Every day | ORAL | 1 refills | Status: DC
Start: 2022-08-21 — End: 2022-11-26
  Filled 2022-08-21: qty 90, 90d supply, fill #0
  Filled 2022-11-21: qty 90, 90d supply, fill #1

## 2022-08-22 ENCOUNTER — Other Ambulatory Visit (HOSPITAL_COMMUNITY): Payer: Self-pay

## 2022-08-29 DIAGNOSIS — Z30430 Encounter for insertion of intrauterine contraceptive device: Secondary | ICD-10-CM | POA: Diagnosis not present

## 2022-09-10 ENCOUNTER — Ambulatory Visit (HOSPITAL_BASED_OUTPATIENT_CLINIC_OR_DEPARTMENT_OTHER)
Admission: RE | Admit: 2022-09-10 | Discharge: 2022-09-10 | Disposition: A | Discharge status: Home / Self Care | Payer: 59 | Source: Ambulatory Visit | Attending: Physician Assistant | Admitting: Physician Assistant

## 2022-09-10 DIAGNOSIS — Z8249 Family history of ischemic heart disease and other diseases of the circulatory system: Secondary | ICD-10-CM | POA: Insufficient documentation

## 2022-09-11 ENCOUNTER — Encounter: Payer: Self-pay | Admitting: Physician Assistant

## 2022-09-20 ENCOUNTER — Ambulatory Visit
Admission: EM | Admit: 2022-09-20 | Discharge: 2022-09-20 | Disposition: A | Discharge status: Home / Self Care | Payer: 59 | Attending: Urgent Care | Admitting: Urgent Care

## 2022-09-20 ENCOUNTER — Other Ambulatory Visit (HOSPITAL_COMMUNITY): Payer: Self-pay

## 2022-09-20 DIAGNOSIS — H6982 Other specified disorders of Eustachian tube, left ear: Secondary | ICD-10-CM

## 2022-09-20 DIAGNOSIS — H9202 Otalgia, left ear: Secondary | ICD-10-CM | POA: Diagnosis not present

## 2022-09-20 DIAGNOSIS — H6992 Unspecified Eustachian tube disorder, left ear: Secondary | ICD-10-CM

## 2022-09-20 MED ORDER — FEXOFENADINE HCL 180 MG PO TABS
180.0000 mg | ORAL_TABLET | Freq: Every day | ORAL | 0 refills | Status: DC
  Filled 2022-09-20: qty 90, 90d supply, fill #0

## 2022-09-20 MED ORDER — FLUTICASONE PROPIONATE 50 MCG/ACT NA SUSP
2.0000 | Freq: Every day | NASAL | 12 refills | Status: DC
Start: 2022-09-20 — End: 2023-03-31
  Filled 2022-09-20: qty 16, 30d supply, fill #0

## 2022-09-20 MED ORDER — PSEUDOEPHEDRINE HCL 60 MG PO TABS
60.0000 mg | ORAL_TABLET | Freq: Three times a day (TID) | ORAL | 0 refills | Status: DC | PRN
  Filled 2022-09-20: qty 30, 10d supply, fill #0

## 2022-09-20 NOTE — ED Provider Notes (Signed)
[facility]  Note:  This document was prepared using Systems analyst and may include unintentional dictation errors.  MRN: [id number] DOB: Oct 27, 1976  Subjective:   Marissa Gonzalez is a 46 y.o. female presenting for 1 day history of acute onset persistent left ear pain, radiates down towards the neck.  No fever, runny or stuffy nose, sore throat, ear drainage.  She did have some tinnitus and an episode of vertigo 2 weeks ago.  She has been using Zyrtec daily for her allergies.  No trauma to the ear.  No swimming.  No current facility-administered medications for this encounter.  Current Outpatient Medications:    cetirizine (ZYRTEC) 10 MG tablet, Take 10 mg by mouth daily., Disp: , Rfl:    citalopram (CELEXA) 20 MG tablet, TAKE 1 TABLET BY MOUTH DAILY, Disp: 90 tablet, Rfl: 1   ibuprofen (ADVIL,MOTRIN) 600 MG tablet, Take 1 tablet (600 mg total) by mouth every 6 (six) hours., Disp: 30 tablet, Rfl: 1   LORazepam (ATIVAN) 0.5 MG tablet, TAKE 1 TABLET BY MOUTH DAILY AS NEEDED FOR ANXIETY, Disp: 30 tablet, Rfl: 2   Multiple Vitamins-Minerals (HAIR SKIN AND NAILS FORMULA) TABS, Take 2 each by mouth daily., Disp: , Rfl:    OVER THE COUNTER MEDICATION, Take 1 tablet by mouth daily in the afternoon. MMN Amino Acid, Disp: , Rfl:    Allergies  Allergen Reactions   Augmentin [Amoxicillin-Pot Clavulanate]    Sulfa Antibiotics     Past Medical History:  Diagnosis Date   Abnormal Pap smear    Anxiety    Fibroid 2012   this pregnancy   GERD (gastroesophageal reflux disease)    pregnancy related   Headache(784.0)    Heart murmur    no problems   Hx of migraines    as teenager   Lactose intolerance    Sciatic nerve pain 2012   this pregnancy     Past Surgical History:  Procedure Laterality Date   CESAREAN SECTION  03/10/2012   Procedure: CESAREAN SECTION;  Surgeon: Cyril Mourning, MD;x 1  Location: [facility];  Service: Gynecology;   Laterality: N/A;   CESAREAN SECTION  08/08/2015   CESAREAN SECTION WITH BILATERAL TUBAL LIGATION Bilateral 08/08/2015   Procedure: CESAREAN SECTION WITH Left Partial Salpingectomy, and Right Ligation of Fallopian tube with Filshie Clip.;  Surgeon: Marylynn Pearson, MD;  Location: [facility];  Service: Obstetrics;  Laterality: Bilateral;  edc 08/12/15 [name] RNFA confirmed 7/29...tms    dental implant  1995   NO PAST SURGERIES     TUBAL LIGATION  08/08/2015   WISDOM TOOTH EXTRACTION  1985    Family History  Problem Relation Age of Onset   Cancer Maternal Grandmother    Arthritis Maternal Grandmother    Miscarriages / Stillbirths Maternal Grandmother    Stroke Maternal Grandmother    Depression Mother        per prenatal   Hypertension Mother    Depression Father        per prenatal   Hearing loss Father    Hypertension Father    Hyperlipidemia Father    Depression Brother    Heart attack Maternal Grandfather    Heart disease Maternal Grandfather    Hypertension Maternal Grandfather    Heart attack Paternal Grandfather    Heart disease Paternal Grandfather     Social History   Tobacco Use   Smoking status: Former    Types: Cigarettes  Quit date: 11/13/1999    Years since quitting: 22.8   Smokeless tobacco: Never  Vaping Use   Vaping Use: Never used  Substance Use Topics   Alcohol use: Yes    Comment: 2 drinks per weekend beer and liquor   Drug use: Yes    Types: Marijuana    Comment: couple times a month    ROS   Objective:   Vitals: BP 120/68 (BP Location: Left Arm)   Pulse 68   Temp 98 F (36.7 C) (Oral)   LMP 08/26/2022   SpO2 98%   Physical Exam Constitutional:      General: She is not in acute distress.    Appearance: Normal appearance. She is well-developed. She is not ill-appearing, toxic-appearing or diaphoretic.  HENT:     Head: Normocephalic and atraumatic.     Right Ear: Tympanic membrane, ear canal and external ear normal. No tenderness.  There is no impacted cerumen. Tympanic membrane is not injected, perforated, erythematous or bulging.     Left Ear: Tympanic membrane, ear canal and external ear normal. No tenderness. There is no impacted cerumen. Tympanic membrane is not injected, perforated, erythematous or bulging.     Nose: Nose normal.     Mouth/Throat:     Mouth: Mucous membranes are moist.     Pharynx: No oropharyngeal exudate or posterior oropharyngeal erythema.     Comments: Cobblestone pattern drainage overlying the pharynx. Eyes:     General: No scleral icterus.       Right eye: No discharge.        Left eye: No discharge.     Extraocular Movements: Extraocular movements intact.  Cardiovascular:     Rate and Rhythm: Normal rate.  Pulmonary:     Effort: Pulmonary effort is normal.  Skin:    General: Skin is warm and dry.  Neurological:     General: No focal deficit present.     Mental Status: She is alert and oriented to person, place, and time.  Psychiatric:        Mood and Affect: Mood normal.        Behavior: Behavior normal.     Assessment and Plan :   PDMP not reviewed this encounter.  1. Eustachian tube dysfunction, left   2. Acute otalgia, left     Unremarkable ENT exam.  Will use conservative management for what I suspect is eustachian tube dysfunction.  Recommended starting Flonase, Allegra, Sudafed.  Patient is experiencing significant internal sharp pains intermittently but not constantly.  She is very concerned about infection and therefore I offered to use an oral antibiotic if she develops fever or worsening more constant pain over the weekend.  Thereafter she would need to follow-up in clinic.  Counseled patient on potential for adverse effects with medications prescribed/recommended today, ER and return-to-clinic precautions discussed, patient verbalized understanding.    Jaynee Eagles, [place] 09/20/22 1156

## 2022-09-20 NOTE — ED Triage Notes (Signed)
Pain in left ear and jaw.

## 2022-10-07 DIAGNOSIS — H52203 Unspecified astigmatism, bilateral: Secondary | ICD-10-CM | POA: Diagnosis not present

## 2022-10-07 DIAGNOSIS — H5213 Myopia, bilateral: Secondary | ICD-10-CM | POA: Diagnosis not present

## 2022-10-24 DIAGNOSIS — Z30431 Encounter for routine checking of intrauterine contraceptive device: Secondary | ICD-10-CM | POA: Diagnosis not present

## 2022-10-24 DIAGNOSIS — N62 Hypertrophy of breast: Secondary | ICD-10-CM | POA: Diagnosis not present

## 2022-11-21 ENCOUNTER — Other Ambulatory Visit (HOSPITAL_COMMUNITY): Payer: Self-pay

## 2022-11-26 ENCOUNTER — Other Ambulatory Visit: Payer: Self-pay | Admitting: Physician Assistant

## 2022-11-26 ENCOUNTER — Other Ambulatory Visit (HOSPITAL_COMMUNITY): Payer: Self-pay

## 2022-11-26 MED ORDER — CITALOPRAM HYDROBROMIDE 20 MG PO TABS
20.0000 mg | ORAL_TABLET | Freq: Every day | ORAL | 1 refills | Status: DC
  Filled 2022-11-26: qty 90, fill #0
  Filled 2022-11-27 – 2023-02-05 (×2): qty 90, 90d supply, fill #0
  Filled 2023-05-21: qty 90, 90d supply, fill #1

## 2022-11-27 ENCOUNTER — Other Ambulatory Visit (HOSPITAL_COMMUNITY): Payer: Self-pay

## 2022-12-02 DIAGNOSIS — M549 Dorsalgia, unspecified: Secondary | ICD-10-CM | POA: Diagnosis not present

## 2022-12-02 DIAGNOSIS — G8929 Other chronic pain: Secondary | ICD-10-CM | POA: Diagnosis not present

## 2022-12-02 DIAGNOSIS — M542 Cervicalgia: Secondary | ICD-10-CM | POA: Diagnosis not present

## 2022-12-02 DIAGNOSIS — N62 Hypertrophy of breast: Secondary | ICD-10-CM | POA: Diagnosis not present

## 2022-12-02 DIAGNOSIS — E7800 Pure hypercholesterolemia, unspecified: Secondary | ICD-10-CM | POA: Insufficient documentation

## 2022-12-07 ENCOUNTER — Other Ambulatory Visit (HOSPITAL_COMMUNITY): Payer: Self-pay

## 2022-12-09 ENCOUNTER — Other Ambulatory Visit (HOSPITAL_COMMUNITY): Payer: Self-pay

## 2022-12-09 ENCOUNTER — Ambulatory Visit
Admission: EM | Admit: 2022-12-09 | Discharge: 2022-12-09 | Disposition: A | Discharge status: Home / Self Care | Payer: 59 | Attending: Urgent Care | Admitting: Urgent Care

## 2022-12-09 DIAGNOSIS — J101 Influenza due to other identified influenza virus with other respiratory manifestations: Secondary | ICD-10-CM | POA: Diagnosis not present

## 2022-12-09 DIAGNOSIS — R070 Pain in throat: Secondary | ICD-10-CM

## 2022-12-09 LAB — POCT RAPID STREP A (OFFICE): Rapid Strep A Screen: NEGATIVE

## 2022-12-09 LAB — POCT INFLUENZA A/B
Influenza A, POC: POSITIVE — AB
Influenza B, POC: NEGATIVE

## 2022-12-09 MED ORDER — OSELTAMIVIR PHOSPHATE 75 MG PO CAPS
75.0000 mg | ORAL_CAPSULE | Freq: Two times a day (BID) | ORAL | 0 refills | Status: AC
  Filled 2022-12-09 (×2): qty 10, 5d supply, fill #0

## 2022-12-09 MED ORDER — PROMETHAZINE-DM 6.25-15 MG/5ML PO SYRP
5.0000 mL | ORAL_SOLUTION | Freq: Three times a day (TID) | ORAL | 0 refills | Status: DC | PRN
  Filled 2022-12-09 (×2): qty 200, 14d supply, fill #0

## 2022-12-09 NOTE — ED Triage Notes (Signed)
Onset 2-3 days of cough,congestion and low grade temp.

## 2022-12-09 NOTE — ED Provider Notes (Signed)
[facility]  Note:  This document was prepared using Systems analyst and may include unintentional dictation errors.  MRN: [id number] DOB: 03/09/1976  Subjective:   Marissa Gonzalez is a 46 y.o. female presenting for 2 to 3-day history of acute onset body aches, fever, cough, congestion.  Has had 1 sick contact with her son at home who has very similar symptoms.  No overt chest pain, shortness of breath or wheezing.  No history of asthma.  Patient is not a smoker.  No current facility-administered medications for this encounter.  Current Outpatient Medications:    cetirizine (ZYRTEC) 10 MG tablet, Take 10 mg by mouth daily., Disp: , Rfl:    citalopram (CELEXA) 20 MG tablet, Take 1 tablet (20 mg total) by mouth daily., Disp: 90 tablet, Rfl: 1   fexofenadine (ALLEGRA) 180 MG tablet, Take 1 tablet (180 mg total) by mouth daily., Disp: 90 tablet, Rfl: 0   fluticasone (FLONASE) 50 MCG/ACT nasal spray, Place 2 sprays into both nostrils daily., Disp: 16 g, Rfl: 12   ibuprofen (ADVIL,MOTRIN) 600 MG tablet, Take 1 tablet (600 mg total) by mouth every 6 (six) hours., Disp: 30 tablet, Rfl: 1   Multiple Vitamins-Minerals (HAIR SKIN AND NAILS FORMULA) TABS, Take 2 each by mouth daily., Disp: , Rfl:    OVER THE COUNTER MEDICATION, Take 1 tablet by mouth daily in the afternoon. MMN Amino Acid, Disp: , Rfl:    pseudoephedrine (SUDAFED) 60 MG tablet, Take 1 tablet (60 mg total) by mouth every 8 (eight) hours as needed for congestion., Disp: 30 tablet, Rfl: 0   Allergies  Allergen Reactions   Augmentin [Amoxicillin-Pot Clavulanate]    Fluticasone Other (See Comments)   Sulfa Antibiotics     Past Medical History:  Diagnosis Date   Abnormal Pap smear    Anxiety    Fibroid 2012   this pregnancy   GERD (gastroesophageal reflux disease)    pregnancy related   Headache(784.0)    Heart murmur    no problems   Hx of migraines    as teenager   Lactose  intolerance    Sciatic nerve pain 2012   this pregnancy     Past Surgical History:  Procedure Laterality Date   CESAREAN SECTION  03/10/2012   Procedure: CESAREAN SECTION;  Surgeon: Cyril Mourning, MD;x 1  Location: [facility];  Service: Gynecology;  Laterality: N/A;   CESAREAN SECTION  08/08/2015   CESAREAN SECTION WITH BILATERAL TUBAL LIGATION Bilateral 08/08/2015   Procedure: CESAREAN SECTION WITH Left Partial Salpingectomy, and Right Ligation of Fallopian tube with Filshie Clip.;  Surgeon: Marylynn Pearson, MD;  Location: [facility];  Service: Obstetrics;  Laterality: Bilateral;  edc 08/12/15 [name] RNFA confirmed 7/29...tms    dental implant  1995   NO PAST SURGERIES     TUBAL LIGATION  08/08/2015   WISDOM TOOTH EXTRACTION  1985    Family History  Problem Relation Age of Onset   Cancer Maternal Grandmother    Arthritis Maternal Grandmother    Miscarriages / Stillbirths Maternal Grandmother    Stroke Maternal Grandmother    Depression Mother        per prenatal   Hypertension Mother    Depression Father        per prenatal   Hearing loss Father    Hypertension Father    Hyperlipidemia Father    Depression Brother    Heart attack Maternal Grandfather    Heart  disease Maternal Grandfather    Hypertension Maternal Grandfather    Heart attack Paternal Grandfather    Heart disease Paternal Grandfather     Social History   Tobacco Use   Smoking status: Former    Types: Cigarettes    Quit date: 11/13/1999    Years since quitting: 23.0   Smokeless tobacco: Never  Vaping Use   Vaping Use: Never used  Substance Use Topics   Alcohol use: Yes    Comment: 2 drinks per weekend beer and liquor   Drug use: Yes    Types: Marijuana    Comment: couple times a month    ROS   Objective:   Vitals: BP 112/80 (BP Location: Left Arm)   Pulse 96   Temp 99.2 F (37.3 C)   Resp 16   SpO2 100%   Physical Exam Constitutional:      General: She is not in acute distress.     Appearance: Normal appearance. She is well-developed. She is not ill-appearing, toxic-appearing or diaphoretic.  HENT:     Head: Normocephalic and atraumatic.     Nose: Nose normal.     Mouth/Throat:     Mouth: Mucous membranes are moist.  Eyes:     General: No scleral icterus.       Right eye: No discharge.        Left eye: No discharge.     Extraocular Movements: Extraocular movements intact.  Cardiovascular:     Rate and Rhythm: Normal rate and regular rhythm.     Heart sounds: Normal heart sounds. No murmur heard.    No friction rub. No gallop.  Pulmonary:     Effort: Pulmonary effort is normal. No respiratory distress.     Breath sounds: No stridor. No wheezing, rhonchi or rales.  Chest:     Chest wall: No tenderness.  Skin:    General: Skin is warm and dry.  Neurological:     General: No focal deficit present.     Mental Status: She is alert and oriented to person, place, and time.  Psychiatric:        Mood and Affect: Mood normal.        Behavior: Behavior normal.    Results for orders placed or performed during the hospital encounter of 12/09/22 (from the past 24 hour(s))  POCT rapid strep A     Status: None   Collection Time: 12/09/22  3:03 PM  Result Value Ref Range   Rapid Strep A Screen Negative Negative  POCT Influenza A/B     Status: Abnormal   Collection Time: 12/09/22  3:03 PM  Result Value Ref Range   Influenza A, POC Positive (A) Negative   Influenza B, POC Negative Negative     Assessment and Plan :   PDMP not reviewed this encounter.  1. Influenza A   2. Throat pain     Throat culture pending. Deferred imaging given clear cardiopulmonary exam, hemodynamically stable vital signs. Will cover for influenza with Tamiflu given + results.  Use supportive care, rest, fluids, hydration, light meals, schedule Tylenol and ibuprofen.  Deferred imaging given clear cardiopulmonary exam, hemodynamically stable vital signs.  Counseled patient on potential for  adverse effects with medications prescribed today, patient verbalized understanding. ER and return-to-clinic precautions discussed, patient verbalized understanding.    Jaynee Eagles, [place] 12/10/22 [phone]

## 2022-12-09 NOTE — Discharge Instructions (Addendum)
We will manage this as a viral illness. For sore throat or cough try using a honey-based tea. Use 3 teaspoons of honey with juice squeezed from half lemon. Place shaved pieces of ginger into 1/2-1 cup of water and warm over stove top. Then mix the ingredients and repeat every 4 hours as needed. Please take ibuprofen '600mg'$  every 6 hours with food alternating with OR taken together with Tylenol '500mg'$ -'650mg'$  every 6 hours for throat pain, fevers, aches and pains. Hydrate very well with at least 2 liters of water. Eat light meals such as soups (chicken and noodles, vegetable, chicken and wild rice).  Do not eat foods that you are allergic to.  Taking an antihistamine like Zyrtec can help against postnasal drainage, sinus congestion which can cause sinus pain, sinus headaches, throat pain, painful swallowing, coughing.  You can take this together with pseudoephedrine (Sudafed) at a dose of 30-60 mg 3 times a day or twice daily as needed for the same kind of nasal drip, congestion. Use the cough medications as needed.

## 2022-12-12 LAB — CULTURE, GROUP A STREP (THRC)

## 2023-02-05 ENCOUNTER — Other Ambulatory Visit: Payer: Self-pay

## 2023-02-06 ENCOUNTER — Other Ambulatory Visit (HOSPITAL_COMMUNITY): Payer: Self-pay

## 2023-02-06 ENCOUNTER — Encounter (HOSPITAL_COMMUNITY): Payer: Self-pay | Admitting: Pharmacist

## 2023-02-07 ENCOUNTER — Ambulatory Visit (HOSPITAL_COMMUNITY)
Admission: RE | Admit: 2023-02-07 | Discharge: 2023-02-07 | Disposition: A | Discharge status: Home / Self Care | Payer: 59 | Source: Ambulatory Visit | Attending: Obstetrics and Gynecology | Admitting: Obstetrics and Gynecology

## 2023-02-07 DIAGNOSIS — N6489 Other specified disorders of breast: Secondary | ICD-10-CM | POA: Diagnosis not present

## 2023-02-07 DIAGNOSIS — Z9189 Other specified personal risk factors, not elsewhere classified: Secondary | ICD-10-CM | POA: Diagnosis not present

## 2023-02-07 MED ORDER — GADOBUTROL 1 MMOL/ML IV SOLN
8.0000 mL | Freq: Once | INTRAVENOUS | Status: AC | PRN
  Administered 2023-02-07: 8 mL via INTRAVENOUS

## 2023-03-31 ENCOUNTER — Encounter: Payer: Self-pay | Admitting: Plastic Surgery

## 2023-03-31 ENCOUNTER — Ambulatory Visit (INDEPENDENT_AMBULATORY_CARE_PROVIDER_SITE_OTHER): Payer: 59 | Admitting: Plastic Surgery

## 2023-03-31 VITALS — BP 123/82 | HR 79 | Ht 65.0 in | Wt 195.0 lb

## 2023-03-31 DIAGNOSIS — M25511 Pain in right shoulder: Secondary | ICD-10-CM

## 2023-03-31 DIAGNOSIS — M546 Pain in thoracic spine: Secondary | ICD-10-CM

## 2023-03-31 DIAGNOSIS — N62 Hypertrophy of breast: Secondary | ICD-10-CM

## 2023-03-31 DIAGNOSIS — Z6832 Body mass index (BMI) 32.0-32.9, adult: Secondary | ICD-10-CM

## 2023-03-31 DIAGNOSIS — M25512 Pain in left shoulder: Secondary | ICD-10-CM | POA: Diagnosis not present

## 2023-03-31 DIAGNOSIS — M542 Cervicalgia: Secondary | ICD-10-CM | POA: Diagnosis not present

## 2023-03-31 DIAGNOSIS — G8929 Other chronic pain: Secondary | ICD-10-CM

## 2023-03-31 NOTE — Progress Notes (Signed)
Referring Provider Jarold Motto, PA [address] [place],  [place] [address]   CC:  Chief Complaint  Patient presents with   Consult      UMI FRUGE is an 47 y.o. female.  HPI: Ms. Olinski is a 47 year old female who presents today for consideration of a bilateral breast reduction.  She was seen by a another plastic surgeon who had her scheduled then her insurance plan changed.  She states that she has several years of upper back and neck pain which she feels are due to the large size of her breast.  She states that her bra straps digging into her shoulders causing grooving.  She feels that her breasts are interfering with her ability to do her job and in her ability to run and participate in other physical activities.  She is requesting surgical reduction of the size of her breast.  Allergies  Allergen Reactions   Augmentin [Amoxicillin-Pot Clavulanate]    Fluticasone Other (See Comments)   Sulfa Antibiotics     Outpatient Encounter Medications as of 03/31/2023  Medication Sig   cetirizine (ZYRTEC) 10 MG tablet Take 10 mg by mouth daily.   citalopram (CELEXA) 20 MG tablet Take 1 tablet (20 mg total) by mouth daily.   ibuprofen (ADVIL,MOTRIN) 600 MG tablet Take 1 tablet (600 mg total) by mouth every 6 (six) hours.   Multiple Vitamins-Minerals (HAIR SKIN AND NAILS FORMULA) TABS Take 2 each by mouth daily.   OVER THE COUNTER MEDICATION Take 1 tablet by mouth daily in the afternoon. MMN Amino Acid   promethazine-dextromethorphan (PROMETHAZINE-DM) 6.25-15 MG/5ML syrup Take 5 mLs by mouth 3 (three) times daily as needed for cough.   [DISCONTINUED] fexofenadine (ALLEGRA) 180 MG tablet Take 1 tablet (180 mg total) by mouth daily.   [DISCONTINUED] fluticasone (FLONASE) 50 MCG/ACT nasal spray Place 2 sprays into both nostrils daily.   [DISCONTINUED] pseudoephedrine (SUDAFED) 60 MG tablet Take 1 tablet (60 mg total) by mouth every 8 (eight) hours as needed for congestion.    No facility-administered encounter medications on file as of 03/31/2023.     Past Medical History:  Diagnosis Date   Abnormal Pap smear    Anxiety    Fibroid 2012   this pregnancy   GERD (gastroesophageal reflux disease)    pregnancy related   Headache(784.0)    Heart murmur    no problems   Hx of migraines    as teenager   Lactose intolerance    Sciatic nerve pain 2012   this pregnancy    Past Surgical History:  Procedure Laterality Date   CESAREAN SECTION  03/10/2012   Procedure: CESAREAN SECTION;  Surgeon: Jeani Hawking, MD;x 1  Location: [facility];  Service: Gynecology;  Laterality: N/A;   CESAREAN SECTION  08/08/2015   CESAREAN SECTION WITH BILATERAL TUBAL LIGATION Bilateral 08/08/2015   Procedure: CESAREAN SECTION WITH Left Partial Salpingectomy, and Right Ligation of Fallopian tube with Filshie Clip.;  Surgeon: Zelphia Cairo, MD;  Location: [facility];  Service: Obstetrics;  Laterality: Bilateral;  edc 08/12/15 [name] RNFA confirmed 7/29...tms    dental implant  1995   NO PAST SURGERIES     TUBAL LIGATION  08/08/2015   WISDOM TOOTH EXTRACTION  1985    Family History  Problem Relation Age of Onset   Cancer Maternal Grandmother    Arthritis Maternal Grandmother    Miscarriages / Stillbirths Maternal Grandmother    Stroke Maternal Grandmother    Depression Mother  per prenatal   Hypertension Mother    Depression Father        per prenatal   Hearing loss Father    Hypertension Father    Hyperlipidemia Father    Depression Brother    Heart attack Maternal Grandfather    Heart disease Maternal Grandfather    Hypertension Maternal Grandfather    Heart attack Paternal Grandfather    Heart disease Paternal Grandfather     Social History   Social History Narrative   Surveyor, mining -- therapist with [employer]   47 y/o Leta Jungling   47 y/o [name]     Review of Systems General: Denies fevers, chills, weight loss CV: Denies chest pain, shortness of breath,  palpitations Breast: Large breasts which interfere with her daily activities, family history of breast disease.  Physical Exam    03/31/2023    9:18 AM 12/09/2022    2:33 PM 09/20/2022   11:29 AM  Vitals with BMI  Height 5\' 5"     Weight 195 lbs    BMI 32.45    Systolic 123 112 671  Diastolic 82 80 68  Pulse 79 96 68    General:  No acute distress,  Alert and oriented, Non-Toxic, Normal speech and affect Breast: Patient has very large pendulous breast with grade 3 ptosis.  On physical exam there are no dominant masses and no nipple abnormalities, she also has no evidence of nipple discharge.  The sternal notch to nipple distance on the right is 35 cm and the fold to nipple distance is 18 cm the sternal notch to nipple distance on the left is 35 cm and the fold to nipple distance is 17 cm. 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  She has evidence of grooving in the shoulders from her bra straps.  I believe that I can remove 950 g per breast.  We discussed the breast reduction including the location of the incisions and the unpredictable nature of scarring.  We discussed the risks of bleeding, infection, and seroma formation.  She understands that I will use drains postoperatively to help decrease the risk of seroma formation.  We discussed the possibility of nipple loss due to nipple ischemia.  We discussed the fact that a breast reduction will potentially make her mammograms more difficult to interpret in the future.  We discussed the postoperative restrictions including no heavy lifting greater than 20 pounds, no vigorous activity, and no submerging the incisions in water.  We did discuss the possibility of DVT and the importance of ambulation and fluid hydration postoperatively.  She may return to light activities postoperatively as tolerated.  All questions  were answered to her satisfaction and she request that I submit her for consideration of a breast reduction.  Photographs were obtained today with her consent.  Santiago Glad 03/31/2023, 10:25 AM

## 2023-04-10 NOTE — Therapy (Signed)
OUTPATIENT PHYSICAL THERAPY EVALUATION   Patient Name: Marissa Gonzalez MRN: [id number] DOB:April 07, 1976,47 y.o., female Today's Date: 04/11/2023   END OF SESSION:  PT End of Session - 04/11/23 1011     Visit Number 1    Number of Visits 6    Date for PT Re-Evaluation 05/24/23    Authorization Type [employer]    PT Start Time 0932    PT Stop Time 1011    PT Time Calculation (min) 39 min    Activity Tolerance Patient tolerated treatment well    Behavior During Therapy [facility] for tasks assessed/performed              Past Medical History:  Diagnosis Date   Abnormal Pap smear    Anxiety    Fibroid 2012   this pregnancy   GERD (gastroesophageal reflux disease)    pregnancy related   Headache(784.0)    Heart murmur    no problems   Hx of migraines    as teenager   Lactose intolerance    Sciatic nerve pain 2012   this pregnancy   Past Surgical History:  Procedure Laterality Date   CESAREAN SECTION  03/10/2012   Procedure: CESAREAN SECTION;  Surgeon: Jeani Hawking, MD;x 1  Location: [facility];  Service: Gynecology;  Laterality: N/A;   CESAREAN SECTION  08/08/2015   CESAREAN SECTION WITH BILATERAL TUBAL LIGATION Bilateral 08/08/2015   Procedure: CESAREAN SECTION WITH Left Partial Salpingectomy, and Right Ligation of Fallopian tube with Filshie Clip.;  Surgeon: Zelphia Cairo, MD;  Location: [facility];  Service: Obstetrics;  Laterality: Bilateral;  edc 08/12/15 [name] RNFA confirmed 7/29...tms    dental implant  1995   NO PAST SURGERIES     TUBAL LIGATION  08/08/2015   WISDOM TOOTH EXTRACTION  1985   Patient Active Problem List   Diagnosis Date Noted   Uterine leiomyoma 04/07/2020   Anxiety 04/17/2018   IUFD (intrauterine fetal death) 06/18/14    PCP: Jarold Motto, PA  REFERRING PROVIDER: Santiago Glad, MD  REFERRING DIAG:  N62 (ICD-10-CM) - Macromastia  M54.6,G89.29 (ICD-10-CM) - Chronic bilateral thoracic back pain  M54.2 (ICD-10-CM) - Neck pain   M25.511,G89.29,M25.512 (ICD-10-CM) - Chronic pain of both shoulders    Rationale for Evaluation and Treatment: Rehabilitation  THERAPY DIAG:  Other low back pain  Pain in thoracic spine  Cervicalgia  Muscle weakness (generalized)  Abnormal posture  ONSET DATE: chronic  SUBJECTIVE:  SUBJECTIVE STATEMENT: Patient reports having chronic pain in her shoulders, neck, and back that has been ongoing for at least 12 years. She had a cardiac CT a few months ago and it showed thoracic spondylosis, which really was her push to pursue a breast reduction. She reports her midback has a constant ache and feels that her breast size contributes to this pain. She feels that her low back pain is also due to her breast size as this weight causes her to hunch over, putting more stress on her back. She is interested in undergoing a breast reduction. She does note a history of sciatica that shoots down the LLE that began with her first pregnancy in 2012. She notices this shooting pain if she tries to sit up quickly in the morning and with prolonged sitting. No numbness or tingling. No changes in bowel or bladder.   PERTINENT HISTORY:  History of C-section   PAIN:  Are you having pain? Yes: NPRS scale: 4 (at worst 8)/10 Pain location: shoulders,neck,back Pain description: constant, ache Aggravating factors: heavy lifting, emptying dishwasher, bending Relieving factors: pilates  PRECAUTIONS: None  WEIGHT BEARING RESTRICTIONS: No  FALLS:  Has patient fallen in last 6 months? No  LIVING ENVIRONMENT: Lives with: lives with their family Lives in: House/apartment Stairs: Yes: Internal: 20 steps; can reach both Has following equipment at home: None  OCCUPATION: therapist   PLOF: Independent  PATIENT GOALS:  "reduction in pain."    OBJECTIVE:   DIAGNOSTIC FINDINGS:  None   PATIENT SURVEYS:  N/A  COGNITION: Overall cognitive status: Within functional limits for tasks assessed     SENSATION: Not tested  MUSCLE LENGTH: Not tested   POSTURE: rounded shoulders, forward head, and anterior pelvic tilt  PALPATION: TTP bilateral thoracolumbar paraspinals, pain and hypomobility with PAIVM L-spine and T-spine  CERVICAL ROM:   Active ROM A/PROM (deg) eval  Flexion 30  Extension 46  Right lateral flexion 17  Left lateral flexion 26  Right rotation 66  Left rotation 45 pn   (Blank rows = not tested)  LUMBAR ROM:   AROM eval  Flexion WNL  Extension 25% limited LBP  Right lateral flexion WNL LBP  Left lateral flexion WNL LBP  Right rotation WNL  Left rotation WNL   (Blank rows = not tested)  LOWER EXTREMITY ROM:     Active  Right eval Left eval  Hip flexion    Hip extension    Hip abduction    Hip adduction    Hip internal rotation    Hip external rotation    Knee flexion    Knee extension    Ankle dorsiflexion    Ankle plantarflexion    Ankle inversion    Ankle eversion     (Blank rows = not tested)  LOWER EXTREMITY MMT:    MMT Right eval Left eval  Hip flexion 4 4  Hip extension 4 4  Hip abduction 4 4-  Hip adduction    Hip internal rotation    Hip external rotation    Knee flexion    Knee extension    Ankle dorsiflexion    Ankle plantarflexion    Ankle inversion    Ankle eversion     (Blank rows = not tested) UPPER EXTREMITY MMT:  MMT Right eval Left eval  Shoulder flexion 5 5  Shoulder extension    Shoulder abduction 5 5  Shoulder adduction    Shoulder extension    Shoulder internal rotation  Shoulder external rotation    Middle trapezius 4 4  Lower trapezius    Elbow flexion    Elbow extension    Wrist flexion    Wrist extension    Wrist ulnar deviation    Wrist radial deviation    Wrist pronation    Wrist supination    Grip  strength     (Blank rows = not tested)  SPECIAL TESTS:  SLR (-)  FUNCTIONAL TESTS:  DNF endurance: 30 seconds  Functional lifting: excessive trunk flexion   GAIT: Distance walked: 10 ft  Assistive device utilized: None Level of assistance: Complete Independence Comments: WNL  [facility] Adult PT Treatment:                                                DATE: 04/11/23  Therapeutic Exercise: Demonstrated and issued initial HEP.    Therapeutic Activity: Education on assessment findings that will be addressed throughout duration of POC.       PATIENT EDUCATION:  Education details: see treatment Person educated: Patient Education method: Explanation, Demonstration, Tactile cues, Verbal cues, and Handouts Education comprehension: verbalized understanding, returned demonstration, verbal cues required, tactile cues required, and needs further education  HOME EXERCISE PROGRAM: Access Code: [email or web address] URL: [email or web address] Date: 04/11/2023 Prepared by: Letitia Libra  Exercises - Sidelying Thoracic Rotation with Open Book  - 1 x daily - 7 x weekly - 1 sets - 10 reps - Supine Posterior Pelvic Tilt  - 1 x daily - 7 x weekly - 1 sets - 10 reps - 5 sec  hold - Supine March with Posterior Pelvic Tilt  - 1 x daily - 7 x weekly - 2 sets - 10 reps - Seated Shoulder Horizontal Abduction with Resistance  - 1 x daily - 7 x weekly - 2 sets - 10 reps - Shoulder External Rotation and Scapular Retraction with Resistance  - 1 x daily - 7 x weekly - 2 sets - 10 reps  ASSESSMENT:  CLINICAL IMPRESSION: Patient is a 47 y.o. female who was seen today for physical therapy evaluation and treatment for chronic neck,back, and shoulder pain that has been ongoing for years that she attributes to her breast size. She reports the pain has been ongoing for at least 12 years and is worsened with lifting, bending, and sitting activity. Upon assessment she is noted to have limited and painful Lt  cervical rotation AROM, limited and painful trunk extension AROM, hip,core, and periscapular weakness, and postural abnormalities. She will benefit from skilled PT to address the above stated deficits in order to optimize her function as she prepares for breast reduction surgery  OBJECTIVE IMPAIRMENTS: decreased activity tolerance, decreased knowledge of condition, decreased ROM, decreased strength, hypomobility, impaired flexibility, improper body mechanics, postural dysfunction, and pain.   ACTIVITY LIMITATIONS: carrying, lifting, bending, sitting, squatting, and reach over head  PARTICIPATION LIMITATIONS: meal prep, cleaning, laundry, shopping, and community activity  PERSONAL FACTORS: Age, Fitness, Profession, Time since onset of injury/illness/exacerbation, and 1 comorbidity: see above  are also affecting patient's functional outcome.   REHAB POTENTIAL: Good  CLINICAL DECISION MAKING: Stable/uncomplicated  EVALUATION COMPLEXITY: Low   GOALS: Goals reviewed with patient? Yes  SHORT TERM GOALS: Target date: 05/01/2023   Patient will be independent and compliant with initial HEP.   Baseline: see above Goal status: INITIAL  2.  Patient will  demonstrate at least 60 degrees of Lt cervical rotation AROM to improve ability to complete head turns while driving.  Baseline: see above  Goal status: INITIAL  3.  Patient will demonstrate pain free lumbar AROM to improve ability to complete reaching and bending activity.  Baseline: see above  Goal status: INITIAL   LONG TERM GOALS: Target date: 05/24/23    Patient will be able to lift at least 15 lbs with proper form without back pain.  Baseline: see above Goal status: INITIAL  2.  Patient will demonstrate 5/5 middle trap strength to improve postural stability. Baseline: see above Goal status: INITIAL  3.  Patient will demonstrate 5/5 bilateral hip strength to improve lumbopelvic stability.  Baseline: see above Goal status:  INITIAL  4.  Patient will report pain at worst rated as </=5/10 to reduce her current functional limitations.  Baseline: see above Goal status: INITIAL  5.  Patient will be independent with advanced home program to assist in management of her chronic condition.  Baseline: see above  Goal status: INITIAL   PLAN:  PT FREQUENCY: 1x/week  PT DURATION: 6 weeks  PLANNED INTERVENTIONS: Therapeutic exercises, Therapeutic activity, Neuromuscular re-education, Balance training, Patient/Family education, Self Care, Dry Needling, Electrical stimulation, Cryotherapy, Moist heat, Traction, Manual therapy, and Re-evaluation.  PLAN FOR NEXT SESSION: review and progress HEP prn; postural strengthening, core/hip strengthening, consider LAD LLE.   Letitia Libra, PT, DPT, ATC 04/11/23 10:40 AM

## 2023-04-11 ENCOUNTER — Ambulatory Visit: Payer: 59 | Attending: Plastic Surgery

## 2023-04-11 ENCOUNTER — Other Ambulatory Visit: Payer: Self-pay

## 2023-04-11 DIAGNOSIS — M25512 Pain in left shoulder: Secondary | ICD-10-CM | POA: Diagnosis not present

## 2023-04-11 DIAGNOSIS — R293 Abnormal posture: Secondary | ICD-10-CM | POA: Insufficient documentation

## 2023-04-11 DIAGNOSIS — M6281 Muscle weakness (generalized): Secondary | ICD-10-CM | POA: Diagnosis not present

## 2023-04-11 DIAGNOSIS — M25511 Pain in right shoulder: Secondary | ICD-10-CM | POA: Insufficient documentation

## 2023-04-11 DIAGNOSIS — N62 Hypertrophy of breast: Secondary | ICD-10-CM | POA: Insufficient documentation

## 2023-04-11 DIAGNOSIS — M542 Cervicalgia: Secondary | ICD-10-CM | POA: Diagnosis not present

## 2023-04-11 DIAGNOSIS — G8929 Other chronic pain: Secondary | ICD-10-CM | POA: Insufficient documentation

## 2023-04-11 DIAGNOSIS — M5459 Other low back pain: Secondary | ICD-10-CM | POA: Diagnosis not present

## 2023-04-11 DIAGNOSIS — M546 Pain in thoracic spine: Secondary | ICD-10-CM | POA: Diagnosis not present

## 2023-04-17 ENCOUNTER — Ambulatory Visit: Payer: 59

## 2023-04-17 DIAGNOSIS — M542 Cervicalgia: Secondary | ICD-10-CM | POA: Diagnosis not present

## 2023-04-17 DIAGNOSIS — M5459 Other low back pain: Secondary | ICD-10-CM | POA: Diagnosis not present

## 2023-04-17 DIAGNOSIS — M25512 Pain in left shoulder: Secondary | ICD-10-CM | POA: Diagnosis not present

## 2023-04-17 DIAGNOSIS — M6281 Muscle weakness (generalized): Secondary | ICD-10-CM | POA: Diagnosis not present

## 2023-04-17 DIAGNOSIS — G8929 Other chronic pain: Secondary | ICD-10-CM | POA: Diagnosis not present

## 2023-04-17 DIAGNOSIS — M546 Pain in thoracic spine: Secondary | ICD-10-CM | POA: Diagnosis not present

## 2023-04-17 DIAGNOSIS — R293 Abnormal posture: Secondary | ICD-10-CM | POA: Diagnosis not present

## 2023-04-17 DIAGNOSIS — N62 Hypertrophy of breast: Secondary | ICD-10-CM | POA: Diagnosis not present

## 2023-04-17 DIAGNOSIS — M25511 Pain in right shoulder: Secondary | ICD-10-CM | POA: Diagnosis not present

## 2023-04-17 NOTE — Therapy (Signed)
OUTPATIENT PHYSICAL THERAPY TREATMENT NOTE   Patient Name: Marissa Gonzalez MRN: [id number] DOB:01/05/1976, 47 y.o., female Today's Date: 04/17/2023  PCP: Jarold Motto, PA  REFERRING PROVIDER:  Santiago Glad, MD   END OF SESSION:   PT End of Session - 04/17/23 1148     Visit Number 2    Number of Visits 6    Date for PT Re-Evaluation 05/24/23    Authorization Type [employer]    PT Start Time 1148    PT Stop Time 1230    PT Time Calculation (min) 42 min    Activity Tolerance Patient tolerated treatment well    Behavior During Therapy [facility] for tasks assessed/performed             Past Medical History:  Diagnosis Date   Abnormal Pap smear    Anxiety    Fibroid 2012   this pregnancy   GERD (gastroesophageal reflux disease)    pregnancy related   Headache(784.0)    Heart murmur    no problems   Hx of migraines    as teenager   Lactose intolerance    Sciatic nerve pain 2012   this pregnancy   Past Surgical History:  Procedure Laterality Date   CESAREAN SECTION  03/10/2012   Procedure: CESAREAN SECTION;  Surgeon: Jeani Hawking, MD;x 1  Location: [facility];  Service: Gynecology;  Laterality: N/A;   CESAREAN SECTION  08/08/2015   CESAREAN SECTION WITH BILATERAL TUBAL LIGATION Bilateral 08/08/2015   Procedure: CESAREAN SECTION WITH Left Partial Salpingectomy, and Right Ligation of Fallopian tube with Filshie Clip.;  Surgeon: Zelphia Cairo, MD;  Location: [facility];  Service: Obstetrics;  Laterality: Bilateral;  edc 08/12/15 [name] RNFA confirmed 7/29...tms    dental implant  1995   NO PAST SURGERIES     TUBAL LIGATION  08/08/2015   WISDOM TOOTH EXTRACTION  1985   Patient Active Problem List   Diagnosis Date Noted   Uterine leiomyoma 04/07/2020   Anxiety 04/17/2018   IUFD (intrauterine fetal death) June 29, 2014    REFERRING DIAG:  N62 (ICD-10-CM) - Macromastia  M54.6,G89.29 (ICD-10-CM) - Chronic bilateral thoracic back pain  M54.2 (ICD-10-CM) - Neck pain   M25.511,G89.29,M25.512 (ICD-10-CM) - Chronic pain of both shoulders    THERAPY DIAG:  Other low back pain  Pain in thoracic spine  Cervicalgia  Muscle weakness (generalized)  Abnormal posture  Rationale for Evaluation and Treatment Rehabilitation  PERTINENT HISTORY: history of C-section   PRECAUTIONS: none   SUBJECTIVE:  SUBJECTIVE STATEMENT:  Patient reports having a "stumble" yesterday when getting up from pilates due to feeling light-headed. She didn't fall, but had to jerk herself to keep from falling that caused more pain.    PAIN:  Are you having pain? Yes: NPRS scale: 4/10 Pain location: shoulders,neck,back Pain description: constant, ache Aggravating factors: heavy lifting, emptying dishwasher, bending Relieving factors: pilates   OBJECTIVE: (objective measures completed at initial evaluation unless otherwise dated)  DIAGNOSTIC FINDINGS:  None    PATIENT SURVEYS:  N/A   COGNITION: Overall cognitive status: Within functional limits for tasks assessed                          SENSATION: Not tested   MUSCLE LENGTH: Not tested    POSTURE: rounded shoulders, forward head, and anterior pelvic tilt   PALPATION: TTP bilateral thoracolumbar paraspinals, pain and hypomobility with PAIVM L-spine and T-spine  CERVICAL ROM:    Active ROM A/PROM (deg) eval  Flexion 30  Extension 46  Right lateral flexion 17  Left lateral flexion 26  Right rotation 66  Left rotation 45 pn   (Blank rows = not tested)   LUMBAR ROM:    AROM eval  Flexion WNL  Extension 25% limited LBP  Right lateral flexion WNL LBP  Left lateral flexion WNL LBP  Right rotation WNL  Left rotation WNL   (Blank rows = not tested)   LOWER EXTREMITY ROM:      Active  Right eval Left eval  Hip flexion       Hip extension      Hip abduction      Hip adduction      Hip internal rotation      Hip external rotation      Knee flexion      Knee extension      Ankle dorsiflexion      Ankle plantarflexion      Ankle inversion      Ankle eversion       (Blank rows = not tested)   LOWER EXTREMITY MMT:     MMT Right eval Left eval  Hip flexion 4 4  Hip extension 4 4  Hip abduction 4 4-  Hip adduction      Hip internal rotation      Hip external rotation      Knee flexion      Knee extension      Ankle dorsiflexion      Ankle plantarflexion      Ankle inversion      Ankle eversion       (Blank rows = not tested) UPPER EXTREMITY MMT:   MMT Right eval Left eval  Shoulder flexion 5 5  Shoulder extension      Shoulder abduction 5 5  Shoulder adduction      Shoulder extension      Shoulder internal rotation      Shoulder external rotation      Middle trapezius 4 4  Lower trapezius      Elbow flexion      Elbow extension      Wrist flexion      Wrist extension      Wrist ulnar deviation      Wrist radial deviation      Wrist pronation      Wrist supination      Grip strength       (Blank rows = not tested)  SPECIAL TESTS:  SLR (-)   FUNCTIONAL TESTS:  DNF endurance: 30 seconds  Functional lifting: excessive trunk flexion    GAIT: Distance walked: 10 ft  Assistive device utilized: None Level of assistance: Complete Independence Comments: WNL [facility] Adult PT Treatment:                                                DATE: 04/17/23 Therapeutic Exercise: Sidelying thoracic rotation x 10  Supine TA march x 10  90/90 march 2 x 10  Hip bridge with abduction 2 x 10  SLR with posterior pelvic 2 x 10  Sidelying hip cirlces 2 x10 CW/CCW Seated shoulder horizontal shoulder abduction green band 2 x 10  Bilateral shoulder ER green band 2 x 10  Resisted rows black band 2 x 15 Updated HEP   [facility] Adult PT Treatment:                                                DATE:  04/11/23   Therapeutic Exercise: Demonstrated and issued initial HEP.      Therapeutic Activity: Education on assessment findings that will be addressed throughout duration of POC.            PATIENT EDUCATION:  Education details: see treatment Person educated: Patient Education method: Explanation, Demonstration, Tactile cues, Verbal cues, and Handouts Education comprehension: verbalized understanding, returned demonstration, verbal cues required, tactile cues required, and needs further education   HOME EXERCISE PROGRAM: Access Code: [email or web address] URL: [email or web address] Date: 04/11/2023 Prepared by: Letitia Libra   Exercises - Sidelying Thoracic Rotation with Open Book  - 1 x daily - 7 x weekly - 1 sets - 10 reps - Supine Posterior Pelvic Tilt  - 1 x daily - 7 x weekly - 1 sets - 10 reps - 5 sec  hold - Supine March with Posterior Pelvic Tilt  - 1 x daily - 7 x weekly - 2 sets - 10 reps - Seated Shoulder Horizontal Abduction with Resistance  - 1 x daily - 7 x weekly - 2 sets - 10 reps - Shoulder External Rotation and Scapular Retraction with Resistance  - 1 x daily - 7 x weekly - 2 sets - 10 reps   ASSESSMENT:   CLINICAL IMPRESSION: Patient arrives for first PT session with mild back,neck, and shoulder pain. Reviewed HEP with patient demonstrating independence with these exercises, STG #1 met. Focused on progression of core/hip and periscapular strengthening with patient tolerating well. She demonstrates good core activation with progression of dynamic core stabilization. Occasional cues required to reduce shoulder shrug with rows. HEP updated to include further strengthening. No increase in pain reported throughout session.    OBJECTIVE IMPAIRMENTS: decreased activity tolerance, decreased knowledge of condition, decreased ROM, decreased strength, hypomobility, impaired flexibility, improper body mechanics, postural dysfunction, and pain.    ACTIVITY LIMITATIONS:  carrying, lifting, bending, sitting, squatting, and reach over head   PARTICIPATION LIMITATIONS: meal prep, cleaning, laundry, shopping, and community activity   PERSONAL FACTORS: Age, Fitness, Profession, Time since onset of injury/illness/exacerbation, and 1 comorbidity: see above  are also affecting patient's functional outcome.    REHAB POTENTIAL: Good   CLINICAL DECISION MAKING: Stable/uncomplicated   EVALUATION COMPLEXITY: Low  GOALS: Goals reviewed with patient? Yes   SHORT TERM GOALS: Target date: 05/01/2023     Patient will be independent and compliant with initial HEP.    Baseline: see above Goal status: MET   2.  Patient will demonstrate at least 60 degrees of Lt cervical rotation AROM to improve ability to complete head turns while driving.  Baseline: see above  Goal status: INITIAL   3.  Patient will demonstrate pain free lumbar AROM to improve ability to complete reaching and bending activity.  Baseline: see above  Goal status: INITIAL     LONG TERM GOALS: Target date: 05/24/23       Patient will be able to lift at least 15 lbs with proper form without back pain.  Baseline: see above Goal status: INITIAL   2.  Patient will demonstrate 5/5 middle trap strength to improve postural stability. Baseline: see above Goal status: INITIAL   3.  Patient will demonstrate 5/5 bilateral hip strength to improve lumbopelvic stability.  Baseline: see above Goal status: INITIAL   4.  Patient will report pain at worst rated as </=5/10 to reduce her current functional limitations.  Baseline: see above Goal status: INITIAL   5.  Patient will be independent with advanced home program to assist in management of her chronic condition.  Baseline: see above  Goal status: INITIAL     PLAN:   PT FREQUENCY: 1x/week   PT DURATION: 6 weeks   PLANNED INTERVENTIONS: Therapeutic exercises, Therapeutic activity, Neuromuscular re-education, Balance training, Patient/Family  education, Self Care, Dry Needling, Electrical stimulation, Cryotherapy, Moist heat, Traction, Manual therapy, and Re-evaluation.   PLAN FOR NEXT SESSION: review and progress HEP prn; postural strengthening, core/hip strengthening  Letitia Libra, PT, DPT, ATC 04/17/23 12:31 PM

## 2023-04-25 ENCOUNTER — Encounter: Payer: Self-pay | Admitting: Physical Therapy

## 2023-04-25 ENCOUNTER — Ambulatory Visit: Payer: 59 | Attending: Plastic Surgery | Admitting: Physical Therapy

## 2023-04-25 DIAGNOSIS — M5459 Other low back pain: Secondary | ICD-10-CM | POA: Insufficient documentation

## 2023-04-25 DIAGNOSIS — M546 Pain in thoracic spine: Secondary | ICD-10-CM | POA: Insufficient documentation

## 2023-04-25 DIAGNOSIS — M6281 Muscle weakness (generalized): Secondary | ICD-10-CM | POA: Diagnosis not present

## 2023-04-25 DIAGNOSIS — M542 Cervicalgia: Secondary | ICD-10-CM | POA: Diagnosis not present

## 2023-04-25 DIAGNOSIS — R293 Abnormal posture: Secondary | ICD-10-CM | POA: Insufficient documentation

## 2023-04-25 NOTE — Therapy (Signed)
OUTPATIENT PHYSICAL THERAPY TREATMENT NOTE   Patient Name: Marissa Gonzalez MRN: [id number] DOB:1976/01/18, 47 y.o., female Today's Date: 04/25/2023  PCP: Jarold Motto, PA  REFERRING PROVIDER:  Santiago Glad, MD   END OF SESSION:   PT End of Session - 04/25/23 1317     Visit Number 3    Number of Visits 6    Date for PT Re-Evaluation 05/24/23    Authorization Type [employer]et[employer]    PT Start Time 1315    PT Stop Time 1355    PT Time Calculation (min) 40 min             Past Medical History:  Diagnosis Date   Abnormal Pap smear    Anxiety    Fibroid 2012   this pregnancy   GERD (gastroesophageal reflux disease)    pregnancy related   Headache(784.0)    Heart murmur    no problems   Hx of migraines    as teenager   Lactose intolerance    Sciatic nerve pain 2012   this pregnancy   Past Surgical History:  Procedure Laterality Date   CESAREAN SECTION  03/10/2012   Procedure: CESAREAN SECTION;  Surgeon: Jeani Hawking, MD;x 1  Location: [facility];  Service: Gynecology;  Laterality: N/A;   CESAREAN SECTION  08/08/2015   CESAREAN SECTION WITH BILATERAL TUBAL LIGATION Bilateral 08/08/2015   Procedure: CESAREAN SECTION WITH Left Partial Salpingectomy, and Right Ligation of Fallopian tube with Filshie Clip.;  Surgeon: Zelphia Cairo, MD;  Location: [facility];  Service: Obstetrics;  Laterality: Bilateral;  edc 08/12/15 [name] RNFA confirmed 7/29...tms    dental implant  1995   NO PAST SURGERIES     TUBAL LIGATION  08/08/2015   WISDOM TOOTH EXTRACTION  1985   Patient Active Problem List   Diagnosis Date Noted   Uterine leiomyoma 04/07/2020   Anxiety 04/17/2018   IUFD (intrauterine fetal death) 2014-06-20    REFERRING DIAG:  N62 (ICD-10-CM) - Macromastia  M54.6,G89.29 (ICD-10-CM) - Chronic bilateral thoracic back pain  M54.2 (ICD-10-CM) - Neck pain  M25.511,G89.29,M25.512 (ICD-10-CM) - Chronic pain of both shoulders    THERAPY DIAG:  Other low back  pain  Pain in thoracic spine  Cervicalgia  Rationale for Evaluation and Treatment Rehabilitation  PERTINENT HISTORY: history of C-section   PRECAUTIONS: none   SUBJECTIVE:                                                                                                                                                                                      SUBJECTIVE STATEMENT:  I woke up with a stiff neck this morning. Mostly on left  side.     PAIN:  Are you having pain? Yes: NPRS scale: 5.5/10 Pain location: shoulders,neck,back Pain description: constant, ache Aggravating factors: heavy lifting, emptying dishwasher, bending Relieving factors: pilates   OBJECTIVE: (objective measures completed at initial evaluation unless otherwise dated)  DIAGNOSTIC FINDINGS:  None    PATIENT SURVEYS:  N/A   COGNITION: Overall cognitive status: Within functional limits for tasks assessed                          SENSATION: Not tested   MUSCLE LENGTH: Not tested    POSTURE: rounded shoulders, forward head, and anterior pelvic tilt   PALPATION: TTP bilateral thoracolumbar paraspinals, pain and hypomobility with PAIVM L-spine and T-spine  CERVICAL ROM:    Active ROM A/PROM (deg) eval AROM 04/25/23  Flexion 30   Extension 46   Right lateral flexion 17 30  Left lateral flexion 26 30  Right rotation 66 60  Left rotation 45 pn 55   (Blank rows = not tested)   LUMBAR ROM:    AROM eval  Flexion WNL  Extension 25% limited LBP  Right lateral flexion WNL LBP  Left lateral flexion WNL LBP  Right rotation WNL  Left rotation WNL   (Blank rows = not tested)   LOWER EXTREMITY ROM:      Active  Right eval Left eval  Hip flexion      Hip extension      Hip abduction      Hip adduction      Hip internal rotation      Hip external rotation      Knee flexion      Knee extension      Ankle dorsiflexion      Ankle plantarflexion      Ankle inversion      Ankle eversion        (Blank rows = not tested)   LOWER EXTREMITY MMT:     MMT Right eval Left eval  Hip flexion 4 4  Hip extension 4 4  Hip abduction 4 4-  Hip adduction      Hip internal rotation      Hip external rotation      Knee flexion      Knee extension      Ankle dorsiflexion      Ankle plantarflexion      Ankle inversion      Ankle eversion       (Blank rows = not tested) UPPER EXTREMITY MMT:   MMT Right eval Left eval  Shoulder flexion 5 5  Shoulder extension      Shoulder abduction 5 5  Shoulder adduction      Shoulder extension      Shoulder internal rotation      Shoulder external rotation      Middle trapezius 4 4  Lower trapezius      Elbow flexion      Elbow extension      Wrist flexion      Wrist extension      Wrist ulnar deviation      Wrist radial deviation      Wrist pronation      Wrist supination      Grip strength       (Blank rows = not tested)   SPECIAL TESTS:  SLR (-)   FUNCTIONAL TESTS:  DNF endurance: 30 seconds  Functional lifting: excessive trunk flexion  GAIT: Distance walked: 10 ft  Assistive device utilized: None Level of assistance: Complete Independence Comments: WNL [facility] Adult PT Treatment:                                                DATE: 04/25/23 Therapeutic Exercise: UBE Level 1 x 2 minutes each way  Pec stretch in door way 15 sec x 2  - Sidelying Thoracic Rotation with Open Book  - 1 x daily - 7 x weekly - 1 sets - 10 reps - Supine Posterior Pelvic Tilt  - 1 x daily - 7 x weekly - 1 sets - 10 reps - 5 sec  hold - Supine Pelvic Tilt with Straight Leg Raise  - 1 x daily - 7 x weekly - 2 sets - 10 reps - Seated Shoulder Horizontal Abduction with Resistance  - 1 x daily - 7 x weekly - 2 sets - 10 reps - Shoulder External Rotation and Scapular Retraction with Resistance  - 1 x daily - 7 x weekly - 2 sets - 10 reps - Bridge with Hip Abduction and Resistance  - 1 x daily - 7 x weekly - 2 sets - 10 reps - Supine 90/90 Alternating Heel  Touches with Posterior Pelvic Tilt  - 1 x daily - 7 x weekly - 2 sets - 10 reps - Standing Shoulder Row with Anchored Resistance  - 1 x daily - 7 x weekly - 2 sets - 10 reps - Seated Upper Trapezius Stretch  - 1 x daily - 7 x weekly - 1 sets - 3 reps - 15-20 hold - Seated Levator Scapulae Stretch  - 1 x daily - 7 x weekly - 1 sets - 3 reps - 15-20 hold    [facility] Adult PT Treatment:                                                DATE: 04/17/23 Therapeutic Exercise: Sidelying thoracic rotation x 10  Supine TA march x 10  90/90 march 2 x 10  Hip bridge with abduction 2 x 10  SLR with posterior pelvic 2 x 10  Sidelying hip cirlces 2 x10 CW/CCW Seated shoulder horizontal shoulder abduction green band 2 x 10  Bilateral shoulder ER green band 2 x 10  Resisted rows black band 2 x 15 Updated HEP   [facility] Adult PT Treatment:                                                DATE: 04/11/23   Therapeutic Exercise: Demonstrated and issued initial HEP.      Therapeutic Activity: Education on assessment findings that will be addressed throughout duration of POC.            PATIENT EDUCATION:  Education details: see treatment Person educated: Patient Education method: Explanation, Demonstration, Tactile cues, Verbal cues, and Handouts Education comprehension: verbalized understanding, returned demonstration, verbal cues required, tactile cues required, and needs further education   HOME EXERCISE PROGRAM: Access Code: [email or web address] URL: [email or web address] Date: 04/11/2023 Prepared by: Letitia Libra   Exercises -  Sidelying Thoracic Rotation with Open Book  - 1 x daily - 7 x weekly - 1 sets - 10 reps - Supine Posterior Pelvic Tilt  - 1 x daily - 7 x weekly - 1 sets - 10 reps - 5 sec  hold - Supine March with Posterior Pelvic Tilt  - 1 x daily - 7 x weekly - 2 sets - 10 reps - Seated Shoulder Horizontal Abduction with Resistance  - 1 x daily - 7 x weekly - 2 sets - 10 reps - Shoulder  External Rotation and Scapular Retraction with Resistance  - 1 x daily - 7 x weekly - 2 sets - 10 reps 04/17/23 - Bridge with Hip Abduction and Resistance  - 1 x daily - 7 x weekly - 2 sets - 10 reps - Supine 90/90 Alternating Heel Touches with Posterior Pelvic Tilt  - 1 x daily - 7 x weekly - 2 sets - 10 reps - Supine Pelvic Tilt with Straight Leg Raise  - 1 x daily - 7 x weekly - 2 sets - 10 reps - Standing Shoulder Row with Anchored Resistance  - 1 x daily - 7 x weekly - 2 sets - 10 reps  04/25/23 - Seated Upper Trapezius Stretch  - 1 x daily - 7 x weekly - 1 sets - 3 reps - 15-20 hold - Seated Levator Scapulae Stretch  - 1 x daily - 7 x weekly - 1 sets - 3 reps - 15-20 hold  ASSESSMENT:   CLINICAL IMPRESSION: Patient arrives for second PT session with moderate back,neck, and shoulder pain. She reports stiff neck this morning. Reviewed entire HEP and progressed with neck stretches. Updated HEP. No increase in pain reported throughout session.    OBJECTIVE IMPAIRMENTS: decreased activity tolerance, decreased knowledge of condition, decreased ROM, decreased strength, hypomobility, impaired flexibility, improper body mechanics, postural dysfunction, and pain.    ACTIVITY LIMITATIONS: carrying, lifting, bending, sitting, squatting, and reach over head   PARTICIPATION LIMITATIONS: meal prep, cleaning, laundry, shopping, and community activity   PERSONAL FACTORS: Age, Fitness, Profession, Time since onset of injury/illness/exacerbation, and 1 comorbidity: see above  are also affecting patient's functional outcome.    REHAB POTENTIAL: Good   CLINICAL DECISION MAKING: Stable/uncomplicated   EVALUATION COMPLEXITY: Low     GOALS: Goals reviewed with patient? Yes   SHORT TERM GOALS: Target date: 05/01/2023     Patient will be independent and compliant with initial HEP.    Baseline: see above Goal status: MET   2.  Patient will demonstrate at least 60 degrees of Lt cervical rotation AROM  to improve ability to complete head turns while driving.  Baseline: see above  Goal status: INITIAL   3.  Patient will demonstrate pain free lumbar AROM to improve ability to complete reaching and bending activity.  Baseline: see above  Goal status: INITIAL     LONG TERM GOALS: Target date: 05/24/23       Patient will be able to lift at least 15 lbs with proper form without back pain.  Baseline: see above Goal status: INITIAL   2.  Patient will demonstrate 5/5 middle trap strength to improve postural stability. Baseline: see above Goal status: INITIAL   3.  Patient will demonstrate 5/5 bilateral hip strength to improve lumbopelvic stability.  Baseline: see above Goal status: INITIAL   4.  Patient will report pain at worst rated as </=5/10 to reduce her current functional limitations.  Baseline: see above Goal status: INITIAL  5.  Patient will be independent with advanced home program to assist in management of her chronic condition.  Baseline: see above  Goal status: INITIAL     PLAN:   PT FREQUENCY: 1x/week   PT DURATION: 6 weeks   PLANNED INTERVENTIONS: Therapeutic exercises, Therapeutic activity, Neuromuscular re-education, Balance training, Patient/Family education, Self Care, Dry Needling, Electrical stimulation, Cryotherapy, Moist heat, Traction, Manual therapy, and Re-evaluation.   PLAN FOR NEXT SESSION: review and progress HEP prn; postural strengthening, core/hip strengthening  Asanti Coward, PTA 04/25/23 2:03 PM Phone: [phone] Fax: [phone]

## 2023-04-29 ENCOUNTER — Telehealth: Payer: Self-pay | Admitting: Plastic Surgery

## 2023-04-29 NOTE — Telephone Encounter (Signed)
PT has completed 3 PT sessions as of 04-29-23

## 2023-05-02 ENCOUNTER — Ambulatory Visit: Payer: 59 | Admitting: Physical Therapy

## 2023-05-02 DIAGNOSIS — M5459 Other low back pain: Secondary | ICD-10-CM

## 2023-05-02 DIAGNOSIS — M542 Cervicalgia: Secondary | ICD-10-CM | POA: Diagnosis not present

## 2023-05-02 DIAGNOSIS — M6281 Muscle weakness (generalized): Secondary | ICD-10-CM

## 2023-05-02 DIAGNOSIS — M546 Pain in thoracic spine: Secondary | ICD-10-CM | POA: Diagnosis not present

## 2023-05-02 DIAGNOSIS — R293 Abnormal posture: Secondary | ICD-10-CM | POA: Diagnosis not present

## 2023-05-02 NOTE — Therapy (Signed)
OUTPATIENT PHYSICAL THERAPY TREATMENT NOTE   Patient Name: Marissa Gonzalez MRN: [id number] DOB:1976-04-27, 47 y.o., female Today's Date: 05/02/2023  PCP: Jarold Motto, PA  REFERRING PROVIDER:  Santiago Glad, MD   END OF SESSION:   PT End of Session - 05/02/23 1019     Visit Number 4    Number of Visits 6    Date for PT Re-Evaluation 05/24/23    Authorization Type [employer]    PT Start Time 1018    PT Stop Time 1100    PT Time Calculation (min) 42 min             Past Medical History:  Diagnosis Date   Abnormal Pap smear    Anxiety    Fibroid 2012   this pregnancy   GERD (gastroesophageal reflux disease)    pregnancy related   Headache(784.0)    Heart murmur    no problems   Hx of migraines    as teenager   Lactose intolerance    Sciatic nerve pain 2012   this pregnancy   Past Surgical History:  Procedure Laterality Date   CESAREAN SECTION  03/10/2012   Procedure: CESAREAN SECTION;  Surgeon: Jeani Hawking, MD;x 1  Location: [facility];  Service: Gynecology;  Laterality: N/A;   CESAREAN SECTION  08/08/2015   CESAREAN SECTION WITH BILATERAL TUBAL LIGATION Bilateral 08/08/2015   Procedure: CESAREAN SECTION WITH Left Partial Salpingectomy, and Right Ligation of Fallopian tube with Filshie Clip.;  Surgeon: Zelphia Cairo, MD;  Location: [facility];  Service: Obstetrics;  Laterality: Bilateral;  edc 08/12/15 [name] RNFA confirmed 7/29...tms    dental implant  1995   NO PAST SURGERIES     TUBAL LIGATION  08/08/2015   WISDOM TOOTH EXTRACTION  1985   Patient Active Problem List   Diagnosis Date Noted   Uterine leiomyoma 04/07/2020   Anxiety 04/17/2018   IUFD (intrauterine fetal death) 05/30/2014    REFERRING DIAG:  N62 (ICD-10-CM) - Macromastia  M54.6,G89.29 (ICD-10-CM) - Chronic bilateral thoracic back pain  M54.2 (ICD-10-CM) - Neck pain  M25.511,G89.29,M25.512 (ICD-10-CM) - Chronic pain of both shoulders    THERAPY DIAG:  Other low back  pain  Pain in thoracic spine  Cervicalgia  Muscle weakness (generalized)  Rationale for Evaluation and Treatment Rehabilitation  PERTINENT HISTORY: history of C-section   PRECAUTIONS: none   SUBJECTIVE:                                                                                                                                                                                      SUBJECTIVE STATEMENT:  It still hurts but not out of control.  I walked yesterday and my shoulders and lower back started hurting. The pain has not been above a 5/10. I went to pilates twice this week.     PAIN:  Are you having pain? Yes: NPRS scale: 4/10 Pain location: left shoulder more today Pain description: constant, ache Aggravating factors: heavy lifting, emptying dishwasher, bending Relieving factors: pilates   OBJECTIVE: (objective measures completed at initial evaluation unless otherwise dated)  DIAGNOSTIC FINDINGS:  None    PATIENT SURVEYS:  N/A   COGNITION: Overall cognitive status: Within functional limits for tasks assessed                          SENSATION: Not tested   MUSCLE LENGTH: Not tested    POSTURE: rounded shoulders, forward head, and anterior pelvic tilt   PALPATION: TTP bilateral thoracolumbar paraspinals, pain and hypomobility with PAIVM L-spine and T-spine  CERVICAL ROM:    Active ROM A/PROM (deg) eval AROM 04/25/23 AROM 05/02/23  Flexion 30    Extension 46    Right lateral flexion 17 30   Left lateral flexion 26 30   Right rotation 66 60 65  Left rotation 45 pn 55 60   (Blank rows = not tested)   LUMBAR ROM:    AROM eval  Flexion WNL  Extension 25% limited LBP  Right lateral flexion WNL LBP  Left lateral flexion WNL LBP  Right rotation WNL  Left rotation WNL   (Blank rows = not tested)   LOWER EXTREMITY ROM:      Active  Right eval Left eval  Hip flexion      Hip extension      Hip abduction      Hip adduction      Hip internal  rotation      Hip external rotation      Knee flexion      Knee extension      Ankle dorsiflexion      Ankle plantarflexion      Ankle inversion      Ankle eversion       (Blank rows = not tested)   LOWER EXTREMITY MMT:     MMT Right eval Left eval  Hip flexion 4 4  Hip extension 4 4  Hip abduction 4 4-  Hip adduction      Hip internal rotation      Hip external rotation      Knee flexion      Knee extension      Ankle dorsiflexion      Ankle plantarflexion      Ankle inversion      Ankle eversion       (Blank rows = not tested) UPPER EXTREMITY MMT:   MMT Right eval Left eval  Shoulder flexion 5 5  Shoulder extension      Shoulder abduction 5 5  Shoulder adduction      Shoulder extension      Shoulder internal rotation      Shoulder external rotation      Middle trapezius 4 4  Lower trapezius      Elbow flexion      Elbow extension      Wrist flexion      Wrist extension      Wrist ulnar deviation      Wrist radial deviation      Wrist pronation      Wrist supination      Grip  strength       (Blank rows = not tested)   SPECIAL TESTS:  SLR (-)   FUNCTIONAL TESTS:  DNF endurance: 30 seconds  Functional lifting: excessive trunk flexion    GAIT: Distance walked: 10 ft  Assistive device utilized: None Level of assistance: Complete Independence Comments: WNL [facility] Adult PT Treatment:                                                DATE: 05/02/23 Therapeutic Exercise: UBE Level 2 x 2 min each way Green band rows  Green band shoulder ext  Pec stretch in doorway  Standing open books Prone scap squeeze , W x 10, horiz x 10 Supine Bridge with green band horizontal abduction Dead Bug from 90/90-  S/L hip abduction circles to fatigue bilateral Upper trap and levator stretch     [facility] Adult PT Treatment:                                                DATE: 04/25/23 Therapeutic Exercise: UBE Level 1 x 2 minutes each way  Pec stretch in door way 15 sec x  2  - Sidelying Thoracic Rotation with Open Book  - 1 x daily - 7 x weekly - 1 sets - 10 reps - Supine Posterior Pelvic Tilt  - 1 x daily - 7 x weekly - 1 sets - 10 reps - 5 sec  hold - Supine Pelvic Tilt with Straight Leg Raise  - 1 x daily - 7 x weekly - 2 sets - 10 reps - Seated Shoulder Horizontal Abduction with Resistance  - 1 x daily - 7 x weekly - 2 sets - 10 reps - Shoulder External Rotation and Scapular Retraction with Resistance  - 1 x daily - 7 x weekly - 2 sets - 10 reps - Bridge with Hip Abduction and Resistance  - 1 x daily - 7 x weekly - 2 sets - 10 reps - Supine 90/90 Alternating Heel Touches with Posterior Pelvic Tilt  - 1 x daily - 7 x weekly - 2 sets - 10 reps - Standing Shoulder Row with Anchored Resistance  - 1 x daily - 7 x weekly - 2 sets - 10 reps - Seated Upper Trapezius Stretch  - 1 x daily - 7 x weekly - 1 sets - 3 reps - 15-20 hold - Seated Levator Scapulae Stretch  - 1 x daily - 7 x weekly - 1 sets - 3 reps - 15-20 hold    [facility] Adult PT Treatment:                                                DATE: 04/17/23 Therapeutic Exercise: Sidelying thoracic rotation x 10  Supine TA march x 10  90/90 march 2 x 10  Hip bridge with abduction 2 x 10  SLR with posterior pelvic 2 x 10  Sidelying hip cirlces 2 x10 CW/CCW Seated shoulder horizontal shoulder abduction green band 2 x 10  Bilateral shoulder ER green band 2 x 10  Resisted rows black band  2 x 15 Updated HEP   [facility] Adult PT Treatment:                                                DATE: 04/11/23   Therapeutic Exercise: Demonstrated and issued initial HEP.      Therapeutic Activity: Education on assessment findings that will be addressed throughout duration of POC.            PATIENT EDUCATION:  Education details: see treatment Person educated: Patient Education method: Explanation, Demonstration, Tactile cues, Verbal cues, and Handouts Education comprehension: verbalized understanding, returned  demonstration, verbal cues required, tactile cues required, and needs further education   HOME EXERCISE PROGRAM: Access Code: [email or web address] URL: [email or web address] Date: 04/11/2023 Prepared by: Letitia Libra   Exercises - Sidelying Thoracic Rotation with Open Book  - 1 x daily - 7 x weekly - 1 sets - 10 reps - Supine Posterior Pelvic Tilt  - 1 x daily - 7 x weekly - 1 sets - 10 reps - 5 sec  hold - Supine March with Posterior Pelvic Tilt  - 1 x daily - 7 x weekly - 2 sets - 10 reps - Seated Shoulder Horizontal Abduction with Resistance  - 1 x daily - 7 x weekly - 2 sets - 10 reps - Shoulder External Rotation and Scapular Retraction with Resistance  - 1 x daily - 7 x weekly - 2 sets - 10 reps 04/17/23 - Bridge with Hip Abduction and Resistance  - 1 x daily - 7 x weekly - 2 sets - 10 reps - Supine 90/90 Alternating Heel Touches with Posterior Pelvic Tilt  - 1 x daily - 7 x weekly - 2 sets - 10 reps - Supine Pelvic Tilt with Straight Leg Raise  - 1 x daily - 7 x weekly - 2 sets - 10 reps - Standing Shoulder Row with Anchored Resistance  - 1 x daily - 7 x weekly - 2 sets - 10 reps  04/25/23 - Seated Upper Trapezius Stretch  - 1 x daily - 7 x weekly - 1 sets - 3 reps - 15-20 hold - Seated Levator Scapulae Stretch  - 1 x daily - 7 x weekly - 1 sets - 3 reps - 15-20 hold 05/02/23 - Sidelying Hip Circles  - 1 x daily - 7 x weekly - 2-3 sets - 10 reps  ASSESSMENT:   CLINICAL IMPRESSION: Patient arrives for third PT session with moderate back,neck, and shoulder pain. She reports pain levels are staying around 5/10 or less. Her cervical AROM has improved. Progressed core, hip,and upper back strength today with good tolerance. Updated HEP with lateral hip strength. She has met. STG# 2.  Will continue to progress as tolerated.     OBJECTIVE IMPAIRMENTS: decreased activity tolerance, decreased knowledge of condition, decreased ROM, decreased strength, hypomobility, impaired flexibility,  improper body mechanics, postural dysfunction, and pain.    ACTIVITY LIMITATIONS: carrying, lifting, bending, sitting, squatting, and reach over head   PARTICIPATION LIMITATIONS: meal prep, cleaning, laundry, shopping, and community activity   PERSONAL FACTORS: Age, Fitness, Profession, Time since onset of injury/illness/exacerbation, and 1 comorbidity: see above  are also affecting patient's functional outcome.    REHAB POTENTIAL: Good   CLINICAL DECISION MAKING: Stable/uncomplicated   EVALUATION COMPLEXITY: Low     GOALS: Goals reviewed with patient? Yes   SHORT  TERM GOALS: Target date: 05/01/2023     Patient will be independent and compliant with initial HEP.  Baseline: see above 05/02/23: compliant most days Goal status: MET   2.  Patient will demonstrate at least 60 degrees of Lt cervical rotation AROM to improve ability to complete head turns while driving.  Baseline: see above  05/02/23: see chart  Goal status: MET    3.  Patient will demonstrate pain free lumbar AROM to improve ability to complete reaching and bending activity.  Baseline: see above  Goal status: INITIAL     LONG TERM GOALS: Target date: 05/24/23       Patient will be able to lift at least 15 lbs with proper form without back pain.  Baseline: see above Goal status: INITIAL   2.  Patient will demonstrate 5/5 middle trap strength to improve postural stability. Baseline: see above Goal status: INITIAL   3.  Patient will demonstrate 5/5 bilateral hip strength to improve lumbopelvic stability.  Baseline: see above Goal status: INITIAL   4.  Patient will report pain at worst rated as </=5/10 to reduce her current functional limitations.  Baseline: see above Goal status: INITIAL   5.  Patient will be independent with advanced home program to assist in management of her chronic condition.  Baseline: see above  Goal status: INITIAL     PLAN:   PT FREQUENCY: 1x/week   PT DURATION: 6 weeks    PLANNED INTERVENTIONS: Therapeutic exercises, Therapeutic activity, Neuromuscular re-education, Balance training, Patient/Family education, Self Care, Dry Needling, Electrical stimulation, Cryotherapy, Moist heat, Traction, Manual therapy, and Re-evaluation.   PLAN FOR NEXT SESSION: review and progress HEP prn; postural strengthening, core/hip strengthening, start lifting   Alicianna Connelley, PTA 05/02/23 11:13 AM Phone: [phone] Fax: [phone]

## 2023-05-06 ENCOUNTER — Ambulatory Visit: Payer: 59

## 2023-05-06 DIAGNOSIS — M5459 Other low back pain: Secondary | ICD-10-CM | POA: Diagnosis not present

## 2023-05-06 DIAGNOSIS — R293 Abnormal posture: Secondary | ICD-10-CM

## 2023-05-06 DIAGNOSIS — M542 Cervicalgia: Secondary | ICD-10-CM | POA: Diagnosis not present

## 2023-05-06 DIAGNOSIS — M546 Pain in thoracic spine: Secondary | ICD-10-CM | POA: Diagnosis not present

## 2023-05-06 DIAGNOSIS — M6281 Muscle weakness (generalized): Secondary | ICD-10-CM | POA: Diagnosis not present

## 2023-05-06 NOTE — Therapy (Signed)
OUTPATIENT PHYSICAL THERAPY TREATMENT NOTE   Patient Name: Marissa Gonzalez MRN: [id number] DOB:12/18/1976, 47 y.o., female Today's Date: 05/06/2023  PCP: Jarold Motto, PA  REFERRING PROVIDER:  Santiago Glad, MD   END OF SESSION:   PT End of Session - 05/06/23 1702     Visit Number 5    Number of Visits 6    Date for PT Re-Evaluation 05/24/23    Authorization Type Aet[employer]    PT Start Time 1701    PT Stop Time 1741    PT Time Calculation (min) 40 min    Activity Tolerance Patient tolerated treatment well    Behavior During Therapy [facility] for tasks assessed/performed              Past Medical History:  Diagnosis Date   Abnormal Pap smear    Anxiety    Fibroid 2012   this pregnancy   GERD (gastroesophageal reflux disease)    pregnancy related   Headache(784.0)    Heart murmur    no problems   Hx of migraines    as teenager   Lactose intolerance    Sciatic nerve pain 2012   this pregnancy   Past Surgical History:  Procedure Laterality Date   CESAREAN SECTION  03/10/2012   Procedure: CESAREAN SECTION;  Surgeon: Jeani Hawking, MD;x 1  Location: [facility];  Service: Gynecology;  Laterality: N/A;   CESAREAN SECTION  08/08/2015   CESAREAN SECTION WITH BILATERAL TUBAL LIGATION Bilateral 08/08/2015   Procedure: CESAREAN SECTION WITH Left Partial Salpingectomy, and Right Ligation of Fallopian tube with Filshie Clip.;  Surgeon: Zelphia Cairo, MD;  Location: [facility];  Service: Obstetrics;  Laterality: Bilateral;  edc 08/12/15 [name] RNFA confirmed 7/29...tms    dental implant  1995   NO PAST SURGERIES     TUBAL LIGATION  08/08/2015   WISDOM TOOTH EXTRACTION  1985   Patient Active Problem List   Diagnosis Date Noted   Uterine leiomyoma 04/07/2020   Anxiety 04/17/2018   IUFD (intrauterine fetal death) 06-21-2014    REFERRING DIAG:  N62 (ICD-10-CM) - Macromastia  M54.6,G89.29 (ICD-10-CM) - Chronic bilateral thoracic back pain  M54.2 (ICD-10-CM) - Neck  pain  M25.511,G89.29,M25.512 (ICD-10-CM) - Chronic pain of both shoulders    THERAPY DIAG:  Other low back pain  Pain in thoracic spine  Cervicalgia  Muscle weakness (generalized)  Abnormal posture  Rationale for Evaluation and Treatment Rehabilitation  PERTINENT HISTORY: history of C-section   PRECAUTIONS: none   SUBJECTIVE:  SUBJECTIVE STATEMENT:  "A little headachy and TMJ issue, but otherwise I'm ok. Lower back hurts. Upper back is ok. Neck is a little bit sore."     PAIN:  Are you having pain? Yes: NPRS scale: 4/10 Pain location: low back, neck  Pain description: constant, ache Aggravating factors: heavy lifting, emptying dishwasher, bending Relieving factors: pilates   OBJECTIVE: (objective measures completed at initial evaluation unless otherwise dated)  DIAGNOSTIC FINDINGS:  None    PATIENT SURVEYS:  N/A   COGNITION: Overall cognitive status: Within functional limits for tasks assessed                          SENSATION: Not tested   MUSCLE LENGTH: Not tested    POSTURE: rounded shoulders, forward head, and anterior pelvic tilt   PALPATION: TTP bilateral thoracolumbar paraspinals, pain and hypomobility with PAIVM L-spine and T-spine  CERVICAL ROM:    Active ROM A/PROM (deg) eval AROM 04/25/23 AROM 05/02/23  Flexion 30    Extension 46    Right lateral flexion 17 30   Left lateral flexion 26 30   Right rotation 66 60 65  Left rotation 45 pn 55 60   (Blank rows = not tested)   LUMBAR ROM:    AROM eval 05/06/23  Flexion WNL WNL  Extension 25% limited LBP 25% limited LBP  Right lateral flexion WNL LBP WNL LBP  Left lateral flexion WNL LBP WNL LBP  Right rotation WNL WNL  Left rotation WNL WNL   (Blank rows = not tested)   LOWER EXTREMITY ROM:      Active   Right eval Left eval  Hip flexion      Hip extension      Hip abduction      Hip adduction      Hip internal rotation      Hip external rotation      Knee flexion      Knee extension      Ankle dorsiflexion      Ankle plantarflexion      Ankle inversion      Ankle eversion       (Blank rows = not tested)   LOWER EXTREMITY MMT:     MMT Right eval Left eval  Hip flexion 4 4  Hip extension 4 4  Hip abduction 4 4-  Hip adduction      Hip internal rotation      Hip external rotation      Knee flexion      Knee extension      Ankle dorsiflexion      Ankle plantarflexion      Ankle inversion      Ankle eversion       (Blank rows = not tested) UPPER EXTREMITY MMT:   MMT Right eval Left eval  Shoulder flexion 5 5  Shoulder extension      Shoulder abduction 5 5  Shoulder adduction      Shoulder extension      Shoulder internal rotation      Shoulder external rotation      Middle trapezius 4 4  Lower trapezius      Elbow flexion      Elbow extension      Wrist flexion      Wrist extension      Wrist ulnar deviation      Wrist radial deviation      Wrist pronation  Wrist supination      Grip strength       (Blank rows = not tested)   SPECIAL TESTS:  SLR (-)   FUNCTIONAL TESTS:  DNF endurance: 30 seconds  Functional lifting: excessive trunk flexion    GAIT: Distance walked: 10 ft  Assistive device utilized: None Level of assistance: Complete Independence Comments: WNL [facility] Adult PT Treatment:                                                DATE: 05/06/23 Therapeutic Exercise: Sidelying thoracic rotation x 10 each  Cat/cow x 10  Figure 4 LTR x 1 minute  Quadruped arm reach x 10  Quadruped leg extension 2 x 10  Fire hydrant 2 x 10  90/90 with resisted horizontal shoulder abduction 2 x 10 red band  Figure 4 bridge 2 x 10 Updated HEP      [facility] Adult PT Treatment:                                                DATE: 05/02/23 Therapeutic  Exercise: UBE Level 2 x 2 min each way Green band rows  Green band shoulder ext  Pec stretch in doorway  Standing open books Prone scap squeeze , W x 10, horiz x 10 Supine Bridge with green band horizontal abduction Dead Bug from 90/90-  S/L hip abduction circles to fatigue bilateral Upper trap and levator stretch     [facility] Adult PT Treatment:                                                DATE: 04/25/23 Therapeutic Exercise: UBE Level 1 x 2 minutes each way  Pec stretch in door way 15 sec x 2  - Sidelying Thoracic Rotation with Open Book  - 1 x daily - 7 x weekly - 1 sets - 10 reps - Supine Posterior Pelvic Tilt  - 1 x daily - 7 x weekly - 1 sets - 10 reps - 5 sec  hold - Supine Pelvic Tilt with Straight Leg Raise  - 1 x daily - 7 x weekly - 2 sets - 10 reps - Seated Shoulder Horizontal Abduction with Resistance  - 1 x daily - 7 x weekly - 2 sets - 10 reps - Shoulder External Rotation and Scapular Retraction with Resistance  - 1 x daily - 7 x weekly - 2 sets - 10 reps - Bridge with Hip Abduction and Resistance  - 1 x daily - 7 x weekly - 2 sets - 10 reps - Supine 90/90 Alternating Heel Touches with Posterior Pelvic Tilt  - 1 x daily - 7 x weekly - 2 sets - 10 reps - Standing Shoulder Row with Anchored Resistance  - 1 x daily - 7 x weekly - 2 sets - 10 reps - Seated Upper Trapezius Stretch  - 1 x daily - 7 x weekly - 1 sets - 3 reps - 15-20 hold - Seated Levator Scapulae Stretch  - 1 x daily - 7 x weekly - 1 sets - 3 reps -  15-20 hold    [facility] Adult PT Treatment:                                                DATE: 04/17/23 Therapeutic Exercise: Sidelying thoracic rotation x 10  Supine TA march x 10  90/90 march 2 x 10  Hip bridge with abduction 2 x 10  SLR with posterior pelvic 2 x 10  Sidelying hip cirlces 2 x10 CW/CCW Seated shoulder horizontal shoulder abduction green band 2 x 10  Bilateral shoulder ER green band 2 x 10  Resisted rows black band 2 x 15 Updated HEP           PATIENT EDUCATION:  Education details: see treatment Person educated: Patient Education method: Explanation, Demonstration, Tactile cues, Verbal cues, and Handouts Education comprehension: verbalized understanding, returned demonstration, verbal cues required, tactile cues required, and needs further education   HOME EXERCISE PROGRAM: Access Code: [email or web address] URL: [email or web address] Date: 04/11/2023 Prepared by: Letitia Libra   Exercises - Sidelying Thoracic Rotation with Open Book  - 1 x daily - 7 x weekly - 1 sets - 10 reps - Supine Posterior Pelvic Tilt  - 1 x daily - 7 x weekly - 1 sets - 10 reps - 5 sec  hold - Supine March with Posterior Pelvic Tilt  - 1 x daily - 7 x weekly - 2 sets - 10 reps - Seated Shoulder Horizontal Abduction with Resistance  - 1 x daily - 7 x weekly - 2 sets - 10 reps - Shoulder External Rotation and Scapular Retraction with Resistance  - 1 x daily - 7 x weekly - 2 sets - 10 reps 04/17/23 - Bridge with Hip Abduction and Resistance  - 1 x daily - 7 x weekly - 2 sets - 10 reps - Supine 90/90 Alternating Heel Touches with Posterior Pelvic Tilt  - 1 x daily - 7 x weekly - 2 sets - 10 reps - Supine Pelvic Tilt with Straight Leg Raise  - 1 x daily - 7 x weekly - 2 sets - 10 reps - Standing Shoulder Row with Anchored Resistance  - 1 x daily - 7 x weekly - 2 sets - 10 reps  04/25/23 - Seated Upper Trapezius Stretch  - 1 x daily - 7 x weekly - 1 sets - 3 reps - 15-20 hold - Seated Levator Scapulae Stretch  - 1 x daily - 7 x weekly - 1 sets - 3 reps - 15-20 hold 05/02/23 - Sidelying Hip Circles  - 1 x daily - 7 x weekly - 2-3 sets - 10 reps  ASSESSMENT:   CLINICAL IMPRESSION: Patient arrives with moderate back and neck pain. She continues to report painful trunk extension and lateral flexion AROM. Continued with core and postural strengthening with good tolerance. Mild difficulty maintain lumbopelvic stability with quadruped strength progression. No  increase in pain reported throughout session. HEP was updated to include further strengthening.     OBJECTIVE IMPAIRMENTS: decreased activity tolerance, decreased knowledge of condition, decreased ROM, decreased strength, hypomobility, impaired flexibility, improper body mechanics, postural dysfunction, and pain.    ACTIVITY LIMITATIONS: carrying, lifting, bending, sitting, squatting, and reach over head   PARTICIPATION LIMITATIONS: meal prep, cleaning, laundry, shopping, and community activity   PERSONAL FACTORS: Age, Fitness, Profession, Time since onset of injury/illness/exacerbation, and 1 comorbidity: see above  are  also affecting patient's functional outcome.    REHAB POTENTIAL: Good   CLINICAL DECISION MAKING: Stable/uncomplicated   EVALUATION COMPLEXITY: Low     GOALS: Goals reviewed with patient? Yes   SHORT TERM GOALS: Target date: 05/01/2023     Patient will be independent and compliant with initial HEP.  Baseline: see above 05/02/23: compliant most days Goal status: MET   2.  Patient will demonstrate at least 60 degrees of Lt cervical rotation AROM to improve ability to complete head turns while driving.  Baseline: see above  05/02/23: see chart  Goal status: MET    3.  Patient will demonstrate pain free lumbar AROM to improve ability to complete reaching and bending activity.  Baseline: see above  Goal status: ongoing      LONG TERM GOALS: Target date: 05/24/23       Patient will be able to lift at least 15 lbs with proper form without back pain.  Baseline: see above Goal status: INITIAL   2.  Patient will demonstrate 5/5 middle trap strength to improve postural stability. Baseline: see above Goal status: INITIAL   3.  Patient will demonstrate 5/5 bilateral hip strength to improve lumbopelvic stability.  Baseline: see above Goal status: INITIAL   4.  Patient will report pain at worst rated as </=5/10 to reduce her current functional limitations.   Baseline: see above Goal status: INITIAL   5.  Patient will be independent with advanced home program to assist in management of her chronic condition.  Baseline: see above  Goal status: INITIAL     PLAN:   PT FREQUENCY: 1x/week   PT DURATION: 6 weeks   PLANNED INTERVENTIONS: Therapeutic exercises, Therapeutic activity, Neuromuscular re-education, Balance training, Patient/Family education, Self Care, Dry Needling, Electrical stimulation, Cryotherapy, Moist heat, Traction, Manual therapy, and Re-evaluation.   PLAN FOR NEXT SESSION: review and progress HEP prn; postural strengthening, core/hip strengthening Letitia Libra, PT, DPT, ATC 05/06/23 5:42 PM

## 2023-05-16 ENCOUNTER — Ambulatory Visit: Payer: 59

## 2023-05-16 DIAGNOSIS — M5459 Other low back pain: Secondary | ICD-10-CM

## 2023-05-16 DIAGNOSIS — R293 Abnormal posture: Secondary | ICD-10-CM

## 2023-05-16 DIAGNOSIS — M546 Pain in thoracic spine: Secondary | ICD-10-CM | POA: Diagnosis not present

## 2023-05-16 DIAGNOSIS — M6281 Muscle weakness (generalized): Secondary | ICD-10-CM

## 2023-05-16 DIAGNOSIS — M542 Cervicalgia: Secondary | ICD-10-CM

## 2023-05-16 NOTE — Therapy (Signed)
OUTPATIENT PHYSICAL THERAPY TREATMENT NOTE  PHYSICAL THERAPY DISCHARGE SUMMARY  Visits from Start of Care: 6  Current functional level related to goals / functional outcomes: See goals below    Remaining deficits: Back,neck, shoulder pain Limited trunk extension AROM   Education / Equipment: See education below    Patient agrees to discharge. Patient goals were partially met. Patient is being discharged due to maximized rehab potential.   Patient Name: Marissa Gonzalez MRN: [id number] DOB:[birth year], 47 y.o., female Today's Date: 05/16/2023  PCP: Jarold Motto, PA  REFERRING PROVIDER:  Santiago Glad, MD   END OF SESSION:   PT End of Session - 05/16/23 1015     Visit Number 6    Number of Visits 6    Date for PT Re-Evaluation 05/24/23    Authorization Type [employer]    PT Start Time 1015    PT Stop Time 1053    PT Time Calculation (min) 38 min    Activity Tolerance Patient tolerated treatment well    Behavior During Therapy [facility] for tasks assessed/performed               Past Medical History:  Diagnosis Date   Abnormal Pap smear    Anxiety    Fibroid 2012   this pregnancy   GERD (gastroesophageal reflux disease)    pregnancy related   Headache(784.0)    Heart murmur    no problems   Hx of migraines    as teenager   Lactose intolerance    Sciatic nerve pain 2012   this pregnancy   Past Surgical History:  Procedure Laterality Date   CESAREAN SECTION  03/10/2012   Procedure: CESAREAN SECTION;  Surgeon: Jeani Hawking, MD;x 1  Location: [facility];  Service: Gynecology;  Laterality: N/A;   CESAREAN SECTION  08/08/2015   CESAREAN SECTION WITH BILATERAL TUBAL LIGATION Bilateral 08/08/2015   Procedure: CESAREAN SECTION WITH Left Partial Salpingectomy, and Right Ligation of Fallopian tube with Filshie Clip.;  Surgeon: Zelphia Cairo, MD;  Location: [facility];  Service: Obstetrics;  Laterality: Bilateral;  edc 08/12/15 [name] RNFA confirmed 7/29...tms     dental implant  1995   NO PAST SURGERIES     TUBAL LIGATION  08/08/2015   WISDOM TOOTH EXTRACTION  1985   Patient Active Problem List   Diagnosis Date Noted   Uterine leiomyoma 04/07/2020   Anxiety 04/17/2018   IUFD (intrauterine fetal death) June 16, 2014    REFERRING DIAG:  N62 (ICD-10-CM) - Macromastia  M54.6,G89.29 (ICD-10-CM) - Chronic bilateral thoracic back pain  M54.2 (ICD-10-CM) - Neck pain  M25.511,G89.29,M25.512 (ICD-10-CM) - Chronic pain of both shoulders    THERAPY DIAG:  Other low back pain  Pain in thoracic spine  Cervicalgia  Muscle weakness (generalized)  Abnormal posture  Rationale for Evaluation and Treatment Rehabilitation  PERTINENT HISTORY: history of C-section   PRECAUTIONS: none   SUBJECTIVE:  SUBJECTIVE STATEMENT:  "I'm ok. I woke up with a lot of stiffness this morning. Otherwise I feel ok.     PAIN:  Are you having pain? Yes: NPRS scale: 3/10 Pain location: low back, neck  Pain description: constant, ache, stiff  Aggravating factors: heavy lifting, emptying dishwasher, bending Relieving factors: pilates   OBJECTIVE: (objective measures completed at initial evaluation unless otherwise dated)  DIAGNOSTIC FINDINGS:  None    PATIENT SURVEYS:  N/A   COGNITION: Overall cognitive status: Within functional limits for tasks assessed                          SENSATION: Not tested   MUSCLE LENGTH: Not tested    POSTURE: rounded shoulders, forward head, and anterior pelvic tilt   PALPATION: TTP bilateral thoracolumbar paraspinals, pain and hypomobility with PAIVM L-spine and T-spine  CERVICAL ROM:    Active ROM A/PROM (deg) eval AROM 04/25/23 AROM 05/02/23 05/16/23  Flexion 30   55  Extension 46   53  Right lateral flexion 17 30  30   Left lateral  flexion 26 30  35  Right rotation 66 60 65 65  Left rotation 45 pn 55 60 60   (Blank rows = not tested)   LUMBAR ROM:    AROM eval 05/06/23 05/16/23  Flexion WNL WNL WNL  Extension 25% limited LBP 25% limited LBP 25% limited  Right lateral flexion WNL LBP WNL LBP WNL  Left lateral flexion WNL LBP WNL LBP WNL LBP  Right rotation WNL WNL WNL  Left rotation WNL WNL WNL LBP   (Blank rows = not tested)   LOWER EXTREMITY ROM:      Active  Right eval Left eval  Hip flexion      Hip extension      Hip abduction      Hip adduction      Hip internal rotation      Hip external rotation      Knee flexion      Knee extension      Ankle dorsiflexion      Ankle plantarflexion      Ankle inversion      Ankle eversion       (Blank rows = not tested)   LOWER EXTREMITY MMT:     MMT Right eval Left eval 05/16/23  Hip flexion 4 4 5  bilateral   Hip extension 4 4 5  bilateral   Hip abduction 4 4- 4+ bilateral   Hip adduction       Hip internal rotation       Hip external rotation       Knee flexion       Knee extension       Ankle dorsiflexion       Ankle plantarflexion       Ankle inversion       Ankle eversion        (Blank rows = not tested) UPPER EXTREMITY MMT:   MMT Right eval Left eval 05/16/23  Shoulder flexion 5 5   Shoulder extension       Shoulder abduction 5 5   Shoulder adduction       Shoulder extension       Shoulder internal rotation       Shoulder external rotation       Middle trapezius 4 4 5  bilateral   Lower trapezius       Elbow flexion  Elbow extension       Wrist flexion       Wrist extension       Wrist ulnar deviation       Wrist radial deviation       Wrist pronation       Wrist supination       Grip strength        (Blank rows = not tested)   SPECIAL TESTS:  SLR (-)   FUNCTIONAL TESTS:  DNF endurance: 30 seconds  Functional lifting: excessive trunk flexion    GAIT: Distance walked: 10 ft  Assistive device utilized: None Level  of assistance: Complete Independence Comments: WNL [facility] Adult PT Treatment:                                                DATE: 05/16/23 Therapeutic Exercise: Performed advanced HEP Exercises - Sidelying Thoracic Rotation with Open Book  - 1 x daily - 7 x weekly - 1 sets - 10 reps - Seated Levator Scapulae Stretch  - 1 x daily - 7 x weekly - 1 sets - 3 reps - 15-20 hold - Seated Upper Trapezius Stretch  - 1 x daily - 7 x weekly - 1 sets - 3 reps - 15-20 hold - Seated Shoulder Horizontal Abduction with Resistance  - 1 x daily - 3 x weekly - 2 sets - 10 reps - Standing Shoulder Diagonal Horizontal Abduction 60/120 Degrees with Resistance  - 1 x daily - 3 x weekly - 3 sets - 10 reps - Shoulder External Rotation and Scapular Retraction with Resistance  - 1 x daily - 3 x weekly - 2 sets - 10 reps - Standing Shoulder Row with Anchored Resistance  - 1 x daily - 3 x weekly - 2 sets - 10 reps - Supine 90/90 Alternating Heel Touches with Posterior Pelvic Tilt  - 1 x daily - 3 x weekly - 2 sets - 10 reps - Supine Pelvic Tilt with Straight Leg Raise  - 1 x daily - 3 x weekly - 2 sets - 10 reps - Sidelying Hip Circles  - 1 x daily - 3 x weekly - 2-3 sets - 10 reps - Quadruped Alternating Leg Extensions  - 1 x daily - 3 x weekly - 2 sets - 10 reps - Quadruped Fire Hydrant  - 1 x daily - 3 x weekly - 2 sets - 10 reps - Figure 4 Bridge  - 1 x daily - 3 x weekly - 2 sets - 10 reps  Therapeutic Activity: Re-assessment to determine overall progress, educating patient on progress towards goals.    [facility] Adult PT Treatment:                                                DATE: 05/06/23 Therapeutic Exercise: Sidelying thoracic rotation x 10 each  Cat/cow x 10  Figure 4 LTR x 1 minute  Quadruped arm reach x 10  Quadruped leg extension 2 x 10  Fire hydrant 2 x 10  90/90 with resisted horizontal shoulder abduction 2 x 10 red band  Figure 4 bridge 2 x 10 Updated HEP      [facility] Adult PT Treatment:  DATE: 05/02/23 Therapeutic Exercise: UBE Level 2 x 2 min each way Green band rows  Green band shoulder ext  Pec stretch in doorway  Standing open books Prone scap squeeze , W x 10, horiz x 10 Supine Bridge with green band horizontal abduction Dead Bug from 90/90-  S/L hip abduction circles to fatigue bilateral Upper trap and levator stretch     [facility] Adult PT Treatment:                                                DATE: 04/25/23 Therapeutic Exercise: UBE Level 1 x 2 minutes each way  Pec stretch in door way 15 sec x 2  - Sidelying Thoracic Rotation with Open Book  - 1 x daily - 7 x weekly - 1 sets - 10 reps - Supine Posterior Pelvic Tilt  - 1 x daily - 7 x weekly - 1 sets - 10 reps - 5 sec  hold - Supine Pelvic Tilt with Straight Leg Raise  - 1 x daily - 7 x weekly - 2 sets - 10 reps - Seated Shoulder Horizontal Abduction with Resistance  - 1 x daily - 7 x weekly - 2 sets - 10 reps - Shoulder External Rotation and Scapular Retraction with Resistance  - 1 x daily - 7 x weekly - 2 sets - 10 reps - Bridge with Hip Abduction and Resistance  - 1 x daily - 7 x weekly - 2 sets - 10 reps - Supine 90/90 Alternating Heel Touches with Posterior Pelvic Tilt  - 1 x daily - 7 x weekly - 2 sets - 10 reps - Standing Shoulder Row with Anchored Resistance  - 1 x daily - 7 x weekly - 2 sets - 10 reps - Seated Upper Trapezius Stretch  - 1 x daily - 7 x weekly - 1 sets - 3 reps - 15-20 hold - Seated Levator Scapulae Stretch  - 1 x daily - 7 x weekly - 1 sets - 3 reps - 15-20 hold         PATIENT EDUCATION:  Education details: see treatment; d/c education  Person educated: Patient Education method: Explanation, demo, handout Education comprehension: verbalized understanding, returned demonstration   HOME EXERCISE PROGRAM: Access Code: [email or web address] URL: [email or web address]   ASSESSMENT:   CLINICAL IMPRESSION: Patient has attended 6 PT sessions  reporting ongoing neck,shoulder, and back pain. She demonstrates improvements in cervical ROM, hip and periscapular strength, and postural awareness. Despite these objective improvements she continues to experience ongoing pain. At this time she is appropriate for discharge with recommendation to f/u with referring provider for further assessment and treatment of her chronic pain with patient in agreement with this plan.     OBJECTIVE IMPAIRMENTS: decreased activity tolerance, decreased knowledge of condition, decreased ROM, decreased strength, hypomobility, impaired flexibility, improper body mechanics, postural dysfunction, and pain.    ACTIVITY LIMITATIONS: carrying, lifting, bending, sitting, squatting, and reach over head   PARTICIPATION LIMITATIONS: meal prep, cleaning, laundry, shopping, and community activity   PERSONAL FACTORS: Age, Fitness, Profession, Time since onset of injury/illness/exacerbation, and 1 comorbidity: see above  are also affecting patient's functional outcome.    REHAB POTENTIAL: Good   CLINICAL DECISION MAKING: Stable/uncomplicated   EVALUATION COMPLEXITY: Low     GOALS: Goals reviewed with patient? Yes   SHORT TERM GOALS: Target date: 05/01/2023  Patient will be independent and compliant with initial HEP.  Baseline: see above 05/02/23: compliant most days Goal status: MET   2.  Patient will demonstrate at least 60 degrees of Lt cervical rotation AROM to improve ability to complete head turns while driving.  Baseline: see above  05/02/23: see chart  Goal status: MET    3.  Patient will demonstrate pain free lumbar AROM to improve ability to complete reaching and bending activity.  Baseline: see above  Goal status: not met      LONG TERM GOALS: Target date: 05/24/23       Patient will be able to lift at least 15 lbs with proper form without back pain.  Baseline: see above 05/16/23: proper lifting no back pain Goal status: met   2.  Patient will  demonstrate 5/5 middle trap strength to improve postural stability. Baseline: see above Goal status: met   3.  Patient will demonstrate 5/5 bilateral hip strength to improve lumbopelvic stability.  Baseline: see above Goal status: partially met   4.  Patient will report pain at worst rated as </=5/10 to reduce her current functional limitations.  Baseline: see above 05/16/23: 7/10 at worst Goal status: not met    5.  Patient will be independent with advanced home program to assist in management of her chronic condition.  Baseline: see above  Goal status: met     PLAN:   PT FREQUENCY: n/a   PT DURATION: n/a   PLANNED INTERVENTIONS: Therapeutic exercises, Therapeutic activity, Neuromuscular re-education, Balance training, Patient/Family education, Self Care, Dry Needling, Electrical stimulation, Cryotherapy, Moist heat, Traction, Manual therapy, and Re-evaluation.   PLAN FOR NEXT SESSION: n/a  Letitia Libra, PT, DPT, ATC 05/16/23 10:55 AM

## 2023-05-21 ENCOUNTER — Other Ambulatory Visit: Payer: Self-pay

## 2023-06-02 ENCOUNTER — Encounter: Payer: Self-pay | Admitting: Student

## 2023-06-02 ENCOUNTER — Ambulatory Visit (INDEPENDENT_AMBULATORY_CARE_PROVIDER_SITE_OTHER): Payer: 59 | Admitting: Student

## 2023-06-02 VITALS — Wt 199.0 lb

## 2023-06-02 DIAGNOSIS — M25512 Pain in left shoulder: Secondary | ICD-10-CM | POA: Diagnosis not present

## 2023-06-02 DIAGNOSIS — M25511 Pain in right shoulder: Secondary | ICD-10-CM

## 2023-06-02 DIAGNOSIS — M546 Pain in thoracic spine: Secondary | ICD-10-CM

## 2023-06-02 DIAGNOSIS — M542 Cervicalgia: Secondary | ICD-10-CM

## 2023-06-02 DIAGNOSIS — N62 Hypertrophy of breast: Secondary | ICD-10-CM

## 2023-06-02 NOTE — Progress Notes (Signed)
   Referring Provider Jarold Motto, PA [address] [place],  [place] [address]   CC:  Chief Complaint  Patient presents with   Follow-up      Marissa Gonzalez is an 47 y.o. female.  HPI: Patient is a 47 y.o. year old female here for follow up after completing physical therapy for pain related to macromastia.   She was seen for initial consult by Dr. Ladona Ridgel on 03/31/2023.Marland Kitchen  At that time, patient complained of upper back and neck pain for several years which she felt were due to the large size of her breasts.  Patient also reported that her bra straps were digging into her shoulders causing grooving.  Patient stated that she felt her breasts were interfering with her ability to do her job and her ability to run and participate in other physical activities.  Patient was requesting surgical reduction in the size of her breasts.  On exam, patient was found to have very large pendulous breasts with grade 3 ptosis.  The STN on the right was 35 cm and the STN on the left was 35 cm.  It was believed that 950 g per breast could be removed at the time of surgery.  Physical therapy was ordered for the patient.  Today, patient presents with her husband at bedside.  She states that she is doing well.  She states that she completed at least 6 sessions of physical therapy.  She states although physical therapy was a little helpful, she is still experiencing upper back, neck and shoulder pain due to her enlarged breasts.  She states she also still gets intermittent rashes underneath her breasts as well.  Patient denies any fevers or chills.  She denies any recent changes in her health.  Patient states that she would still like to undergo bilateral breast reduction.   Review of Systems General: Denies any fevers or chills MSK: Endorses ongoing back and neck discomfort Skin: Reports intermittent rashes  Physical Exam    06/02/2023    1:11 PM 03/31/2023    9:18 AM 12/09/2022    2:33 PM  Vitals with  BMI  Height  5\' 5"    Weight 199 lbs 195 lbs   BMI  32.45   Systolic  123 112  Diastolic  82 80  Pulse  79 96    General:  No acute distress,  Alert and oriented, Non-Toxic, Normal speech and affect Psych: Normal behavior and mood Respiratory: No increased WOB MSK: Ambulatory  Assessment/Plan  Patient is interested in pursuing surgical intervention for bilateral breast reduction. Patient has completed at least 6 weeks of physical therapy for pain related to macromastia.  Discussed with patient we would submit to insurance for authorization, discussed approval could take up to 6 weeks.   I instructed the patient to call if she has any questions or concerns about anything in the meantime.  Laurena Spies 06/02/2023, 1:33 PM

## 2023-06-13 ENCOUNTER — Telehealth: Payer: Self-pay

## 2023-06-13 NOTE — Telephone Encounter (Signed)
Uploaded to [facility] for BL Breast reduction. Notified patient Via MyChart

## 2023-06-20 ENCOUNTER — Other Ambulatory Visit (HOSPITAL_COMMUNITY): Payer: Self-pay

## 2023-06-20 ENCOUNTER — Ambulatory Visit
Admission: EM | Admit: 2023-06-20 | Discharge: 2023-06-20 | Disposition: A | Discharge status: Home / Self Care | Payer: 59 | Attending: Nurse Practitioner | Admitting: Nurse Practitioner

## 2023-06-20 ENCOUNTER — Other Ambulatory Visit (HOSPITAL_BASED_OUTPATIENT_CLINIC_OR_DEPARTMENT_OTHER): Payer: Self-pay

## 2023-06-20 DIAGNOSIS — H66001 Acute suppurative otitis media without spontaneous rupture of ear drum, right ear: Secondary | ICD-10-CM

## 2023-06-20 DIAGNOSIS — B349 Viral infection, unspecified: Secondary | ICD-10-CM

## 2023-06-20 MED ORDER — AZITHROMYCIN 250 MG PO TABS
250.0000 mg | ORAL_TABLET | Freq: Every day | ORAL | 0 refills | Status: DC
  Filled 2023-06-20: qty 6, 5d supply, fill #0

## 2023-06-20 MED ORDER — BENZONATATE 200 MG PO CAPS
200.0000 mg | ORAL_CAPSULE | Freq: Three times a day (TID) | ORAL | 0 refills | Status: DC | PRN
  Filled 2023-06-20: qty 20, 7d supply, fill #0

## 2023-06-20 NOTE — Discharge Instructions (Signed)
Start Zithromax as prescribed.  He may take Tessalon as needed for cough.  Rest and fluids.  Follow-up with your PCP if your symptoms do not improve.  Please go to the ER for any worsening symptoms.  I hope you feel better soon!

## 2023-06-20 NOTE — ED Provider Notes (Signed)
[facility]    CSN: [id number] Arrival date & time: 06/20/23  1358      History   Chief Complaint No chief complaint on file.   HPI Marissa Gonzalez is a 47 y.o. female  presents for evaluation of URI symptoms for 3-4 days. Patient reports associated symptoms of cough, congestion, ear pain, sore throat. Denies N/V/D, fevers, body aches, shortness of breath. Patient does not have a hx of asthma or smoking. No known sick contacts. Pt has no other concerns at this time.   HPI  Past Medical History:  Diagnosis Date   Abnormal Pap smear    Anxiety    Fibroid 2012   this pregnancy   GERD (gastroesophageal reflux disease)    pregnancy related   Headache(784.0)    Heart murmur    no problems   Hx of migraines    as teenager   Lactose intolerance    Sciatic nerve pain 2012   this pregnancy    Patient Active Problem List   Diagnosis Date Noted   Uterine leiomyoma 04/07/2020   Anxiety 04/17/2018   IUFD (intrauterine fetal death) 2014-06-28    Past Surgical History:  Procedure Laterality Date   CESAREAN SECTION  03/10/2012   Procedure: CESAREAN SECTION;  Surgeon: Jeani Hawking, MD;x 1  Location: [facility];  Service: Gynecology;  Laterality: N/A;   CESAREAN SECTION  08/08/2015   CESAREAN SECTION WITH BILATERAL TUBAL LIGATION Bilateral 08/08/2015   Procedure: CESAREAN SECTION WITH Left Partial Salpingectomy, and Right Ligation of Fallopian tube with Filshie Clip.;  Surgeon: Zelphia Cairo, MD;  Location: [facility];  Service: Obstetrics;  Laterality: Bilateral;  edc 08/12/15 [name] RNFA confirmed 7/29...tms    dental implant  1995   NO PAST SURGERIES     TUBAL LIGATION  08/08/2015   WISDOM TOOTH EXTRACTION  1985    OB History     Gravida  4   Para  3   Term  2   Preterm  1   AB  1   Living  2      SAB  1   IAB      Ectopic      Multiple  0   Live Births  2            Home Medications    Prior to Admission medications    Medication Sig Start Date End Date Taking? Authorizing Provider  azithromycin (ZITHROMAX) 250 MG tablet Take 1 tablet (250 mg total) by mouth daily. Take first 2 tablets together, then 1 every day until finished. 06/20/23  Yes Radford Pax, NP  benzonatate (TESSALON) 200 MG capsule Take 1 capsule (200 mg total) by mouth 3 (three) times daily as needed for cough. 06/20/23  Yes Radford Pax, NP  citalopram (CELEXA) 20 MG tablet Take 1 tablet (20 mg total) by mouth daily. 11/26/22  Yes Jarold Motto, PA  LORazepam (ATIVAN) 0.5 MG tablet Take 0.5 mg by mouth as needed for anxiety.   Yes [provider]  cetirizine (ZYRTEC) 10 MG tablet Take 10 mg by mouth daily.    [provider]  ibuprofen (ADVIL,MOTRIN) 600 MG tablet Take 1 tablet (600 mg total) by mouth every 6 (six) hours. 08/10/15   Julio Sicks, NP  minoxidil (LONITEN) 2.5 MG tablet Take by mouth daily. Take 1.25mg  by mouth daily.    [provider]  Multiple Vitamins-Minerals (HAIR SKIN AND NAILS FORMULA) TABS Take 2 each by mouth  daily.    [provider]  OVER THE COUNTER MEDICATION Take 1 tablet by mouth daily in the afternoon. MMN Amino Acid    [provider]  promethazine-dextromethorphan (PROMETHAZINE-DM) 6.25-15 MG/5ML syrup Take 5 mLs by mouth 3 (three) times daily as needed for cough. 12/09/22   Wallis Bamberg, PA-C    Family History Family History  Problem Relation Age of Onset   Cancer Maternal Grandmother    Arthritis Maternal Grandmother    Miscarriages / Stillbirths Maternal Grandmother    Stroke Maternal Grandmother    Depression Mother        per prenatal   Hypertension Mother    Depression Father        per prenatal   Hearing loss Father    Hypertension Father    Hyperlipidemia Father    Depression Brother    Heart attack Maternal Grandfather    Heart disease Maternal Grandfather    Hypertension Maternal Grandfather    Heart attack Paternal Grandfather    Heart  disease Paternal Grandfather     Social History Social History   Tobacco Use   Smoking status: Former    Types: Cigarettes    Quit date: 11/13/1999    Years since quitting: 23.6   Smokeless tobacco: Never  Vaping Use   Vaping Use: Never used  Substance Use Topics   Alcohol use: Yes    Comment: 2 drinks per weekend beer and liquor   Drug use: Yes    Types: Marijuana    Comment: couple times a month     Allergies   Augmentin [amoxicillin-pot clavulanate], Fluticasone, and Sulfa antibiotics   Review of Systems Review of Systems  HENT:  Positive for congestion, ear pain and sore throat.   Respiratory:  Positive for cough.      Physical Exam Triage Vital Signs ED Triage Vitals [06/20/23 1424]  Enc Vitals Group     BP 107/65     Pulse Rate 82     Resp 18     Temp 98.3 F (36.8 C)     Temp Source Oral     SpO2 98 %     Weight      Height      Head Circumference      Peak Flow      Pain Score      Pain Loc      Pain Edu?      Excl. in GC?    No data found.  Updated Vital Signs BP 107/65 (BP Location: Left Arm)   Pulse 82   Temp 98.3 F (36.8 C) (Oral)   Resp 18   SpO2 98%   Visual Acuity Right Eye Distance:   Left Eye Distance:   Bilateral Distance:    Right Eye Near:   Left Eye Near:    Bilateral Near:     Physical Exam Vitals and nursing note reviewed.  Constitutional:      General: She is not in acute distress.    Appearance: Normal appearance. She is well-developed. She is not ill-appearing.  HENT:     Head: Normocephalic and atraumatic.     Right Ear: Tympanic membrane and ear canal normal.     Left Ear: Tympanic membrane and ear canal normal.     Nose: Congestion present.     Right Turbinates: Not swollen or pale.     Left Turbinates: Not swollen or pale.     Right Sinus: Maxillary sinus tenderness present. No  frontal sinus tenderness.     Left Sinus: Maxillary sinus tenderness present. No frontal sinus tenderness.     Mouth/Throat:      Mouth: Mucous membranes are moist.     Pharynx: Oropharynx is clear. Uvula midline. Posterior oropharyngeal erythema present.     Tonsils: No tonsillar exudate or tonsillar abscesses.  Eyes:     Conjunctiva/sclera: Conjunctivae normal.     Pupils: Pupils are equal, round, and reactive to light.  Cardiovascular:     Rate and Rhythm: Normal rate and regular rhythm.     Heart sounds: Normal heart sounds.  Pulmonary:     Effort: Pulmonary effort is normal.     Breath sounds: Normal breath sounds.  Musculoskeletal:     Cervical back: Normal range of motion and neck supple.  Lymphadenopathy:     Cervical: No cervical adenopathy.  Skin:    General: Skin is warm and dry.  Neurological:     General: No focal deficit present.     Mental Status: She is alert and oriented to person, place, and time.  Psychiatric:        Mood and Affect: Mood normal.        Behavior: Behavior normal.      UC Treatments / Results  Labs (all labs ordered are listed, but only abnormal results are displayed) Labs Reviewed - No data to display  EKG   Radiology No results found.  Procedures Procedures (including critical care time)  Medications Ordered in UC Medications - No data to display  Initial Impression / Assessment and Plan / UC Course  I have reviewed the triage vital signs and the nursing notes.  Pertinent labs & imaging results that were available during my care of the patient were reviewed by me and considered in my medical decision making (see chart for details).     Reviewed exam and symptoms with patient.  No red flags.  Will start Zithromax for right OM.  Patient states she cannot take amoxicillin and Zithromax always works for her.  Tessalon as needed for cough.  Rest and fluids.  PCP follow-up if symptoms do not improve.  ER precautions reviewed and patient verbalized understanding. Final Clinical Impressions(s) / UC Diagnoses   Final diagnoses:  Acute suppurative otitis  media of right ear without spontaneous rupture of tympanic membrane, recurrence not specified  Acute viral syndrome     Discharge Instructions      Start Zithromax as prescribed.  He may take Tessalon as needed for cough.  Rest and fluids.  Follow-up with your PCP if your symptoms do not improve.  Please go to the ER for any worsening symptoms.  I hope you feel better soon!    ED Prescriptions     Medication Sig Dispense Auth. Provider   azithromycin (ZITHROMAX) 250 MG tablet Take 1 tablet (250 mg total) by mouth daily. Take first 2 tablets together, then 1 every day until finished. 6 tablet Radford Pax, NP   benzonatate (TESSALON) 200 MG capsule Take 1 capsule (200 mg total) by mouth 3 (three) times daily as needed for cough. 20 capsule Radford Pax, NP      PDMP not reviewed this encounter.   Radford Pax, NP 06/20/23 [phone]

## 2023-06-20 NOTE — ED Triage Notes (Signed)
Pt presents for sore throat,ear pain and nasal congestion. Pt thinks she has sinus infection.

## 2023-07-09 ENCOUNTER — Telehealth: Payer: Self-pay

## 2023-07-09 ENCOUNTER — Encounter: Payer: Self-pay | Admitting: Physician Assistant

## 2023-07-09 NOTE — Telephone Encounter (Signed)
Approval for [id number] good thru 6/25-12/25/24 authorization#[id number]. Notified patient via MyChart. Case is on [facility] desk to be scheduled for surgery.

## 2023-07-23 ENCOUNTER — Encounter (INDEPENDENT_AMBULATORY_CARE_PROVIDER_SITE_OTHER): Payer: Self-pay

## 2023-08-07 ENCOUNTER — Telehealth: Payer: Self-pay | Admitting: Plastic Surgery

## 2023-08-07 NOTE — Telephone Encounter (Signed)
Patient called to find out when her surgery is going to be scheduled.  She is already approved by insurance.  Please call her at [phone].

## 2023-08-07 NOTE — Telephone Encounter (Signed)
LVM to schedule sx 

## 2023-08-11 ENCOUNTER — Ambulatory Visit (INDEPENDENT_AMBULATORY_CARE_PROVIDER_SITE_OTHER): Payer: 59 | Admitting: Physician Assistant

## 2023-08-11 ENCOUNTER — Encounter: Payer: Self-pay | Admitting: Physician Assistant

## 2023-08-11 ENCOUNTER — Other Ambulatory Visit (HOSPITAL_COMMUNITY): Payer: Self-pay

## 2023-08-11 VITALS — BP 120/84 | HR 73 | Temp 97.7°F | Ht 65.0 in | Wt 198.2 lb

## 2023-08-11 DIAGNOSIS — F419 Anxiety disorder, unspecified: Secondary | ICD-10-CM | POA: Diagnosis not present

## 2023-08-11 DIAGNOSIS — F32A Depression, unspecified: Secondary | ICD-10-CM | POA: Diagnosis not present

## 2023-08-11 DIAGNOSIS — E669 Obesity, unspecified: Secondary | ICD-10-CM

## 2023-08-11 DIAGNOSIS — Z1211 Encounter for screening for malignant neoplasm of colon: Secondary | ICD-10-CM | POA: Diagnosis not present

## 2023-08-11 DIAGNOSIS — Z0000 Encounter for general adult medical examination without abnormal findings: Secondary | ICD-10-CM

## 2023-08-11 DIAGNOSIS — Z13220 Encounter for screening for lipoid disorders: Secondary | ICD-10-CM | POA: Diagnosis not present

## 2023-08-11 LAB — CBC WITH DIFFERENTIAL/PLATELET
Basophils Absolute: 0 10*3/uL (ref 0.0–0.1)
Basophils Relative: 0.3 % (ref 0.0–3.0)
Eosinophils Absolute: 0 10*3/uL (ref 0.0–0.7)
Eosinophils Relative: 0.4 % (ref 0.0–5.0)
HCT: 42.5 % (ref 36.0–46.0)
Hemoglobin: 13.7 g/dL (ref 12.0–15.0)
Lymphocytes Relative: 35.2 % (ref 12.0–46.0)
Lymphs Abs: 2.3 10*3/uL (ref 0.7–4.0)
MCHC: 32.2 g/dL (ref 30.0–36.0)
MCV: 85.2 fl (ref 78.0–100.0)
Monocytes Absolute: 0.5 10*3/uL (ref 0.1–1.0)
Monocytes Relative: 8.1 % (ref 3.0–12.0)
Neutro Abs: 3.7 10*3/uL (ref 1.4–7.7)
Neutrophils Relative %: 56 % (ref 43.0–77.0)
Platelets: 263 10*3/uL (ref 150.0–400.0)
RBC: 4.99 Mil/uL (ref 3.87–5.11)
RDW: 13.4 % (ref 11.5–15.5)
WBC: 6.7 10*3/uL (ref 4.0–10.5)

## 2023-08-11 MED ORDER — LORAZEPAM 0.5 MG PO TABS
0.5000 mg | ORAL_TABLET | ORAL | 1 refills | Status: DC | PRN
  Filled 2023-08-11: qty 30, 30d supply, fill #0
  Filled 2023-11-16: qty 30, 30d supply, fill #1

## 2023-08-11 MED ORDER — BUPROPION HCL ER (XL) 150 MG PO TB24
150.0000 mg | ORAL_TABLET | Freq: Every day | ORAL | 1 refills | Status: DC
  Filled 2023-08-11: qty 90, 90d supply, fill #0
  Filled 2023-11-05: qty 90, 90d supply, fill #1

## 2023-08-11 NOTE — Progress Notes (Signed)
Subjective:    Marissa Gonzalez is a 47 y.o. female and is here for a comprehensive physical exam.  HPI  Health Maintenance Due  Topic Date Due   PAP SMEAR-Modifier  01/16/2021   Colonoscopy  Never done    Acute Concerns: None  Chronic Issues: Depression/Anxiety: Compliant with 20 mg Celexa. Reports low libido, though external factors may also be contributing.  Complains of difficulty concentrating and finishing work. Still working full-time.  Discussed adding Wellbutrin along with Celexa. Notes anxiety has been well controlled. Takes 0.5 mg Lorazepam once per week.  States she has previously tried OTC mushroom supplement with no improvement.   Health Maintenance: Immunizations -- UpToDate Colonoscopy -- Will schedule after 10/01 surgery. Referral placed today. Mammogram -- Last done 02/07/23. Results were normal.  PAP -- Last done 01/16/18 (overdue since 01/16/21). Results were normal. She has appointment scheduled with obstetrics-gynecology soon. Bone Density -- N/A Diet -- healthy overall.  Exercise -- Regular overall.   Sleep habits -- Good sleep quality. Mood -- Stable.   UTD with dentist? - Yes UTD with eye doctor? - Yes  Weight history: Wt Readings from Last 10 Encounters:  08/11/23 198 lb 4 oz (89.9 kg)  06/02/23 199 lb (90.3 kg)  03/31/23 195 lb (88.5 kg)  08/09/22 190 lb 8 oz (86.4 kg)  05/31/21 182 lb 3.2 oz (82.6 kg)  05/12/20 172 lb 8 oz (78.2 kg)  02/18/19 190 lb (86.2 kg)  11/17/18 187 lb 3.2 oz (84.9 kg)  04/24/18 186 lb (84.4 kg)  04/17/18 187 lb 6.1 oz (85 kg)   Body mass index is 32.99 kg/m. No LMP recorded. (Menstrual status: IUD).  Alcohol use:  reports current alcohol use.  Tobacco use:  Tobacco Use: Medium Risk (08/11/2023)   Patient History    Smoking Tobacco Use: Former    Smokeless Tobacco Use: Never    Passive Exposure: Not on file   Eligible for lung cancer screening? n     08/11/2023    1:03 PM  Depression screen PHQ  2/9  Decreased Interest 1  Down, Depressed, Hopeless 1  PHQ - 2 Score 2  Altered sleeping 1  Tired, decreased energy 1  Change in appetite 2  Feeling bad or failure about yourself  0  Trouble concentrating 2  Moving slowly or fidgety/restless 2  Suicidal thoughts 2  PHQ-9 Score 12  Difficult doing work/chores Somewhat difficult     Other providers/specialists: Patient Care Team: Jarold Motto, [place] as PCP - General (Physician Assistant)    PMHx, SurgHx, SocialHx, Medications, and Allergies were reviewed in the Visit Navigator and updated as appropriate.   Past Medical History:  Diagnosis Date   Abnormal Pap smear    Anxiety    Fibroid 2012   this pregnancy   GERD (gastroesophageal reflux disease)    pregnancy related   Headache(784.0)    Heart murmur    no problems   Hx of migraines    as teenager   Lactose intolerance    Sciatic nerve pain 2012   this pregnancy     Past Surgical History:  Procedure Laterality Date   CESAREAN SECTION  03/10/2012   Procedure: CESAREAN SECTION;  Surgeon: Jeani Hawking, MD;x 1  Location: [facility];  Service: Gynecology;  Laterality: N/A;   CESAREAN SECTION  08/08/2015   CESAREAN SECTION WITH BILATERAL TUBAL LIGATION Bilateral 08/08/2015   Procedure: CESAREAN SECTION WITH Left Partial Salpingectomy, and Right Ligation of Fallopian tube with Filshie  Clip.;  Surgeon: Zelphia Cairo, MD;  Location: [facility];  Service: Obstetrics;  Laterality: Bilateral;  edc 08/12/15 [name] RNFA confirmed 7/29...tms    dental implant  1995   NO PAST SURGERIES     TUBAL LIGATION  08/08/2015   WISDOM TOOTH EXTRACTION  1985     Family History  Problem Relation Age of Onset   Cancer Maternal Grandmother    Arthritis Maternal Grandmother    Miscarriages / Stillbirths Maternal Grandmother    Stroke Maternal Grandmother    Depression Mother        per prenatal   Hypertension Mother    Depression Father        per prenatal   Hearing loss Father     Hypertension Father    Hyperlipidemia Father    Depression Brother    Heart attack Maternal Grandfather    Heart disease Maternal Grandfather    Hypertension Maternal Grandfather    Heart attack Paternal Grandfather    Heart disease Paternal Grandfather     Social History   Tobacco Use   Smoking status: Former    Current packs/day: 0.00    Types: Cigarettes    Quit date: 11/13/1999    Years since quitting: 23.7   Smokeless tobacco: Never  Vaping Use   Vaping status: Never Used  Substance Use Topics   Alcohol use: Yes    Comment: 2 drinks per weekend beer and liquor   Drug use: Yes    Types: Marijuana    Comment: couple times a month    Review of Systems:   Review of Systems  Constitutional:  Negative for chills, fever, malaise/fatigue and weight loss.  HENT:  Negative for hearing loss, sinus pain and sore throat.   Respiratory:  Negative for cough and hemoptysis.   Cardiovascular:  Negative for chest pain, palpitations, leg swelling and PND.  Gastrointestinal:  Negative for abdominal pain, constipation, diarrhea, heartburn, nausea and vomiting.  Genitourinary:  Negative for dysuria, frequency and urgency.  Musculoskeletal:  Negative for back pain, myalgias and neck pain.  Skin:  Negative for itching and rash.  Neurological:  Negative for dizziness, tingling, seizures and headaches.       (+) Difficulty focusing (despite compliance with 20 mg Celexa) (+) Low libido (Celexa possibly contributing along with external factors)  Endo/Heme/Allergies:  Negative for polydipsia.  Psychiatric/Behavioral:  Negative for depression. The patient is not nervous/anxious.     Objective:   BP 120/84 (BP Location: Left Arm, Patient Position: Sitting, Cuff Size: Large)   Pulse 73   Temp 97.7 F (36.5 C) (Temporal)   Ht 5\' 5"  (1.651 m)   Wt 198 lb 4 oz (89.9 kg)   SpO2 99%   BMI 32.99 kg/m  Body mass index is 32.99 kg/m.   General Appearance:    Alert, cooperative, no  distress, appears stated age  Head:    Normocephalic, without obvious abnormality, atraumatic  Eyes:    PERRL, conjunctiva/corneas clear, EOM's intact, fundi    benign, both eyes  Ears:    Normal TM's and external ear canals, both ears  Nose:   Nares normal, septum midline, mucosa normal, no drainage    or sinus tenderness  Throat:   Lips, mucosa, and tongue normal; teeth and gums normal  Neck:   Supple, symmetrical, trachea midline, no adenopathy;    thyroid:  no enlargement/tenderness/nodules; no carotid   bruit or JVD  Back:     Symmetric, no curvature, ROM normal, no  CVA tenderness  Lungs:     Clear to auscultation bilaterally, respirations unlabored  Chest Wall:    No tenderness or deformity   Heart:    Regular rate and rhythm, S1 and S2 normal, no murmur, rub or gallop  Breast Exam:    Deferred  Abdomen:     Soft, non-tender, bowel sounds active all four quadrants,    no masses, no organomegaly  Genitalia:    Deferred   Extremities:   Extremities normal, atraumatic, no cyanosis or edema  Pulses:   2+ and symmetric all extremities  Skin:   Skin color, texture, turgor normal, no rashes or lesions  Lymph nodes:   Cervical, supraclavicular, and axillary nodes normal  Neurologic:   CNII-XII intact, normal strength, sensation and reflexes    throughout    Assessment/Plan:   Routine physical examination Today patient counseled on age appropriate routine health concerns for screening and prevention, each reviewed and up to date or declined. Immunizations reviewed and up to date or declined. Labs ordered and reviewed. Risk factors for depression reviewed and negative. Hearing function and visual acuity are intact. ADLs screened and addressed as needed. Functional ability and level of safety reviewed and appropriate. Education, counseling and referrals performed based on assessed risks today. Patient provided with a copy of personalized plan for preventive services.  Anxiety and  Depression Uncontrolled Continue celexa 20 mg daily add Wellbutrin 150 mg xl daily Denies SI/HI Reach out if new/worsening symptom(s) of lack of improvement I discussed with patient that if they develop any SI, to tell someone immediately and seek medical attention.  Special screening for malignant neoplasms, colon Colonoscopy ordered  Obesity, unspecified classification, unspecified obesity type, unspecified whether serious comorbidity present Continue efforts at healthy lifestyle   I,Emily Lagle,acting as a scribe for Jarold Motto, PA.,have documented all relevant documentation on the behalf of Jarold Motto, PA,as directed by  Jarold Motto, PA while in the presence of Jarold Motto, [place].  I, Jarold Motto, [place], have reviewed all documentation for this visit. The documentation on 08/11/23 for the exam, diagnosis, procedures, and orders are all accurate and complete.  Jarold Motto, PA-C [facility]

## 2023-08-11 NOTE — Patient Instructions (Signed)
It was great to see you!  add Wellbutrin 150 mg xl  Please go to the lab for blood work.   Our office will call you with your results unless you have chosen to receive results via MyChart.  If your blood work is normal we will follow-up each year for physicals and as scheduled for chronic medical problems.  If anything is abnormal we will treat accordingly and get you in for a follow-up.  Take care,  [name]

## 2023-08-12 LAB — COMPREHENSIVE METABOLIC PANEL
ALT: 26 U/L (ref 0–35)
AST: 19 U/L (ref 0–37)
Albumin: 4.1 g/dL (ref 3.5–5.2)
Alkaline Phosphatase: 46 U/L (ref 39–117)
BUN: 11 mg/dL (ref 6–23)
CO2: 25 meq/L (ref 19–32)
Calcium: 9.4 mg/dL (ref 8.4–10.5)
Chloride: 103 meq/L (ref 96–112)
Creatinine, Ser: 0.66 mg/dL (ref 0.40–1.20)
GFR: 105.93 mL/min (ref >60.00)
Glucose, Bld: 92 mg/dL (ref 70–99)
Potassium: 3.7 meq/L (ref 3.5–5.1)
Sodium: 136 meq/L (ref 135–145)
Total Bilirubin: 0.5 mg/dL (ref 0.2–1.2)
Total Protein: 7.2 g/dL (ref 6.0–8.3)

## 2023-08-12 LAB — LIPID PANEL
Cholesterol: 227 mg/dL — ABNORMAL HIGH (ref 0–200)
HDL: 70.6 mg/dL (ref >39.00)
LDL Cholesterol: 142 mg/dL — ABNORMAL HIGH (ref 0–99)
NonHDL: 156.69
Total CHOL/HDL Ratio: 3
Triglycerides: 73 mg/dL (ref 0.0–149.0)
VLDL: 14.6 mg/dL (ref 0.0–40.0)

## 2023-08-18 ENCOUNTER — Other Ambulatory Visit: Payer: Self-pay | Admitting: Physician Assistant

## 2023-08-18 MED ORDER — CITALOPRAM HYDROBROMIDE 20 MG PO TABS
20.0000 mg | ORAL_TABLET | Freq: Every day | ORAL | 1 refills | Status: DC
  Filled 2023-08-18: qty 90, 90d supply, fill #0
  Filled 2023-11-16: qty 90, 90d supply, fill #1

## 2023-08-19 ENCOUNTER — Other Ambulatory Visit: Payer: Self-pay

## 2023-08-19 ENCOUNTER — Other Ambulatory Visit (HOSPITAL_COMMUNITY): Payer: Self-pay

## 2023-08-26 DIAGNOSIS — Z01419 Encounter for gynecological examination (general) (routine) without abnormal findings: Secondary | ICD-10-CM | POA: Diagnosis not present

## 2023-08-26 DIAGNOSIS — Z1231 Encounter for screening mammogram for malignant neoplasm of breast: Secondary | ICD-10-CM | POA: Diagnosis not present

## 2023-08-26 DIAGNOSIS — Z6832 Body mass index (BMI) 32.0-32.9, adult: Secondary | ICD-10-CM | POA: Diagnosis not present

## 2023-08-26 LAB — HM MAMMOGRAPHY

## 2023-08-28 ENCOUNTER — Encounter: Payer: Self-pay | Admitting: Obstetrics and Gynecology

## 2023-09-01 ENCOUNTER — Encounter: Payer: Self-pay | Admitting: Surgical

## 2023-09-01 ENCOUNTER — Ambulatory Visit (INDEPENDENT_AMBULATORY_CARE_PROVIDER_SITE_OTHER): Payer: 59 | Admitting: Surgical

## 2023-09-01 VITALS — BP 116/77 | HR 74 | Ht 65.0 in | Wt 198.0 lb

## 2023-09-01 DIAGNOSIS — N62 Hypertrophy of breast: Secondary | ICD-10-CM

## 2023-09-01 MED ORDER — ONDANSETRON HCL 4 MG PO TABS
4.0000 mg | ORAL_TABLET | Freq: Three times a day (TID) | ORAL | 0 refills | Status: DC | PRN
  Filled 2023-09-01: qty 20, 7d supply, fill #0

## 2023-09-01 MED ORDER — OXYCODONE HCL 5 MG PO TABS
5.0000 mg | ORAL_TABLET | Freq: Four times a day (QID) | ORAL | 0 refills | Status: AC | PRN
  Filled 2023-09-01: qty 20, 5d supply, fill #0

## 2023-09-01 NOTE — H&P (View-Only) (Signed)
Patient ID: Marissa Gonzalez, female    DOB: Jun 09, 1976, 47 y.o.   MRN: [id number]  Chief Complaint  Patient presents with   Pre-op Exam      ICD-10-CM   1. Macromastia  N62       History of Present Illness: Marissa Gonzalez is a 47 y.o.  female  with a history of macromastia.  She presents for preoperative evaluation for upcoming procedure, Bilateral Breast Reduction  scheduled for 09/23/2023 with Dr.  Ladona Ridgel  She does not have a personal history of anesthesia, but reports she has had dental procedures under sedation without any issues.  She reports no known family history of anesthetic complications.  Summary of Previous Visit: STN 35 cm bilaterally, IMF 18 cm on the right, 17 cm on the left.  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  She has never had any cardiac or pulmonary disease.  She feels well with no recent changes to her health.  She does report that she is approximately an F cup and would like to be about a C/D cup but she is understanding that cup size is not guaranteed.  Patient reports that she had a calcium score of 0 1-year ago September 2023. She had a mammogram September 2024 BI-RADS 1, negative  Past Medical History: Allergies: Allergies  Allergen Reactions   Augmentin [Amoxicillin-Pot Clavulanate]    Fluticasone Other (See Comments)   Sulfa Antibiotics     Current Medications:  Current Outpatient Medications:    buPROPion (WELLBUTRIN XL) 150 MG 24 hr tablet, Take 1 tablet (150 mg total) by mouth daily., Disp: 90 tablet, Rfl: 1   cetirizine (ZYRTEC) 10 MG tablet, Take 10 mg by mouth daily., Disp: , Rfl:     citalopram (CELEXA) 20 MG tablet, Take 1 tablet (20 mg total) by mouth daily., Disp: 90 tablet, Rfl: 1   ibuprofen (ADVIL,MOTRIN) 600 MG tablet, Take 1 tablet (600 mg total) by mouth every 6 (six) hours., Disp: 30 tablet, Rfl: 1   levonorgestrel (MIRENA, 52 MG,) 20 MCG/DAY IUD, 1 each by Intrauterine route., Disp: , Rfl:    LORazepam (ATIVAN) 0.5 MG tablet, Take 1 tablet (0.5 mg total) by mouth as needed for anxiety., Disp: 30 tablet, Rfl: 1   minoxidil (LONITEN) 2.5 MG tablet, Take by mouth daily. Take 1.25mg  by mouth daily., Disp: , Rfl:    Multiple Vitamins-Minerals (HAIR SKIN AND NAILS FORMULA) TABS, Take 2 each by mouth daily., Disp: , Rfl:    OVER THE COUNTER MEDICATION, Take 1 tablet by mouth daily in the afternoon. NMN Amino Acid, Disp: , Rfl:    benzonatate (TESSALON) 200 MG capsule, Take 1 capsule (200 mg total) by mouth 3 (three) times daily as needed for cough., Disp: 20 capsule, Rfl: 0  Past Medical Problems: Past Medical History:  Diagnosis Date   Abnormal Pap smear    Anxiety    Fibroid 2012   this pregnancy   GERD (gastroesophageal reflux disease)    pregnancy related   Headache(784.0)    Heart murmur    no problems   Hx of migraines    as teenager   Lactose intolerance  Sciatic nerve pain 2012   this pregnancy    Past Surgical History: Past Surgical History:  Procedure Laterality Date   CESAREAN SECTION  03/10/2012   Procedure: CESAREAN SECTION;  Surgeon: Jeani Hawking, MD;x 1  Location: [facility];  Service: Gynecology;  Laterality: N/A;   CESAREAN SECTION  08/08/2015   CESAREAN SECTION WITH BILATERAL TUBAL LIGATION Bilateral 08/08/2015   Procedure: CESAREAN SECTION WITH Left Partial Salpingectomy, and Right Ligation of Fallopian tube with Filshie Clip.;  Surgeon: Zelphia Cairo, MD;  Location: [facility];  Service: Obstetrics;  Laterality: Bilateral;  edc 08/12/15 [name] RNFA confirmed 7/29...tms    dental implant  1995   NO PAST SURGERIES     TUBAL LIGATION   08/08/2015   WISDOM TOOTH EXTRACTION  1985    Social History: Social History   Socioeconomic History   Marital status: Married    Spouse name: Not on file   Number of children: Not on file   Years of education: Not on file   Highest education level: Not on file  Occupational History   Not on file  Tobacco Use   Smoking status: Former    Current packs/day: 0.00    Types: Cigarettes    Quit date: 11/13/1999    Years since quitting: 23.8   Smokeless tobacco: Never  Vaping Use   Vaping status: Never Used  Substance and Sexual Activity   Alcohol use: Yes    Comment: 2 drinks per weekend beer and liquor   Drug use: Yes    Types: Marijuana    Comment: couple times a month   Sexual activity: Yes    Birth control/protection: Surgical    Comment: tubal ligation  Other Topics Concern   Not on file  Social History Narrative   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    47 y/o [place]   47 y/o [place]   Social Determinants of Corporate investment banker Strain: Not on file  Food Insecurity: Not on file  Transportation Needs: Not on file  Physical Activity: Not on file  Stress: Not on file  Social Connections: Not on file  Intimate Partner Violence: Not on file    Family History: Family History  Problem Relation Age of Onset   Cancer Maternal Grandmother    Arthritis Maternal Grandmother    Miscarriages / Stillbirths Maternal Grandmother    Stroke Maternal Grandmother    Depression Mother        per prenatal   Hypertension Mother    Depression Father        per prenatal   Hearing loss Father    Hypertension Father    Hyperlipidemia Father    Depression Brother    Heart attack Maternal Grandfather    Heart disease Maternal Grandfather    Hypertension Maternal Grandfather    Heart attack Paternal Grandfather    Heart disease Paternal Grandfather     Review of Systems: Review of Systems  Constitutional: Negative.   Respiratory: Negative.    Cardiovascular:  Negative.   Gastrointestinal: Negative.   Musculoskeletal:  Positive for back pain.  Neurological: Negative.     Physical Exam: Vital Signs BP 116/77 (BP Location: Left Arm, Patient Position: Sitting, Cuff Size: Large)   Pulse 74   Ht 5\' 5"  (1.651 m)   Wt 198 lb (89.8 kg)   SpO2 99%   BMI 32.95 kg/m   Physical Exam Constitutional:      General: Not in acute distress.  Appearance: Normal appearance. Not ill-appearing.  HENT:     Head: Normocephalic and atraumatic.  Eyes:     Pupils: Pupils are equal, round Neck:     Musculoskeletal: Normal range of motion.  Cardiovascular:     Rate and Rhythm: Normal rate    Pulses: Normal pulses.  Pulmonary:     Effort: Pulmonary effort is normal. No respiratory distress.  Musculoskeletal: Normal range of motion.  Skin:    General: Skin is warm and dry.     Findings: No erythema or rash.  Neurological:     General: No focal deficit present.     Mental Status: Alert and oriented to person, place, and time. Mental status is at baseline.     Motor: No weakness.  Psychiatric:        Mood and Affect: Mood normal.        Behavior: Behavior normal.    Assessment/Plan: The patient is scheduled for bilateral breast reduction with Dr. Ladona Ridgel.  Risks, benefits, and alternatives of procedure discussed, questions answered and consent obtained.    Smoking Status: Non-smoker; Counseling Given?  N/A Last Mammogram: September 2024; Results: BI-RADS 1, negative  Caprini Score: 4, moderate; Risk Factors include: Age, BMI > 25, and length of planned surgery. Recommendation for mechanical prophylaxis. Encourage early ambulation.   Pictures obtained: @consult   Post-op Rx sent to pharmacy: Oxycodone, Zofran  Patient was provided with the breast reduction and General Surgical Risk consent document and Pain Medication Agreement prior to their appointment.  They had adequate time to read through the risk consent documents and Pain Medication  Agreement. We also discussed them in person together during this preop appointment. All of their questions were answered to their satisfaction.  Recommended calling if they have any further questions.  Risk consent form and Pain Medication Agreement to be scanned into patient's chart.  The risk that can be encountered with breast reduction were discussed and include the following but not limited to these:  Breast asymmetry, fluid accumulation, firmness of the breast, inability to breast feed, loss of nipple or areola, skin loss, decrease or no nipple sensation, fat necrosis of the breast tissue, bleeding, infection, healing delay.  There are risks of anesthesia, changes to skin sensation and injury to nerves or blood vessels.  The muscle can be temporarily or permanently injured.  You may have an allergic reaction to tape, suture, glue, blood products which can result in skin discoloration, swelling, pain, skin lesions, poor healing.  Any of these can lead to the need for revisonal surgery or stage procedures.  A reduction has potential to interfere with diagnostic procedures.  Nipple or breast piercing can increase risks of infection.  This procedure is best done when the breast is fully developed.  Changes in the breast will continue to occur over time.  Pregnancy can alter the outcomes of previous breast reduction surgery, weight gain and weigh loss can also effect the long term appearance.     Electronically signed by: Kermit Balo Pranish Akhavan, PA-C 09/01/2023 2:42 PM

## 2023-09-01 NOTE — Progress Notes (Signed)
Patient ID: Marissa Gonzalez, female    DOB: Jun 09, 1976, 47 y.o.   MRN: [id number]  Chief Complaint  Patient presents with   Pre-op Exam      ICD-10-CM   1. Macromastia  N62       History of Present Illness: Marissa Gonzalez is a 47 y.o.  female  with a history of macromastia.  She presents for preoperative evaluation for upcoming procedure, Bilateral Breast Reduction  scheduled for 09/23/2023 with Dr.  Ladona Ridgel  She does not have a personal history of anesthesia, but reports she has had dental procedures under sedation without any issues.  She reports no known family history of anesthetic complications.  Summary of Previous Visit: STN 35 cm bilaterally, IMF 18 cm on the right, 17 cm on the left.  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  She has never had any cardiac or pulmonary disease.  She feels well with no recent changes to her health.  She does report that she is approximately an F cup and would like to be about a C/D cup but she is understanding that cup size is not guaranteed.  Patient reports that she had a calcium score of 0 1-year ago September 2023. She had a mammogram September 2024 BI-RADS 1, negative  Past Medical History: Allergies: Allergies  Allergen Reactions   Augmentin [Amoxicillin-Pot Clavulanate]    Fluticasone Other (See Comments)   Sulfa Antibiotics     Current Medications:  Current Outpatient Medications:    buPROPion (WELLBUTRIN XL) 150 MG 24 hr tablet, Take 1 tablet (150 mg total) by mouth daily., Disp: 90 tablet, Rfl: 1   cetirizine (ZYRTEC) 10 MG tablet, Take 10 mg by mouth daily., Disp: , Rfl:     citalopram (CELEXA) 20 MG tablet, Take 1 tablet (20 mg total) by mouth daily., Disp: 90 tablet, Rfl: 1   ibuprofen (ADVIL,MOTRIN) 600 MG tablet, Take 1 tablet (600 mg total) by mouth every 6 (six) hours., Disp: 30 tablet, Rfl: 1   levonorgestrel (MIRENA, 52 MG,) 20 MCG/DAY IUD, 1 each by Intrauterine route., Disp: , Rfl:    LORazepam (ATIVAN) 0.5 MG tablet, Take 1 tablet (0.5 mg total) by mouth as needed for anxiety., Disp: 30 tablet, Rfl: 1   minoxidil (LONITEN) 2.5 MG tablet, Take by mouth daily. Take 1.25mg  by mouth daily., Disp: , Rfl:    Multiple Vitamins-Minerals (HAIR SKIN AND NAILS FORMULA) TABS, Take 2 each by mouth daily., Disp: , Rfl:    OVER THE COUNTER MEDICATION, Take 1 tablet by mouth daily in the afternoon. NMN Amino Acid, Disp: , Rfl:    benzonatate (TESSALON) 200 MG capsule, Take 1 capsule (200 mg total) by mouth 3 (three) times daily as needed for cough., Disp: 20 capsule, Rfl: 0  Past Medical Problems: Past Medical History:  Diagnosis Date   Abnormal Pap smear    Anxiety    Fibroid 2012   this pregnancy   GERD (gastroesophageal reflux disease)    pregnancy related   Headache(784.0)    Heart murmur    no problems   Hx of migraines    as teenager   Lactose intolerance  Sciatic nerve pain 2012   this pregnancy    Past Surgical History: Past Surgical History:  Procedure Laterality Date   CESAREAN SECTION  03/10/2012   Procedure: CESAREAN SECTION;  Surgeon: Jeani Hawking, MD;x 1  Location: [facility];  Service: Gynecology;  Laterality: N/A;   CESAREAN SECTION  08/08/2015   CESAREAN SECTION WITH BILATERAL TUBAL LIGATION Bilateral 08/08/2015   Procedure: CESAREAN SECTION WITH Left Partial Salpingectomy, and Right Ligation of Fallopian tube with Filshie Clip.;  Surgeon: Zelphia Cairo, MD;  Location: [facility];  Service: Obstetrics;  Laterality: Bilateral;  edc 08/12/15 [name] RNFA confirmed 7/29...tms    dental implant  1995   NO PAST SURGERIES     TUBAL LIGATION   08/08/2015   WISDOM TOOTH EXTRACTION  1985    Social History: Social History   Socioeconomic History   Marital status: Married    Spouse name: Not on file   Number of children: Not on file   Years of education: Not on file   Highest education level: Not on file  Occupational History   Not on file  Tobacco Use   Smoking status: Former    Current packs/day: 0.00    Types: Cigarettes    Quit date: 11/13/1999    Years since quitting: 23.8   Smokeless tobacco: Never  Vaping Use   Vaping status: Never Used  Substance and Sexual Activity   Alcohol use: Yes    Comment: 2 drinks per weekend beer and liquor   Drug use: Yes    Types: Marijuana    Comment: couple times a month   Sexual activity: Yes    Birth control/protection: Surgical    Comment: tubal ligation  Other Topics Concern   Not on file  Social History Narrative   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    47 y/o [place]   47 y/o [place]   Social Determinants of Corporate investment banker Strain: Not on file  Food Insecurity: Not on file  Transportation Needs: Not on file  Physical Activity: Not on file  Stress: Not on file  Social Connections: Not on file  Intimate Partner Violence: Not on file    Family History: Family History  Problem Relation Age of Onset   Cancer Maternal Grandmother    Arthritis Maternal Grandmother    Miscarriages / Stillbirths Maternal Grandmother    Stroke Maternal Grandmother    Depression Mother        per prenatal   Hypertension Mother    Depression Father        per prenatal   Hearing loss Father    Hypertension Father    Hyperlipidemia Father    Depression Brother    Heart attack Maternal Grandfather    Heart disease Maternal Grandfather    Hypertension Maternal Grandfather    Heart attack Paternal Grandfather    Heart disease Paternal Grandfather     Review of Systems: Review of Systems  Constitutional: Negative.   Respiratory: Negative.    Cardiovascular:  Negative.   Gastrointestinal: Negative.   Musculoskeletal:  Positive for back pain.  Neurological: Negative.     Physical Exam: Vital Signs BP 116/77 (BP Location: Left Arm, Patient Position: Sitting, Cuff Size: Large)   Pulse 74   Ht 5\' 5"  (1.651 m)   Wt 198 lb (89.8 kg)   SpO2 99%   BMI 32.95 kg/m   Physical Exam Constitutional:      General: Not in acute distress.  Appearance: Normal appearance. Not ill-appearing.  HENT:     Head: Normocephalic and atraumatic.  Eyes:     Pupils: Pupils are equal, round Neck:     Musculoskeletal: Normal range of motion.  Cardiovascular:     Rate and Rhythm: Normal rate    Pulses: Normal pulses.  Pulmonary:     Effort: Pulmonary effort is normal. No respiratory distress.  Musculoskeletal: Normal range of motion.  Skin:    General: Skin is warm and dry.     Findings: No erythema or rash.  Neurological:     General: No focal deficit present.     Mental Status: Alert and oriented to person, place, and time. Mental status is at baseline.     Motor: No weakness.  Psychiatric:        Mood and Affect: Mood normal.        Behavior: Behavior normal.    Assessment/Plan: The patient is scheduled for bilateral breast reduction with Dr. Ladona Ridgel.  Risks, benefits, and alternatives of procedure discussed, questions answered and consent obtained.    Smoking Status: Non-smoker; Counseling Given?  N/A Last Mammogram: September 2024; Results: BI-RADS 1, negative  Caprini Score: 4, moderate; Risk Factors include: Age, BMI > 25, and length of planned surgery. Recommendation for mechanical prophylaxis. Encourage early ambulation.   Pictures obtained: @consult   Post-op Rx sent to pharmacy: Oxycodone, Zofran  Patient was provided with the breast reduction and General Surgical Risk consent document and Pain Medication Agreement prior to their appointment.  They had adequate time to read through the risk consent documents and Pain Medication  Agreement. We also discussed them in person together during this preop appointment. All of their questions were answered to their satisfaction.  Recommended calling if they have any further questions.  Risk consent form and Pain Medication Agreement to be scanned into patient's chart.  The risk that can be encountered with breast reduction were discussed and include the following but not limited to these:  Breast asymmetry, fluid accumulation, firmness of the breast, inability to breast feed, loss of nipple or areola, skin loss, decrease or no nipple sensation, fat necrosis of the breast tissue, bleeding, infection, healing delay.  There are risks of anesthesia, changes to skin sensation and injury to nerves or blood vessels.  The muscle can be temporarily or permanently injured.  You may have an allergic reaction to tape, suture, glue, blood products which can result in skin discoloration, swelling, pain, skin lesions, poor healing.  Any of these can lead to the need for revisonal surgery or stage procedures.  A reduction has potential to interfere with diagnostic procedures.  Nipple or breast piercing can increase risks of infection.  This procedure is best done when the breast is fully developed.  Changes in the breast will continue to occur over time.  Pregnancy can alter the outcomes of previous breast reduction surgery, weight gain and weigh loss can also effect the long term appearance.     Electronically signed by: Kermit Balo Pranish Akhavan, PA-C 09/01/2023 2:42 PM

## 2023-09-02 ENCOUNTER — Other Ambulatory Visit (HOSPITAL_COMMUNITY): Payer: Self-pay

## 2023-09-02 ENCOUNTER — Other Ambulatory Visit: Payer: Self-pay

## 2023-09-16 ENCOUNTER — Encounter (HOSPITAL_BASED_OUTPATIENT_CLINIC_OR_DEPARTMENT_OTHER): Payer: Self-pay | Admitting: Plastic Surgery

## 2023-09-16 ENCOUNTER — Other Ambulatory Visit: Payer: Self-pay

## 2023-09-18 DIAGNOSIS — Z719 Counseling, unspecified: Secondary | ICD-10-CM

## 2023-09-23 ENCOUNTER — Ambulatory Visit (HOSPITAL_BASED_OUTPATIENT_CLINIC_OR_DEPARTMENT_OTHER): Payer: Self-pay | Admitting: Certified Registered"

## 2023-09-23 ENCOUNTER — Encounter (HOSPITAL_BASED_OUTPATIENT_CLINIC_OR_DEPARTMENT_OTHER)
Admission: RE | Disposition: A | Discharge status: Home / Self Care | Payer: Self-pay | Source: Ambulatory Visit | Attending: Plastic Surgery

## 2023-09-23 ENCOUNTER — Ambulatory Visit (HOSPITAL_BASED_OUTPATIENT_CLINIC_OR_DEPARTMENT_OTHER): Payer: 59 | Admitting: Certified Registered"

## 2023-09-23 ENCOUNTER — Other Ambulatory Visit: Payer: Self-pay

## 2023-09-23 ENCOUNTER — Encounter (HOSPITAL_BASED_OUTPATIENT_CLINIC_OR_DEPARTMENT_OTHER): Payer: Self-pay | Admitting: Plastic Surgery

## 2023-09-23 ENCOUNTER — Ambulatory Visit (HOSPITAL_BASED_OUTPATIENT_CLINIC_OR_DEPARTMENT_OTHER)
Admission: RE | Admit: 2023-09-23 | Discharge: 2023-09-23 | Disposition: A | Discharge status: Home / Self Care | Payer: 59 | Source: Ambulatory Visit | Attending: Plastic Surgery | Admitting: Plastic Surgery

## 2023-09-23 DIAGNOSIS — N62 Hypertrophy of breast: Secondary | ICD-10-CM | POA: Insufficient documentation

## 2023-09-23 DIAGNOSIS — K219 Gastro-esophageal reflux disease without esophagitis: Secondary | ICD-10-CM | POA: Insufficient documentation

## 2023-09-23 DIAGNOSIS — Z87891 Personal history of nicotine dependence: Secondary | ICD-10-CM | POA: Diagnosis not present

## 2023-09-23 DIAGNOSIS — Z01818 Encounter for other preprocedural examination: Secondary | ICD-10-CM

## 2023-09-23 HISTORY — PX: BREAST REDUCTION SURGERY: SHX8

## 2023-09-23 LAB — POCT PREGNANCY, URINE: Preg Test, Ur: NEGATIVE

## 2023-09-23 SURGERY — MAMMOPLASTY, REDUCTION
Anesthesia: General | Site: Breast | Laterality: Bilateral

## 2023-09-23 MED ORDER — FENTANYL CITRATE (PF) 100 MCG/2ML IJ SOLN
INTRAMUSCULAR | Status: AC
  Filled 2023-09-23: qty 2

## 2023-09-23 MED ORDER — 0.9 % SODIUM CHLORIDE (POUR BTL) OPTIME
TOPICAL | Status: DC | PRN
  Administered 2023-09-23 (×3): 1000 mL

## 2023-09-23 MED ORDER — OXYCODONE HCL 5 MG PO TABS
ORAL_TABLET | ORAL | Status: AC
  Filled 2023-09-23: qty 1

## 2023-09-23 MED ORDER — KETAMINE HCL 50 MG/5ML IJ SOSY
PREFILLED_SYRINGE | INTRAMUSCULAR | Status: AC
  Filled 2023-09-23: qty 5

## 2023-09-23 MED ORDER — CELECOXIB 200 MG PO CAPS
ORAL_CAPSULE | ORAL | Status: AC
  Filled 2023-09-23: qty 1

## 2023-09-23 MED ORDER — ACETAMINOPHEN 500 MG PO TABS
1000.0000 mg | ORAL_TABLET | Freq: Once | ORAL | Status: AC
  Administered 2023-09-23: 1000 mg via ORAL

## 2023-09-23 MED ORDER — ROCURONIUM BROMIDE 100 MG/10ML IV SOLN
INTRAVENOUS | Status: DC | PRN
  Administered 2023-09-23: 50 mg via INTRAVENOUS

## 2023-09-23 MED ORDER — MIDAZOLAM HCL 5 MG/5ML IJ SOLN
INTRAMUSCULAR | Status: DC | PRN
  Administered 2023-09-23: 2 mg via INTRAVENOUS

## 2023-09-23 MED ORDER — LACTATED RINGERS IV SOLN: INTRAVENOUS | Status: DC

## 2023-09-23 MED ORDER — ONDANSETRON HCL 4 MG/2ML IJ SOLN: 4.0000 mg | Freq: Once | INTRAMUSCULAR | Status: DC | PRN

## 2023-09-23 MED ORDER — ACETAMINOPHEN 325 MG PO TABS: 325.0000 mg | ORAL_TABLET | ORAL | Status: DC | PRN

## 2023-09-23 MED ORDER — ACETAMINOPHEN 160 MG/5ML PO SOLN: 325.0000 mg | ORAL | Status: DC | PRN

## 2023-09-23 MED ORDER — CHLORHEXIDINE GLUCONATE CLOTH 2 % EX PADS: 6.0000 | MEDICATED_PAD | Freq: Once | CUTANEOUS | Status: DC

## 2023-09-23 MED ORDER — ONDANSETRON HCL 4 MG/2ML IJ SOLN
INTRAMUSCULAR | Status: DC | PRN
  Administered 2023-09-23: 4 mg via INTRAVENOUS

## 2023-09-23 MED ORDER — MEPERIDINE HCL 25 MG/ML IJ SOLN: 6.2500 mg | INTRAMUSCULAR | Status: DC | PRN

## 2023-09-23 MED ORDER — KETAMINE HCL 10 MG/ML IJ SOLN
INTRAMUSCULAR | Status: DC | PRN
  Administered 2023-09-23: 20 mg via INTRAVENOUS
  Administered 2023-09-23: 30 mg via INTRAVENOUS

## 2023-09-23 MED ORDER — CLINDAMYCIN PHOSPHATE 900 MG/50ML IV SOLN
INTRAVENOUS | Status: AC
  Filled 2023-09-23: qty 50

## 2023-09-23 MED ORDER — EPHEDRINE SULFATE (PRESSORS) 50 MG/ML IJ SOLN
INTRAMUSCULAR | Status: DC | PRN
  Administered 2023-09-23: 5 mg via INTRAVENOUS
  Administered 2023-09-23: 7.5 mg via INTRAVENOUS
  Administered 2023-09-23: 10 mg via INTRAVENOUS
  Administered 2023-09-23: 5 mg via INTRAVENOUS
  Administered 2023-09-23: 2.5 mg via INTRAVENOUS

## 2023-09-23 MED ORDER — PROPOFOL 10 MG/ML IV BOLUS
INTRAVENOUS | Status: DC | PRN
  Administered 2023-09-23: 200 mg via INTRAVENOUS

## 2023-09-23 MED ORDER — FENTANYL CITRATE (PF) 100 MCG/2ML IJ SOLN
25.0000 ug | INTRAMUSCULAR | Status: DC | PRN
  Administered 2023-09-23 (×2): 25 ug via INTRAVENOUS

## 2023-09-23 MED ORDER — ACETAMINOPHEN 500 MG PO TABS
ORAL_TABLET | ORAL | Status: AC
  Filled 2023-09-23: qty 2

## 2023-09-23 MED ORDER — OXYCODONE HCL 5 MG/5ML PO SOLN: 5.0000 mg | Freq: Once | ORAL | Status: AC | PRN

## 2023-09-23 MED ORDER — FENTANYL CITRATE (PF) 100 MCG/2ML IJ SOLN
INTRAMUSCULAR | Status: DC | PRN
  Administered 2023-09-23 (×4): 50 ug via INTRAVENOUS

## 2023-09-23 MED ORDER — OXYCODONE HCL 5 MG PO TABS
5.0000 mg | ORAL_TABLET | Freq: Once | ORAL | Status: AC | PRN
  Administered 2023-09-23: 5 mg via ORAL

## 2023-09-23 MED ORDER — DEXAMETHASONE SODIUM PHOSPHATE 10 MG/ML IJ SOLN
INTRAMUSCULAR | Status: DC | PRN
  Administered 2023-09-23: 10 mg via INTRAVENOUS

## 2023-09-23 MED ORDER — BUPIVACAINE LIPOSOME 1.3 % IJ SUSP
INTRAMUSCULAR | Status: DC | PRN
Start: 2023-09-23 — End: 2023-09-23
  Administered 2023-09-23: 20 mL

## 2023-09-23 MED ORDER — LIDOCAINE 2% (20 MG/ML) 5 ML SYRINGE
INTRAMUSCULAR | Status: DC | PRN
  Administered 2023-09-23: 80 mg via INTRAVENOUS

## 2023-09-23 MED ORDER — BUPIVACAINE HCL (PF) 0.25 % IJ SOLN
INTRAMUSCULAR | Status: DC | PRN
  Administered 2023-09-23: 20 mL

## 2023-09-23 MED ORDER — CLINDAMYCIN PHOSPHATE 900 MG/50ML IV SOLN
900.0000 mg | INTRAVENOUS | Status: AC
  Administered 2023-09-23: 900 mg via INTRAVENOUS

## 2023-09-23 MED ORDER — MIDAZOLAM HCL 2 MG/2ML IJ SOLN
INTRAMUSCULAR | Status: AC
  Filled 2023-09-23: qty 2

## 2023-09-23 MED ORDER — CELECOXIB 200 MG PO CAPS
200.0000 mg | ORAL_CAPSULE | Freq: Once | ORAL | Status: AC
  Administered 2023-09-23: 200 mg via ORAL

## 2023-09-23 MED ORDER — PROPOFOL 10 MG/ML IV BOLUS
INTRAVENOUS | Status: AC
  Filled 2023-09-23: qty 20

## 2023-09-23 MED ORDER — SUGAMMADEX SODIUM 200 MG/2ML IV SOLN
INTRAVENOUS | Status: DC | PRN
  Administered 2023-09-23: 200 mg via INTRAVENOUS

## 2023-09-23 SURGICAL SUPPLY — 57 items
ADH SKN CLS APL DERMABOND .7 (GAUZE/BANDAGES/DRESSINGS) ×4
BINDER BREAST 3XL (GAUZE/BANDAGES/DRESSINGS) IMPLANT
BINDER BREAST LRG (GAUZE/BANDAGES/DRESSINGS) IMPLANT
BINDER BREAST MEDIUM (GAUZE/BANDAGES/DRESSINGS) IMPLANT
BINDER BREAST XLRG (GAUZE/BANDAGES/DRESSINGS) IMPLANT
BINDER BREAST XXLRG (GAUZE/BANDAGES/DRESSINGS) IMPLANT
BIOPATCH RED 1 DISK 7.0 (GAUZE/BANDAGES/DRESSINGS) ×2 IMPLANT
BLADE SURG 10 STRL SS (BLADE) ×4 IMPLANT
BLADE SURG 15 STRL LF DISP TIS (BLADE) ×1 IMPLANT
BLADE SURG 15 STRL SS (BLADE) ×1
CANISTER SUCT 1200ML W/VALVE (MISCELLANEOUS) ×1 IMPLANT
DERMABOND ADVANCED .7 DNX12 (GAUZE/BANDAGES/DRESSINGS) ×2 IMPLANT
DRAIN CHANNEL 19F RND (DRAIN) ×2 IMPLANT
DRAPE IMP U-DRAPE 54X76 (DRAPES) IMPLANT
DRAPE UTILITY XL STRL (DRAPES) ×1 IMPLANT
DRSG TEGADERM 4X4.75 (GAUZE/BANDAGES/DRESSINGS) ×2 IMPLANT
ELECT BLADE 4.0 EZ CLEAN MEGAD (MISCELLANEOUS) ×1
ELECT REM PT RETURN 9FT ADLT (ELECTROSURGICAL) ×2
ELECTRODE BLDE 4.0 EZ CLN MEGD (MISCELLANEOUS) ×1 IMPLANT
ELECTRODE REM PT RTRN 9FT ADLT (ELECTROSURGICAL) ×2 IMPLANT
EVACUATOR SILICONE 100CC (DRAIN) ×2 IMPLANT
GAUZE PAD ABD 8X10 STRL (GAUZE/BANDAGES/DRESSINGS) ×2 IMPLANT
GAUZE SPONGE 2X2 STRL 8-PLY (GAUZE/BANDAGES/DRESSINGS) ×2 IMPLANT
GLOVE BIO SURGEON STRL SZ 6.5 (GLOVE) IMPLANT
GLOVE BIO SURGEON STRL SZ7.5 (GLOVE) ×1 IMPLANT
GLOVE BIO SURGEON STRL SZ8 (GLOVE) ×1 IMPLANT
GLOVE BIOGEL PI IND STRL 7.0 (GLOVE) IMPLANT
GLOVE BIOGEL PI IND STRL 8 (GLOVE) ×1 IMPLANT
GOWN STRL REUS W/ TWL LRG LVL3 (GOWN DISPOSABLE) IMPLANT
GOWN STRL REUS W/TWL LRG LVL3 (GOWN DISPOSABLE) ×2
GOWN STRL REUS W/TWL XL LVL3 (GOWN DISPOSABLE) ×1 IMPLANT
HEMOSTAT ARISTA ABSORB 3G PWDR (HEMOSTASIS) IMPLANT
HIBICLENS CHG 4% 4OZ BTL (MISCELLANEOUS) ×1 IMPLANT
MARKER SKIN DUAL TIP RULER LAB (MISCELLANEOUS) ×1 IMPLANT
NDL HYPO 25X1 1.5 SAFETY (NEEDLE) IMPLANT
NEEDLE HYPO 25X1 1.5 SAFETY (NEEDLE) ×1
NS IRRIG 1000ML POUR BTL (IV SOLUTION) ×1 IMPLANT
PACK BASIN DAY SURGERY FS (CUSTOM PROCEDURE TRAY) ×1 IMPLANT
PACK UNIVERSAL I (CUSTOM PROCEDURE TRAY) ×1 IMPLANT
PENCIL SMOKE EVACUATOR (MISCELLANEOUS) ×2 IMPLANT
PIN SAFETY STERILE (MISCELLANEOUS) IMPLANT
SLEEVE SCD COMPRESS KNEE MED (STOCKING) ×1 IMPLANT
SPONGE T-LAP 18X18 ~~LOC~~+RFID (SPONGE) ×3 IMPLANT
STAPLER VISISTAT 35W (STAPLE) ×1 IMPLANT
SUT MNCRL AB 3-0 PS2 27 (SUTURE) ×4 IMPLANT
SUT MNCRL AB 4-0 PS2 18 (SUTURE) ×4 IMPLANT
SUT MON AB 2-0 CT1 36 (SUTURE) ×1 IMPLANT
SUT MON AB 5-0 PS2 18 (SUTURE) IMPLANT
SUT SILK 2 0 SH (SUTURE) ×2 IMPLANT
SUT VIC AB 3-0 SH 27 (SUTURE)
SUT VIC AB 3-0 SH 27X BRD (SUTURE) IMPLANT
SYR BULB IRRIG 60ML STRL (SYRINGE) ×1 IMPLANT
TOWEL GREEN STERILE FF (TOWEL DISPOSABLE) ×2 IMPLANT
TRAY DSU PREP LF (CUSTOM PROCEDURE TRAY) ×1 IMPLANT
TUBE CONNECTING 20X1/4 (TUBING) ×1 IMPLANT
UNDERPAD 30X36 HEAVY ABSORB (UNDERPADS AND DIAPERS) ×2 IMPLANT
YANKAUER SUCT BULB TIP NO VENT (SUCTIONS) ×1 IMPLANT

## 2023-09-23 NOTE — Op Note (Signed)
DATE OF OPERATION: 09/23/2023  LOCATION: [facility] operating Room  PREOPERATIVE DIAGNOSIS: Symptomatic macromastia  POSTOPERATIVE DIAGNOSIS: Same  PROCEDURE: Bilateral breast reduction  SURGEON: Loren Racer, MD  ASSISTANT: Matt Scheeler  EBL: 100 cc  CONDITION: Stable  COMPLICATIONS: None  INDICATION: The patient, Marissa Gonzalez, is a 47 y.o. female born on January 18, 1976, is here for treatment upper back and neck pain and difficulty finding bras that fit appropriately secondary to large breast size.Marland Kitchen   PROCEDURE DETAILS:  The patient was seen prior to surgery and marked.  IV antibiotics were given. The patient was taken to the operating room and given a general anesthetic. A standard time out was performed and all information was confirmed by those in the room. SCDs were placed.   The chest was prepped and draped in usual sterile manner.  A 42 mm cookie cutter was used to outline the proposed nipple areolar complex and an 8 cm based pedicle was designed.  A laparotomy tape was placed at the base of breast as a tourniquet and the pedicle was de-epithelialized sharply.  The electrocautery was used to dissected the borders of the pedicle to the chest wall.  The electrocautery was then used to resect the medial lateral and superior triangles of breast tissue and to develop and thin the superior skin flap.  The breast tissue removed constituted the bulk of the reduction and weighed 1153 g.  The tissue was sent to pathology for routine examination.  The wound was inspected for bleeding and hemostasis achieved with the electrocautery.  The surgical site was irrigated with warm normal saline.  A 19 French round drain was placed behind the pedicle and brought out through a separate stab incision.  The T point was approximated with a single 2-0 Monocryl suture and the skin edges tailor tacked in place with skin clips.  The dermis was closed with a running 3-0 Monocryl suture and the skin was closed  with a running 4-0 Monocryl subcuticular stitch.  Attention was turned to the left side where similar procedure was performed.  Laparotomy tape was placed at the base of the breast as a tourniquet and the pedicle was de-epithelialized and dissected to the chest wall.  The medial lateral and superior triangles of breast tissue were resected and the superior skin flap was developed and thinned.  This constituted the bulk of the reduction in the total weight of the tissue removed was 1234 g.  The tissue was sent to pathology for routine examination.  The wound was inspected for bleeding and meticulous hemostasis was achieved with the electrocautery.  The wound was irrigated with warm normal saline.  A 19 French round drain was placed behind the pedicle and brought out through a separate stab incision.  The T point was approximated with a single 2-0 Monocryl subcuticular stitch and the skin edges tailor tacked in place with skin clips.  The dermis was closed with interrupted and running 3-0 Monocryl sutures and the skin edges were approximated with a running 4-0 Monocryl subcuticular stitch.  The skin was cleaned and the incision sealed with Dermabond.  The patient was placed in a supportive garment and awakened from anesthesia without incident.  She was transferred to the recovery room in good condition all instrument needle and sponge counts were reported as correct.  There were no complications. The patient was allowed to wake up and taken to recovery room in stable condition at the end of the case. The family was notified at the  end of the case.   The advanced practice practitioner (APP) assisted throughout the case.  The APP was essential in retraction and counter traction when needed to make the case progress smoothly.  This retraction and assistance made it possible to see the tissue plans for the procedure.  The assistance was needed for blood control, tissue re-approximation and assisted with closure of the  incision site.

## 2023-09-23 NOTE — Anesthesia Preprocedure Evaluation (Addendum)
Anesthesia Evaluation  Patient identified by MRN, date of birth, ID band Patient awake    Reviewed: Allergy & Precautions, NPO status , Patient's Chart, lab work & pertinent test results, reviewed documented beta blocker date and time   Airway Mallampati: II   Neck ROM: Full    Dental  (+) Teeth Intact, Dental Advisory Given   Pulmonary former smoker   breath sounds clear to auscultation       Cardiovascular negative cardio ROS  Rhythm:Regular     Neuro/Psych  Headaches  Anxiety      Neuromuscular disease    GI/Hepatic ,GERD  Medicated,,  Endo/Other    Renal/GU      Musculoskeletal negative musculoskeletal ROS (+)    Abdominal  (+)  Abdomen: soft.   Peds  Hematology   Anesthesia Other Findings   Reproductive/Obstetrics                             Anesthesia Physical Anesthesia Plan  ASA: 2  Anesthesia Plan: General   Post-op Pain Management: Tylenol PO (pre-op)* and Celebrex PO (pre-op)*   Induction: Intravenous  PONV Risk Score and Plan: 3 and Ondansetron, Dexamethasone and Treatment may vary due to age or medical condition  Airway Management Planned: Oral ETT and LMA  Additional Equipment: None  Intra-op Plan:   Post-operative Plan: Extubation in OR  Informed Consent: I have reviewed the patients History and Physical, chart, labs and discussed the procedure including the risks, benefits and alternatives for the proposed anesthesia with the patient or authorized representative who has indicated his/her understanding and acceptance.       Plan Discussed with: Anesthesiologist and CRNA  Anesthesia Plan Comments:         Anesthesia Quick Evaluation

## 2023-09-23 NOTE — Discharge Instructions (Addendum)
Activity As tolerated. No showers for 24 hours. Keep wrap on breasts until then. After showering, put wrap back on, this is important for compression. No driving for 24 hours or when taking pain medication or if you are unable to safely react to traffic. No heavy activities  Take Pain medication (Oxycodone) as needed for severe pain. Otherwise, you can use ibuprofen or tylenol as needed. Avoid more than 3,000 mg of tylenol in 24 hours.  You do not need an antibiotic post-operatively unless this was discussed with the provider at your pre-op appointment.  Diet: Regular. Drink plenty of fluids and eat healthy (high protein, low carbs), Try to optimize your nutrition with plenty of fruits and vegetables to improve healing. Protein shakes are a good option.  Wound Care: Keep dressing clean & dry. You may change bandages after showering if you continue to notice some drainage. You can reuse bandages if they are not dirty/soiled. Mild wound drainage is common after breast reduction surgery and should not be cause for alarm.  Monitor JP drain output.  Special Instructions: Call Doctor if any unusual problems occur such as pain, excessive Bleeding, unrelieved Nausea/vomiting, Fever &/or chills  Follow-up appointment: Previously scheduled.  May take Tylenol after 4:30pm, if needed.  May take NSAIDS (ibuprofen/motrin) after 4:30pm, if needed.    Post Anesthesia Home Care Instructions  Activity: Get plenty of rest for the remainder of the day. A responsible individual must stay with you for 24 hours following the procedure.  For the next 24 hours, DO NOT: -Drive a car -Advertising copywriter -Drink alcoholic beverages -Take any medication unless instructed by your physician -Make any legal decisions or sign important papers.  Meals: Start with liquid foods such as gelatin or soup. Progress to regular foods as tolerated. Avoid greasy, spicy, heavy foods. If nausea and/or vomiting occur, drink only  clear liquids until the nausea and/or vomiting subsides. Call your physician if vomiting continues.  Special Instructions/Symptoms: Your throat may feel dry or sore from the anesthesia or the breathing tube placed in your throat during surgery. If this causes discomfort, gargle with warm salt water. The discomfort should disappear within 24 hours.  If you had a scopolamine patch placed behind your ear for the management of post- operative nausea and/or vomiting:  1. The medication in the patch is effective for 72 hours, after which it should be removed.  Wrap patch in a tissue and discard in the trash. Wash hands thoroughly with soap and water. 2. You may remove the patch earlier than 72 hours if you experience unpleasant side effects which may include dry mouth, dizziness or visual disturbances. 3. Avoid touching the patch. Wash your hands with soap and water after contact with the patch.    Information for Discharge Teaching: EXPAREL (bupivacaine liposome injectable suspension)   Pain relief is important to your recovery. The goal is to control your pain so you can move easier and return to your normal activities as soon as possible after your procedure. Your physician may use several types of medicines to manage pain, swelling, and more.  Your surgeon or anesthesiologist gave you EXPAREL(bupivacaine) to help control your pain after surgery.  EXPAREL is a local anesthetic designed to release slowly over an extended period of time to provide pain relief by numbing the tissue around the surgical site. EXPAREL is designed to release pain medication over time and can control pain for up to 72 hours. Depending on how you respond to EXPAREL, you may require  less pain medication during your recovery. EXPAREL can help reduce or eliminate the need for opioids during the first few days after surgery when pain relief is needed the most. EXPAREL is not an opioid and is not addictive. It does not cause  sleepiness or sedation.   Important! A teal colored band has been placed on your arm with the date, time and amount of EXPAREL you have received. Please leave this armband in place for the full 96 hours following administration, and then you may remove the band. If you return to the hospital for any reason within 96 hours following the administration of EXPAREL, the armband provides important information that your health care providers to know, and alerts them that you have received this anesthetic.    Possible side effects of EXPAREL: Temporary loss of sensation or ability to move in the area where medication was injected. Nausea, vomiting, constipation Rarely, numbness and tingling in your mouth or lips, lightheadedness, or anxiety may occur. Call your doctor right away if you think you may be experiencing any of these sensations, or if you have other questions regarding possible side effects.  Follow all other discharge instructions given to you by your surgeon or nurse. Eat a healthy diet and drink plenty of water or other fluids.About my Jackson-Pratt Bulb Drain  What is a Jackson-Pratt bulb? A Jackson-Pratt is a soft, round device used to collect drainage. It is connected to a long, thin drainage catheter, which is held in place by one or two small stiches near your surgical incision site. When the bulb is squeezed, it forms a vacuum, forcing the drainage to empty into the bulb.  Emptying the Jackson-Pratt bulb- To empty the bulb: 1. Release the plug on the top of the bulb. 2. Pour the bulb's contents into a measuring container which your nurse will provide. 3. Record the time emptied and amount of drainage. Empty the drain(s) as often as your     doctor or nurse recommends.  Date                  Time                    Amount (Drain 1)                 Amount (Drain  2)  _____________________________________________________________________  _____________________________________________________________________  _____________________________________________________________________  _____________________________________________________________________  _____________________________________________________________________  _____________________________________________________________________  _____________________________________________________________________  _____________________________________________________________________  Squeezing the Jackson-Pratt Bulb- To squeeze the bulb: 1. Make sure the plug at the top of the bulb is open. 2. Squeeze the bulb tightly in your fist. You will hear air squeezing from the bulb. 3. Replace the plug while the bulb is squeezed. 4. Use a safety pin to attach the bulb to your clothing. This will keep the catheter from     pulling at the bulb insertion site.  When to call your doctor- Call your doctor if: Drain site becomes red, swollen or hot. You have a fever greater than 101 degrees F. There is oozing at the drain site. Drain falls out (apply a guaze bandage over the drain hole and secure it with tape). Drainage increases daily not related to activity patterns. (You will usually have more drainage when you are active than when you are resting.) Drainage has a bad odor.

## 2023-09-23 NOTE — Anesthesia Procedure Notes (Signed)
Procedure Name: Intubation Date/Time: 09/23/2023 1:36 PM  Performed by: Lauralyn Primes, CRNAPre-anesthesia Checklist: Patient identified, Emergency Drugs available, Suction available and Patient being monitored Patient Re-evaluated:Patient Re-evaluated prior to induction Oxygen Delivery Method: Circle system utilized Preoxygenation: Pre-oxygenation with 100% oxygen Induction Type: IV induction Ventilation: Mask ventilation without difficulty Laryngoscope Size: Mac and 3 Grade View: Grade I Tube type: Oral Tube size: 7.0 mm Number of attempts: 1 Airway Equipment and Method: Stylet and Bite block Placement Confirmation: ETT inserted through vocal cords under direct vision, positive ETCO2 and breath sounds checked- equal and bilateral Secured at: 22 cm Tube secured with: Tape Dental Injury: Teeth and Oropharynx as per pre-operative assessment

## 2023-09-23 NOTE — Transfer of Care (Signed)
Immediate Anesthesia Transfer of Care Note  Patient: Marissa Gonzalez  Procedure(s) Performed: MAMMARY REDUCTION  (BREAST) (Bilateral: Breast)  Patient Location: PACU  Anesthesia Type:General  Level of Consciousness: drowsy  Airway & Oxygen Therapy: Patient Spontanous Breathing and Patient connected to face mask oxygen  Post-op Assessment: Report given to RN and Post -op Vital signs reviewed and stable  Post vital signs: Reviewed and stable  Last Vitals:  Vitals Value Taken Time  BP 112/59 09/23/23 1654  Temp    Pulse 78 09/23/23 1656  Resp 18 09/23/23 1656  SpO2 100 % 09/23/23 1656  Vitals shown include unfiled device data.  Last Pain:  Vitals:   09/23/23 1030  TempSrc: Oral  PainSc: 0-No pain         Complications: No notable events documented.

## 2023-09-23 NOTE — Interval H&P Note (Signed)
History and Physical Interval Note: No change in exam or indication for surgery Marked for a bilateral breast reduction with her assistance. All questions answered, will proceed at her request. 09/23/2023 1:22 PM  Marissa Gonzalez  has presented today for surgery, with the diagnosis of macromastia.  The various methods of treatment have been discussed with the patient and family. After consideration of risks, benefits and other options for treatment, the patient has consented to  Procedure(s): MAMMARY REDUCTION  (BREAST) (Bilateral) as a surgical intervention.  The patient's history has been reviewed, patient examined, no change in status, stable for surgery.  I have reviewed the patient's chart and labs.  Questions were answered to the patient's satisfaction.     Santiago Glad

## 2023-09-24 ENCOUNTER — Encounter (HOSPITAL_BASED_OUTPATIENT_CLINIC_OR_DEPARTMENT_OTHER): Payer: Self-pay | Admitting: Plastic Surgery

## 2023-09-24 ENCOUNTER — Encounter: Payer: Self-pay | Admitting: Physician Assistant

## 2023-09-24 ENCOUNTER — Ambulatory Visit: Payer: 59 | Admitting: Surgical

## 2023-09-24 VITALS — BP 134/80 | HR 91 | Ht 65.0 in | Wt 187.0 lb

## 2023-09-24 DIAGNOSIS — G8929 Other chronic pain: Secondary | ICD-10-CM

## 2023-09-24 DIAGNOSIS — N62 Hypertrophy of breast: Secondary | ICD-10-CM

## 2023-09-24 DIAGNOSIS — Z9889 Other specified postprocedural states: Secondary | ICD-10-CM

## 2023-09-24 NOTE — Anesthesia Postprocedure Evaluation (Signed)
Anesthesia Post Note  Patient: Marissa Gonzalez  Procedure(s) Performed: MAMMARY REDUCTION  (BREAST) (Bilateral: Breast)     Patient location during evaluation: PACU Anesthesia Type: General Level of consciousness: awake and alert Pain management: pain level controlled Vital Signs Assessment: post-procedure vital signs reviewed and stable Respiratory status: spontaneous breathing, nonlabored ventilation, respiratory function stable and patient connected to nasal cannula oxygen Cardiovascular status: blood pressure returned to baseline and stable Postop Assessment: no apparent nausea or vomiting Anesthetic complications: no   No notable events documented.  Last Vitals:  Vitals:   09/23/23 1730 09/23/23 1759  BP: (!) 133/59 129/72  Pulse: 88 86  Resp: 12 16  Temp:  (!) 36.2 C  SpO2: 96% 96%    Last Pain:  Vitals:   09/24/23 0954  TempSrc:   PainSc: 7    Pain Goal: Patients Stated Pain Goal: 3 (09/23/23 1715)                 Brieonna Crutcher

## 2023-09-24 NOTE — Progress Notes (Signed)
Patient is a  47 y.o.-year-old female status post bilateral breast reduction with Dr.  Ladona Ridgel. Patient is 1 day postop.  Patient reports she is overall doing well, she reports she had some vomiting yesterday after surgery on the way home but has been doing well since.  She does report that she took some oxycodone prior to today's appointment and she noticed her cheeks flushing as well as her chest.  She denies any shortness of breath, tongue swelling, change in phonation.  She reports she has had Percocet in the past without any issues.  She is not having any infectious symptoms at this time.  She overall feels well.  Pain has been well-controlled.  She reports JP drains have been monitored.  They have been emptying them.  She has had approximately 30 to 35 cc of drainage  Chaperone present on exam Bilateral NAC's are viable, bilateral breast incisions are intact. There is no erythema or cellulitic changes noted. No subcutaneous fluid collections noted with palpation.  She does have some bruising and ecchymosis along the bilateral breast   Recommend continuing with compressive garment 24/7 until 6 weeks post-op,  avoiding strenuous activity/heavy lifting until 6 weeks post-op  Recommend following up in 1 week All the patient's questions were answered to their content. Recommend calling with any questions or concerns.  Pictures were obtained of the patient and placed in the chart with the patient's or guardian's permission.

## 2023-09-29 LAB — SURGICAL PATHOLOGY

## 2023-10-01 ENCOUNTER — Ambulatory Visit (INDEPENDENT_AMBULATORY_CARE_PROVIDER_SITE_OTHER): Payer: 59 | Admitting: Plastic Surgery

## 2023-10-01 VITALS — BP 117/67 | HR 94

## 2023-10-01 DIAGNOSIS — Z9889 Other specified postprocedural states: Secondary | ICD-10-CM

## 2023-10-01 NOTE — Progress Notes (Signed)
Marissa Gonzalez returns today approximately 8 days postop after a bilateral breast reduction.  She reports she is doing well with minimal drainage on her garments.  She is very happy with the size and shape of her breast.  She notes that her ecchymoses is improving daily.  She did note that she has had a change in body odor since her surgery.  On examination she has excellent size and shape.  Nipples are well-perfused.  She has ecchymoses along the inferior aspect of both breasts but this is not excessive.  All incisions are clean dry and intact.  Status post bilateral breast reduction.  Doing well pathology unremarkable.  Patient encouraged to begin scar massage at 2 weeks.  I do not have an explanation for her change in body odor however I anticipate that this will improve with time as it probably reflects a change in hormonal levels due to stress of surgery.  Return for any questions or concerns

## 2023-10-09 ENCOUNTER — Encounter: Payer: Self-pay | Admitting: Gastroenterology

## 2023-10-15 NOTE — Progress Notes (Unsigned)
Patient is a  47 y.o.-year-old female status post bilateral breast reduction with Dr.  Ladona Ridgel. Patient is 3 weeks postop.  She presents today with her husband.  She reports she is overall doing well.  She is not having any specific issues at this time.  She has been using silicone scar tape and silicone scar cream for scar healing.    Chaperone present on exam Bilateral NAC's are viable, bilateral breast incisions are intact. There is no erythema or cellulitic changes noted. No subcutaneous fluid collections noted with palpation.  She does have some irritation of bilateral breast, she has some areas where sutures are protruding through the skin and irritating her incisions.  There is no signs of infection, no active drainage noted.     Recommend continuing with compressive garment 24/7 until 6 weeks post-op,  avoiding strenuous activity/heavy lifting until 6 weeks post-op  Recommend following up in 3 weeks for reevaluation All the patient's questions were answered to their content. Recommend calling with any questions or concerns.  Pictures were obtained of the patient and placed in the chart with the patient's or guardian's permission.

## 2023-10-16 ENCOUNTER — Ambulatory Visit: Payer: 59 | Admitting: Surgical

## 2023-10-16 DIAGNOSIS — Z9889 Other specified postprocedural states: Secondary | ICD-10-CM

## 2023-11-05 ENCOUNTER — Other Ambulatory Visit: Payer: Self-pay

## 2023-11-05 ENCOUNTER — Encounter: Payer: Self-pay | Admitting: Surgical

## 2023-11-05 ENCOUNTER — Ambulatory Visit: Payer: 59 | Admitting: Surgical

## 2023-11-05 VITALS — BP 95/62 | HR 84

## 2023-11-05 DIAGNOSIS — G8929 Other chronic pain: Secondary | ICD-10-CM

## 2023-11-05 DIAGNOSIS — Z9889 Other specified postprocedural states: Secondary | ICD-10-CM

## 2023-11-05 NOTE — Progress Notes (Signed)
47 year old female here for follow-up after breast reduction with Dr. Ladona Ridgel on 09/23/2023.  She is 6 weeks postop. She is here today with her husband.  She reports overall she is doing really well.  She is not having any infectious symptoms at this time.  She is overall very happy with her breast reduction outcome and reports she feels much better.  On exam bilateral NAC's are viable, bilateral breast incisions are intact and well-healed.  There is no cellulitic changes noted.  No subcutaneous fluid collection noted.  No open wounds noted at this time.  Bilateral breasts are symmetric, soft.  She does have some mild erythema of bilateral breasts centrally, but no signs of infection or concern.  This appears more irritated or likely related to compression bra pressure.  A/P:  Patient is doing well after breast reduction 6 weeks ago with Dr. Ladona Ridgel, there is no signs infection or concern on exam.  She is well-healed.  We discussed that the incisions are healing well, recommend continue with compressive garments when active, however she can go without compressive garments at night now.  She can transition to a normal bra at about 2 months postop.  Recommend following up as needed, call with questions or concerns.

## 2023-11-17 ENCOUNTER — Other Ambulatory Visit: Payer: Self-pay

## 2023-11-24 ENCOUNTER — Ambulatory Visit (AMBULATORY_SURGERY_CENTER): Payer: 59

## 2023-11-24 VITALS — Ht 65.0 in | Wt 180.0 lb

## 2023-11-24 DIAGNOSIS — Z1211 Encounter for screening for malignant neoplasm of colon: Secondary | ICD-10-CM

## 2023-11-24 NOTE — Progress Notes (Signed)

## 2023-12-03 ENCOUNTER — Encounter: Payer: Self-pay | Admitting: Gastroenterology

## 2023-12-12 ENCOUNTER — Encounter: Payer: Self-pay | Admitting: Gastroenterology

## 2023-12-12 ENCOUNTER — Ambulatory Visit (AMBULATORY_SURGERY_CENTER): Payer: 59 | Admitting: Gastroenterology

## 2023-12-12 VITALS — BP 109/61 | HR 62 | Temp 97.9°F | Resp 10 | Ht 65.0 in | Wt 180.0 lb

## 2023-12-12 DIAGNOSIS — Z1211 Encounter for screening for malignant neoplasm of colon: Secondary | ICD-10-CM | POA: Diagnosis not present

## 2023-12-12 DIAGNOSIS — F419 Anxiety disorder, unspecified: Secondary | ICD-10-CM | POA: Diagnosis not present

## 2023-12-12 DIAGNOSIS — K6389 Other specified diseases of intestine: Secondary | ICD-10-CM | POA: Diagnosis not present

## 2023-12-12 DIAGNOSIS — K648 Other hemorrhoids: Secondary | ICD-10-CM

## 2023-12-12 DIAGNOSIS — D122 Benign neoplasm of ascending colon: Secondary | ICD-10-CM

## 2023-12-12 DIAGNOSIS — K635 Polyp of colon: Secondary | ICD-10-CM | POA: Diagnosis not present

## 2023-12-12 MED ORDER — SODIUM CHLORIDE 0.9 % IV SOLN: 500.0000 mL | Freq: Once | INTRAVENOUS | Status: DC

## 2023-12-12 NOTE — Progress Notes (Signed)
Pt's states no medical or surgical changes since previsit or office visit. 

## 2023-12-12 NOTE — Op Note (Signed)
[facility] Patient Name: Marissa Gonzalez Procedure Date: 12/12/2023 11:04 AM MRN: [id number] Endoscopist: Viviann Spare P. Adela Lank , MD, [id number] Age: 47 Referring MD:  Date of Birth: 1976-12-18 Gender: Female Account #: [id number] Procedure:                Colonoscopy Indications:              Screening for colorectal malignant neoplasm, This                            is the patient's first colonoscopy Medicines:                Monitored Anesthesia Care Procedure:                Pre-Anesthesia Assessment:                           - Prior to the procedure, a History and Physical                            was performed, and patient medications and                            allergies were reviewed. The patient's tolerance of                            previous anesthesia was also reviewed. The risks                            and benefits of the procedure and the sedation                            options and risks were discussed with the patient.                            All questions were answered, and informed consent                            was obtained. Prior Anticoagulants: The patient has                            taken no anticoagulant or antiplatelet agents. ASA                            Grade Assessment: II - A patient with mild systemic                            disease. After reviewing the risks and benefits,                            the patient was deemed in satisfactory condition to                            undergo the procedure.  After obtaining informed consent, the colonoscope                            was passed under direct vision. Throughout the                            procedure, the patient's blood pressure, pulse, and                            oxygen saturations were monitored continuously. The                            Olympus Scope [id number] was introduced through the                            anus  and advanced to the the cecum, identified by                            appendiceal orifice and ileocecal valve. The                            colonoscopy was performed without difficulty. The                            patient tolerated the procedure well. The quality                            of the bowel preparation was adequate. The                            ileocecal valve, appendiceal orifice, and rectum                            were photographed. Scope In: 11:20:00 AM Scope Out: 11:35:09 AM Scope Withdrawal Time: 0 hours 11 minutes 58 seconds  Total Procedure Duration: 0 hours 15 minutes 9 seconds  Findings:                 The perianal and digital rectal examinations were                            normal.                           A diminutive polyp was found in the cecum. The                            polyp was flat. The polyp was removed with a cold                            snare. Resection and retrieval were complete.                           Internal hemorrhoids were found during  retroflexion. The hemorrhoids were small.                           The exam was otherwise without abnormality. Complications:            No immediate complications. Estimated blood loss:                            Minimal. Estimated Blood Loss:     Estimated blood loss was minimal. Impression:               - One diminutive polyp in the cecum, removed with a                            cold snare. Resected and retrieved.                           - Internal hemorrhoids.                           - The examination was otherwise normal. Recommendation:           - Patient has a contact number available for                            emergencies. The signs and symptoms of potential                            delayed complications were discussed with the                            patient. Return to normal activities tomorrow.                            Written  discharge instructions were provided to the                            patient.                           - Resume previous diet.                           - Continue present medications.                           - Await pathology results. Viviann Spare P. Jayona Mccaig, MD 12/12/2023 11:38:52 AM This report has been signed electronically.

## 2023-12-12 NOTE — Progress Notes (Signed)
Sedate, gd SR, tolerated procedure well, VSS, report to RN 

## 2023-12-12 NOTE — Patient Instructions (Addendum)
Recommendation:           - Patient has a contact number available for                            emergencies. The signs and symptoms of potential                            delayed complications were discussed with the                            patient. Return to normal activities tomorrow.                            Written discharge instructions were provided to the                            patient.                           - Resume previous diet.                           - Continue present medications.                           - Await pathology results.  YOU HAD AN ENDOSCOPIC PROCEDURE TODAY AT THE [facility]:   Refer to the procedure report that was given to you for any specific questions about what was found during the examination.  If the procedure report does not answer your questions, please call your gastroenterologist to clarify.  If you requested that your care partner not be given the details of your procedure findings, then the procedure report has been included in a sealed envelope for you to review at your convenience later.  YOU SHOULD EXPECT: Some feelings of bloating in the abdomen. Passage of more gas than usual.  Walking can help get rid of the air that was put into your GI tract during the procedure and reduce the bloating. If you had a lower endoscopy (such as a colonoscopy or flexible sigmoidoscopy) you may notice spotting of blood in your stool or on the toilet paper. If you underwent a bowel prep for your procedure, you may not have a normal bowel movement for a few days.  Please Note:  You might notice some irritation and congestion in your nose or some drainage.  This is from the oxygen used during your procedure.  There is no need for concern and it should clear up in a day or so.  SYMPTOMS TO REPORT IMMEDIATELY:  Following lower endoscopy (colonoscopy or flexible sigmoidoscopy):  Excessive amounts of blood in the stool  Significant tenderness or  worsening of abdominal pains  Swelling of the abdomen that is new, acute  Fever of 100F or higher  For urgent or emergent issues, a gastroenterologist can be reached at any hour by calling [phone]. Do not use MyChart messaging for urgent concerns.    DIET:  We do recommend a small meal at first, but then you may proceed to your regular diet.  Drink plenty of fluids but you should avoid alcoholic beverages for 24 hours.  ACTIVITY:  You should plan to take it easy for the rest of today and you should NOT DRIVE or use heavy machinery until tomorrow (because of the sedation medicines used during the test).    FOLLOW UP: Our staff will call the number listed on your records the next business day following your procedure.  We will call around 7:15- 8:00 am to check on you and address any questions or concerns that you may have regarding the information given to you following your procedure. If we do not reach you, we will leave a message.     If any biopsies were taken you will be contacted by phone or by letter within the next 1-3 weeks.  Please call us at [phone] if you have not heard about the biopsies in 3 weeks.    SIGNATURES/CONFIDENTIALITY: You and/or your care partner have signed paperwork which will be entered into your electronic medical record.  These signatures attest to the fact that that the information above on your After Visit Summary has been reviewed and is understood.  Full responsibility of the confidentiality of this discharge information lies with you and/or your care-partner.

## 2023-12-12 NOTE — Progress Notes (Signed)
Called to room to assist during endoscopic procedure.  Patient ID and intended procedure confirmed with present staff. Received instructions for my participation in the procedure from the performing physician.  

## 2023-12-12 NOTE — Progress Notes (Signed)
[facility] History and Physical   Primary Care Physician:  Marissa Gonzalez, [place]   Reason for Procedure:   Colon cancer screening  Plan:    colonoscopy     HPI: Marissa Gonzalez is a 47 y.o. female  here for colonoscopy screening - first time exam.   Patient denies any bowel symptoms at this time. No family history of colon cancer known. Otherwise feels well without any cardiopulmonary symptoms.   I have discussed risks / benefits of anesthesia and endoscopic procedure with Marissa Gonzalez and they wish to proceed with the exams as outlined today.    Past Medical History:  Diagnosis Date   Abnormal Pap smear    Anxiety    Fibroid 2012   this pregnancy   GERD (gastroesophageal reflux disease)    pregnancy related   Headache(784.0)    Heart murmur    no problems   Hx of migraines    as teenager   Lactose intolerance    Sciatic nerve pain 2012   this pregnancy    Past Surgical History:  Procedure Laterality Date   BREAST REDUCTION SURGERY Bilateral 09/23/2023   Procedure: MAMMARY REDUCTION  (BREAST);  Surgeon: Marissa Glad, MD;  Location:  SURGERY CENTER;  Service: Plastics;  Laterality: Bilateral;   CESAREAN SECTION  03/10/2012   Procedure: CESAREAN SECTION;  Surgeon: Marissa Hawking, MD;x 1  Location: [facility];  Service: Gynecology;  Laterality: N/A;   CESAREAN SECTION  08/08/2015   CESAREAN SECTION WITH BILATERAL TUBAL LIGATION Bilateral 08/08/2015   Procedure: CESAREAN SECTION WITH Left Partial Salpingectomy, and Right Ligation of Fallopian tube with Filshie Clip.;  Surgeon: Marissa Cairo, MD;  Location: [facility];  Service: Obstetrics;  Laterality: Bilateral;  edc 08/12/15 Marissa H. RNFA confirmed 7/29...tms    dental implant  1995   TUBAL LIGATION  08/08/2015   WISDOM TOOTH EXTRACTION  1985    Prior to Admission medications   Medication Sig Start Date End Date Taking? Authorizing Provider  buPROPion (WELLBUTRIN XL) 150 MG 24  hr tablet Take 1 tablet (150 mg total) by mouth daily. 08/11/23  Yes Marissa Motto, PA  cetirizine (ZYRTEC) 10 MG tablet Take 10 mg by mouth daily.   Yes [provider]  citalopram (CELEXA) 20 MG tablet Take 1 tablet (20 mg total) by mouth daily. 08/18/23  Yes Marissa Motto, PA  LORazepam (ATIVAN) 0.5 MG tablet Take 1 tablet (0.5 mg total) by mouth as needed for anxiety. 08/11/23  Yes Marissa Motto, PA  minoxidil (LONITEN) 2.5 MG tablet Take by mouth daily. Take 1.25mg  by mouth daily.   Yes [provider]  Multiple Vitamins-Minerals (HAIR SKIN AND NAILS FORMULA) TABS Take 2 each by mouth daily.   Yes [provider]  levonorgestrel (MIRENA, 52 MG,) 20 MCG/DAY IUD 1 each by Intrauterine route. 08/29/22   [provider]  ondansetron (ZOFRAN) 4 MG tablet Take 1 tablet (4 mg total) by mouth every 8 (eight) hours as needed for nausea or vomiting. Patient not taking: Reported on 11/24/2023 09/01/23   Marissa Gonzalez, Marissa Balo, PA-C    Current Outpatient Medications  Medication Sig Dispense Refill   buPROPion (WELLBUTRIN XL) 150 MG 24 hr tablet Take 1 tablet (150 mg total) by mouth daily. 90 tablet 1   cetirizine (ZYRTEC) 10 MG tablet Take 10 mg by mouth daily.     citalopram (CELEXA) 20 MG tablet Take 1 tablet (20 mg total) by mouth daily. 90 tablet 1  LORazepam (ATIVAN) 0.5 MG tablet Take 1 tablet (0.5 mg total) by mouth as needed for anxiety. 30 tablet 1   minoxidil (LONITEN) 2.5 MG tablet Take by mouth daily. Take 1.25mg  by mouth daily.     Multiple Vitamins-Minerals (HAIR SKIN AND NAILS FORMULA) TABS Take 2 each by mouth daily.     levonorgestrel (MIRENA, 52 MG,) 20 MCG/DAY IUD 1 each by Intrauterine route.     ondansetron (ZOFRAN) 4 MG tablet Take 1 tablet (4 mg total) by mouth every 8 (eight) hours as needed for nausea or vomiting. (Patient not taking: Reported on 11/24/2023) 20 tablet 0   Current Facility-Administered Medications  Medication Dose Route  Frequency Provider Last Rate Last Admin   0.9 %  sodium chloride infusion  500 mL Intravenous Once Marissa Gonzalez, Willaim Rayas, MD        Allergies as of 12/12/2023 - Review Complete 12/12/2023  Allergen Reaction Noted   Augmentin [amoxicillin-pot clavulanate]  01/27/2018   Fluticasone Other (See Comments) 10/07/2022   Sulfa antibiotics  01/27/2018   Sulfamethoxazole-trimethoprim  11/24/2023    Family History  Problem Relation Age of Onset   Depression Mother        per prenatal   Hypertension Mother    Depression Father        per prenatal   Hearing loss Father    Hypertension Father    Hyperlipidemia Father    Depression Brother    Cancer Maternal Grandmother    Arthritis Maternal Grandmother    Miscarriages / Stillbirths Maternal Grandmother    Stroke Maternal Grandmother    Heart attack Maternal Grandfather    Heart disease Maternal Grandfather    Hypertension Maternal Grandfather    Heart attack Paternal Grandfather    Heart disease Paternal Grandfather    Colon cancer Neg Hx    Colon polyps Neg Hx    Esophageal cancer Neg Hx    Rectal cancer Neg Hx    Stomach cancer Neg Hx     Social History   Socioeconomic History   Marital status: Married    Spouse name: Not on file   Number of children: Not on file   Years of education: Not on file   Highest education level: Not on file  Occupational History   Not on file  Tobacco Use   Smoking status: Former    Current packs/day: 0.00    Types: Cigarettes    Quit date: 11/13/1999    Years since quitting: 24.0   Smokeless tobacco: Never  Vaping Use   Vaping status: Never Used  Substance and Sexual Activity   Alcohol use: Yes    Comment: 2 drinks per weekend beer and liquor   Drug use: Yes    Types: Marijuana    Comment: couple times a month   Sexual activity: Yes    Birth control/protection: Surgical    Comment: tubal ligation  Other Topics Concern   Not on file  Social History Narrative   Marissa Gonzalez -- therapist  with [employer]   47 y/o [place]   47 y/o [place]   Social Drivers of Corporate investment banker Strain: Not on [employer] Insecurity: Not on file  Transportation Needs: Not on file  Physical Activity: Not on file  Stress: Not on file  Social Connections: Not on file  Intimate Partner Violence: Not on file    Review of Systems: All other review of systems negative except as mentioned in the HPI.  Physical Exam:  Vital signs BP (!) 143/86   Pulse 67   Temp 97.9 F (36.6 C)   Ht 5\' 5"  (1.651 m)   Wt 180 lb (81.6 kg)   SpO2 99%   BMI 29.95 kg/m   General:   Alert,  Well-developed, pleasant and cooperative in NAD Lungs:  Clear throughout to auscultation.   Heart:  Regular rate and rhythm Abdomen:  Soft, nontender and nondistended.   Neuro/Psych:  Alert and cooperative. Normal mood and affect. A and O x 3  Harlin Rain, MD [facility]

## 2023-12-15 ENCOUNTER — Telehealth: Payer: Self-pay | Admitting: *Deleted

## 2023-12-15 NOTE — Telephone Encounter (Signed)
Attempted post procedure follow up call.  No answer -LVM.

## 2023-12-16 LAB — SURGICAL PATHOLOGY

## 2023-12-19 ENCOUNTER — Encounter: Payer: Self-pay | Admitting: Gastroenterology

## 2024-01-06 DIAGNOSIS — Z30431 Encounter for routine checking of intrauterine contraceptive device: Secondary | ICD-10-CM | POA: Diagnosis not present

## 2024-01-06 DIAGNOSIS — N926 Irregular menstruation, unspecified: Secondary | ICD-10-CM | POA: Diagnosis not present

## 2024-01-06 DIAGNOSIS — N760 Acute vaginitis: Secondary | ICD-10-CM | POA: Diagnosis not present

## 2024-02-04 ENCOUNTER — Other Ambulatory Visit: Payer: Self-pay | Admitting: Physician Assistant

## 2024-02-04 ENCOUNTER — Other Ambulatory Visit (HOSPITAL_COMMUNITY): Payer: Self-pay

## 2024-02-04 ENCOUNTER — Other Ambulatory Visit: Payer: Self-pay

## 2024-02-04 MED ORDER — BUPROPION HCL ER (XL) 150 MG PO TB24
150.0000 mg | ORAL_TABLET | Freq: Every day | ORAL | 1 refills | Status: DC
  Filled 2024-02-04: qty 90, 90d supply, fill #0
  Filled 2024-05-10: qty 90, 90d supply, fill #1

## 2024-02-18 ENCOUNTER — Other Ambulatory Visit: Payer: Self-pay | Admitting: Physician Assistant

## 2024-02-18 ENCOUNTER — Other Ambulatory Visit (HOSPITAL_COMMUNITY): Payer: Self-pay

## 2024-02-18 ENCOUNTER — Other Ambulatory Visit: Payer: Self-pay

## 2024-02-18 MED ORDER — CITALOPRAM HYDROBROMIDE 20 MG PO TABS
20.0000 mg | ORAL_TABLET | Freq: Every day | ORAL | 1 refills | Status: DC
  Filled 2024-02-18: qty 90, 90d supply, fill #0
  Filled 2024-05-24 – 2024-06-04 (×2): qty 90, 90d supply, fill #1

## 2024-04-08 DIAGNOSIS — L814 Other melanin hyperpigmentation: Secondary | ICD-10-CM | POA: Diagnosis not present

## 2024-04-08 DIAGNOSIS — Q825 Congenital non-neoplastic nevus: Secondary | ICD-10-CM | POA: Diagnosis not present

## 2024-04-08 DIAGNOSIS — L821 Other seborrheic keratosis: Secondary | ICD-10-CM | POA: Diagnosis not present

## 2024-04-08 DIAGNOSIS — D239 Other benign neoplasm of skin, unspecified: Secondary | ICD-10-CM | POA: Diagnosis not present

## 2024-04-08 DIAGNOSIS — D229 Melanocytic nevi, unspecified: Secondary | ICD-10-CM | POA: Diagnosis not present

## 2024-05-10 ENCOUNTER — Other Ambulatory Visit (HOSPITAL_COMMUNITY): Payer: Self-pay

## 2024-05-24 ENCOUNTER — Other Ambulatory Visit (HOSPITAL_COMMUNITY): Payer: Self-pay

## 2024-06-03 ENCOUNTER — Other Ambulatory Visit (HOSPITAL_COMMUNITY): Payer: Self-pay

## 2024-06-04 ENCOUNTER — Other Ambulatory Visit (HOSPITAL_COMMUNITY): Payer: Self-pay

## 2024-08-13 ENCOUNTER — Encounter: Payer: 59 | Admitting: Physician Assistant

## 2024-08-18 ENCOUNTER — Other Ambulatory Visit: Payer: Self-pay | Admitting: Physician Assistant

## 2024-08-19 ENCOUNTER — Other Ambulatory Visit (HOSPITAL_COMMUNITY): Payer: Self-pay

## 2024-08-20 ENCOUNTER — Telehealth (HOSPITAL_COMMUNITY): Payer: Self-pay | Admitting: Pharmacy Technician

## 2024-08-20 ENCOUNTER — Telehealth (HOSPITAL_COMMUNITY): Payer: Self-pay

## 2024-08-20 ENCOUNTER — Other Ambulatory Visit: Payer: Self-pay

## 2024-08-20 ENCOUNTER — Encounter: Payer: Self-pay | Admitting: Physician Assistant

## 2024-08-20 ENCOUNTER — Other Ambulatory Visit (HOSPITAL_COMMUNITY): Payer: Self-pay

## 2024-08-20 ENCOUNTER — Ambulatory Visit (INDEPENDENT_AMBULATORY_CARE_PROVIDER_SITE_OTHER): Admitting: Physician Assistant

## 2024-08-20 ENCOUNTER — Ambulatory Visit: Payer: Self-pay | Admitting: Physician Assistant

## 2024-08-20 VITALS — BP 102/70 | HR 79 | Temp 97.7°F | Ht 65.0 in | Wt 184.5 lb

## 2024-08-20 DIAGNOSIS — F419 Anxiety disorder, unspecified: Secondary | ICD-10-CM | POA: Diagnosis not present

## 2024-08-20 DIAGNOSIS — E669 Obesity, unspecified: Secondary | ICD-10-CM

## 2024-08-20 DIAGNOSIS — N951 Menopausal and female climacteric states: Secondary | ICD-10-CM | POA: Diagnosis not present

## 2024-08-20 DIAGNOSIS — F32A Depression, unspecified: Secondary | ICD-10-CM | POA: Diagnosis not present

## 2024-08-20 DIAGNOSIS — E7800 Pure hypercholesterolemia, unspecified: Secondary | ICD-10-CM | POA: Diagnosis not present

## 2024-08-20 DIAGNOSIS — Z0000 Encounter for general adult medical examination without abnormal findings: Secondary | ICD-10-CM

## 2024-08-20 LAB — LIPID PANEL
Cholesterol: 213 mg/dL — ABNORMAL HIGH (ref 0–200)
HDL: 78.9 mg/dL (ref >39.00)
LDL Cholesterol: 126 mg/dL — ABNORMAL HIGH (ref 0–99)
NonHDL: 134.18
Total CHOL/HDL Ratio: 3
Triglycerides: 43 mg/dL (ref 0.0–149.0)
VLDL: 8.6 mg/dL (ref 0.0–40.0)

## 2024-08-20 LAB — COMPREHENSIVE METABOLIC PANEL WITH GFR
ALT: 27 U/L (ref 0–35)
AST: 21 U/L (ref 0–37)
Albumin: 4.3 g/dL (ref 3.5–5.2)
Alkaline Phosphatase: 45 U/L (ref 39–117)
BUN: 19 mg/dL (ref 6–23)
CO2: 25 meq/L (ref 19–32)
Calcium: 9.2 mg/dL (ref 8.4–10.5)
Chloride: 103 meq/L (ref 96–112)
Creatinine, Ser: 0.73 mg/dL (ref 0.40–1.20)
GFR: 98.59 mL/min (ref >60.00)
Glucose, Bld: 79 mg/dL (ref 70–99)
Potassium: 4.3 meq/L (ref 3.5–5.1)
Sodium: 138 meq/L (ref 135–145)
Total Bilirubin: 0.4 mg/dL (ref 0.2–1.2)
Total Protein: 7.4 g/dL (ref 6.0–8.3)

## 2024-08-20 LAB — CBC WITH DIFFERENTIAL/PLATELET
Basophils Absolute: 0 K/uL (ref 0.0–0.1)
Basophils Relative: 0.4 % (ref 0.0–3.0)
Eosinophils Absolute: 0 K/uL (ref 0.0–0.7)
Eosinophils Relative: 0.5 % (ref 0.0–5.0)
HCT: 44.2 % (ref 36.0–46.0)
Hemoglobin: 14.2 g/dL (ref 12.0–15.0)
Lymphocytes Relative: 32.4 % (ref 12.0–46.0)
Lymphs Abs: 1.9 K/uL (ref 0.7–4.0)
MCHC: 32 g/dL (ref 30.0–36.0)
MCV: 84.3 fl (ref 78.0–100.0)
Monocytes Absolute: 0.5 K/uL (ref 0.1–1.0)
Monocytes Relative: 9.1 % (ref 3.0–12.0)
Neutro Abs: 3.4 K/uL (ref 1.4–7.7)
Neutrophils Relative %: 57.6 % (ref 43.0–77.0)
Platelets: 237 K/uL (ref 150.0–400.0)
RBC: 5.25 Mil/uL — ABNORMAL HIGH (ref 3.87–5.11)
RDW: 13.5 % (ref 11.5–15.5)
WBC: 5.8 K/uL (ref 4.0–10.5)

## 2024-08-20 MED ORDER — LORAZEPAM 0.5 MG PO TABS
0.5000 mg | ORAL_TABLET | ORAL | 1 refills | Status: AC | PRN
  Filled 2024-08-20: qty 30, 30d supply, fill #0

## 2024-08-20 MED ORDER — CONTRAVE 8-90 MG PO TB12
ORAL_TABLET | ORAL | 1 refills | Status: DC
Start: 2024-08-20 — End: 2024-08-24
  Filled 2024-08-20 (×2): qty 120, 30d supply, fill #0

## 2024-08-20 MED ORDER — CITALOPRAM HYDROBROMIDE 20 MG PO TABS
20.0000 mg | ORAL_TABLET | Freq: Every day | ORAL | 1 refills | Status: AC
  Filled 2024-08-20: qty 90, 90d supply, fill #0
  Filled 2024-11-29: qty 90, 90d supply, fill #1

## 2024-08-20 MED ORDER — BUPROPION HCL ER (XL) 150 MG PO TB24
150.0000 mg | ORAL_TABLET | Freq: Every day | ORAL | 1 refills | Status: AC
  Filled 2024-08-20: qty 90, 90d supply, fill #0

## 2024-08-20 NOTE — Telephone Encounter (Signed)
 PA request has been Received. New Encounter has been or will be created for follow up. For additional info see Pharmacy Prior Auth telephone encounter from 08/20/24.

## 2024-08-20 NOTE — Telephone Encounter (Signed)
 Pharmacy Patient Advocate Encounter   Received notification from Pt Calls Messages that prior authorization for Contrave  8-90MG  er tablets  is required/requested.   Insurance verification completed.   The patient is insured through [employer] .   Per test claim: PA required; PA submitted to above mentioned insurance via Latent Key/confirmation #/EOC [id number] Status is pending

## 2024-08-20 NOTE — Progress Notes (Signed)
 Subjective:    Marissa Gonzalez is a 48 y.o. female and is here for a comprehensive physical exam.  HPI  There are no preventive care reminders to display for this patient.  Discussed the use of AI scribe software for clinical note transcription with the patient, who gave verbal consent to proceed.  History of Present Illness Marissa Gonzalez is a 48 year old female who presents for follow-up after breast reduction surgery and to discuss weight management.  She underwent breast reduction surgery, reducing from a 82F to a 38C, with three pounds removed from each side. Her neck pain has improved, and her shoulders appear normal. She is satisfied with the surgical outcome. She lost ten pounds prior to the procedure and six pounds during, but is currently struggling to lose an additional fifteen pounds, remaining at 180 pounds.  She is taking Wellbutrin  150 mg daily and celexa  20 mg daily, started six weeks before surgery, which has helped maintain her mood. She occasionally uses lorazepam . She experiences night sweats, hot flashes, and difficulty sleeping. An IUD complicates menstrual tracking, but she has noticed recent spotting. 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  She consumes alcohol occasionally at social events and participates in Pilates twice a week. She is considering dietary changes to aid in weight loss and experiences sugar cravings, possibly related to perimenopausal changes.    Health Maintenance: Immunizations -- utd Colonoscopy -- utd Mammogram -- utd PAP -- utd Bone Density -- N/A Diet -- variable Exercise -- regular  Sleep habits -- no  major concerns Mood -- stable  UTD with dentist? - yes UTD with eye doctor? - yes  Weight history: Wt Readings from Last 10 Encounters:  08/20/24 184 lb 8 oz (83.7 kg)  12/12/23 180 lb (81.6 kg)  11/24/23 180 lb (81.6 kg)  09/24/23 187 lb  (84.8 kg)  09/23/23 191 lb 12.8 oz (87 kg)  09/01/23 198 lb (89.8 kg)  08/11/23 198 lb 4 oz (89.9 kg)  06/02/23 199 lb (90.3 kg)  03/31/23 195 lb (88.5 kg)  08/09/22 190 lb 8 oz (86.4 kg)   Body mass index is 30.7 kg/m. No LMP recorded. (Menstrual status: IUD).  Alcohol use:  reports current alcohol use.  Tobacco use:  Tobacco Use: Medium Risk (08/20/2024)   Patient History    Smoking Tobacco Use: Former    Smokeless Tobacco Use: Never    Passive Exposure: Not on file   Eligible for lung cancer screening? yes     08/11/2023    1:03 PM  Depression screen PHQ 2/9  Decreased Interest 1  Down, Depressed, Hopeless 1  PHQ - 2 Score 2  Altered sleeping 1  Tired, decreased energy 1  Change in appetite 2  Feeling bad or failure about yourself  0  Trouble concentrating 2  Moving slowly or fidgety/restless 2  Suicidal thoughts 2  PHQ-9 Score 12  Difficult doing work/chores Somewhat difficult     Other providers/specialists: Patient Care Team: Job Lukes, [place] as PCP - General (Physician Assistant)    PMHx, SurgHx, SocialHx, Medications, and Allergies were reviewed in the Visit Navigator and updated as appropriate.   Past Medical History:  Diagnosis Date   Abnormal Pap smear    Anxiety    Fibroid 2012   this pregnancy   GERD (gastroesophageal reflux disease)    pregnancy related   Headache(784.0)    Heart murmur  no problems   Hx of migraines    as teenager   Lactose intolerance    Sciatic nerve pain 2012   this pregnancy     Past Surgical History:  Procedure Laterality Date   BREAST REDUCTION SURGERY Bilateral 09/23/2023   Procedure: MAMMARY REDUCTION  (BREAST);  Surgeon: Waddell Leonce NOVAK, MD;  Location: [facility];  Service: Plastics;  Laterality: Bilateral;   CESAREAN SECTION  03/10/2012   Procedure: CESAREAN SECTION;  Surgeon: Rosaline LITTIE Cobble, MD;x 1  Location: [facility];  Service: Gynecology;  Laterality: N/A;   CESAREAN SECTION   08/08/2015   CESAREAN SECTION WITH BILATERAL TUBAL LIGATION Bilateral 08/08/2015   Procedure: CESAREAN SECTION WITH Left Partial Salpingectomy, and Right Ligation of Fallopian tube with Filshie Clip.;  Surgeon: Truman Corona, MD;  Location: [facility];  Service: Obstetrics;  Laterality: Bilateral;  edc 08/12/15 [name] RNFA confirmed 7/29...tms    dental implant  1995   TUBAL LIGATION  08/08/2015   WISDOM TOOTH EXTRACTION  1985     Family History  Problem Relation Age of Onset   Depression Mother        per prenatal   Hypertension Mother    Depression Father        per prenatal   Hearing loss Father    Hypertension Father    Hyperlipidemia Father    Depression Brother    Cancer Maternal Grandmother    Arthritis Maternal Grandmother    Miscarriages / Stillbirths Maternal Grandmother    Stroke Maternal Grandmother    Heart attack Maternal Grandfather    Heart disease Maternal Grandfather    Hypertension Maternal Grandfather    Heart attack Paternal Grandfather    Heart disease Paternal Grandfather    Colon cancer Neg Hx    Colon polyps Neg Hx    Esophageal cancer Neg Hx    Rectal cancer Neg Hx    Stomach cancer Neg Hx     Social History   Tobacco Use   Smoking status: Former    Current packs/day: 0.00    Types: Cigarettes    Quit date: 11/13/1999    Years since quitting: 24.7   Smokeless tobacco: Never  Vaping Use   Vaping status: Never Used  Substance Use Topics   Alcohol use: Yes    Comment: 2 drinks per weekend beer and liquor   Drug use: Yes    Types: Marijuana    Comment: couple times a month    Review of Systems:   Review of Systems  Constitutional:  Negative for chills, fever, malaise/fatigue and weight loss.  HENT:  Negative for hearing loss, sinus pain and sore throat.   Respiratory:  Negative for cough and hemoptysis.   Cardiovascular:  Negative for chest pain, palpitations, leg swelling and PND.  Gastrointestinal:  Negative for abdominal pain,  constipation, diarrhea, heartburn, nausea and vomiting.  Genitourinary:  Negative for dysuria, frequency and urgency.  Musculoskeletal:  Negative for back pain, myalgias and neck pain.  Skin:  Negative for itching and rash.  Neurological:  Negative for dizziness, tingling, seizures and headaches.  Endo/Heme/Allergies:  Negative for polydipsia.  Psychiatric/Behavioral:  Negative for depression. The patient is not nervous/anxious.     Objective:   BP 102/70 (BP Location: Left Arm, Patient Position: Sitting, Cuff Size: Large)   Pulse 79   Temp 97.7 F (36.5 C) (Temporal)   Ht 5' 5 (1.651 m)   Wt 184 lb 8 oz (83.7 kg)   SpO2  99%   BMI 30.70 kg/m  Body mass index is 30.7 kg/m.   General Appearance:    Alert, cooperative, no distress, appears stated age  Head:    Normocephalic, without obvious abnormality, atraumatic  Eyes:    PERRL, conjunctiva/corneas clear, EOM's intact, fundi    benign, both eyes  Ears:    Normal TM's and external ear canals, both ears  Nose:   Nares normal, septum midline, mucosa normal, no drainage    or sinus tenderness  Throat:   Lips, mucosa, and tongue normal; teeth and gums normal  Neck:   Supple, symmetrical, trachea midline, no adenopathy;    thyroid :  no enlargement/tenderness/nodules; no carotid   bruit or JVD  Back:     Symmetric, no curvature, ROM normal, no CVA tenderness  Lungs:     Clear to auscultation bilaterally, respirations unlabored  Chest Wall:    No tenderness or deformity   Heart:    Regular rate and rhythm, S1 and S2 normal, no murmur, rub or gallop  Breast Exam:    Deferred  Abdomen:     Soft, non-tender, bowel sounds active all four quadrants,    no masses, no organomegaly  Genitalia:    Deferred  Extremities:   Extremities normal, atraumatic, no cyanosis or edema  Pulses:   2+ and symmetric all extremities  Skin:   Skin color, texture, turgor normal, no rashes or lesions  Lymph nodes:   Cervical, supraclavicular, and axillary  nodes normal  Neurologic:   CNII-XII intact, normal strength, sensation and reflexes    throughout    Assessment/Plan:   Assessment and Plan Assessment & Plan General Health Maintenance Today patient counseled on age appropriate routine health concerns for screening and prevention, each reviewed and up to date or declined. Immunizations reviewed and up to date or declined. Labs ordered and reviewed. Risk factors for depression reviewed and negative. Hearing function and visual acuity are intact. ADLs screened and addressed as needed. Functional ability and level of safety reviewed and appropriate. Education, counseling and referrals performed based on assessed risks today. Patient provided with a copy of personalized plan for preventive services.  Obesity She expressed interest in Contrave  for weight management and was hesitant about injectable options due to cost. - Send prescription for Contrave  to [facility] pharmacy and check the price. If more than $99, send to [facility] pharmacy. - Instruct to take Contrave  with current Wellbutrin  for one week, then discontinue Wellbutrin . - Encouraged research on Contrave  and consider her preferences.  Anxiety and Depression Well-managed on current Wellbutrin  dose with occasional agitation. Rare lorazepam  use.  Anxiety disorder Minimal lorazepam  use indicates good control of anxiety symptoms.  Perimenopausal symptoms Experiencing night sweats, hot flashes, and difficulty sleeping. Considering HRT and plans to discuss with gynecologist. - Discuss hormone levels and HRT options with gynecologist. - Consider consulting with a gynecologist specializing in perimenopause and menopause.  HLD Update lipid panel and make recommendations       Lucie Buttner, PA-C [place]

## 2024-08-24 ENCOUNTER — Other Ambulatory Visit: Payer: Self-pay | Admitting: Physician Assistant

## 2024-08-24 MED ORDER — CONTRAVE 8-90 MG PO TB12: ORAL_TABLET | ORAL | 1 refills | Status: DC

## 2024-08-26 ENCOUNTER — Other Ambulatory Visit (HOSPITAL_COMMUNITY): Payer: Self-pay

## 2024-08-26 NOTE — Telephone Encounter (Signed)
 Pharmacy Patient Advocate Encounter  Received notification from [facility] that Prior Authorization for Contrave  8-90MG  er tablets  has been APPROVED from 08/24/24 to 09/23/24. Ran test claim, Copay is $339.08 for the starter dose. The maintenance dose has also been approved from 09/20/24 to 12/19/24. This test claim was processed through [facility]- copay amounts may vary at other pharmacies due to pharmacy/plan contracts, or as the patient moves through the different stages of their insurance plan.   PA #/Case ID/Reference #: [id number]   *There is a copay card that the patient has to get because she will have to set up an account with them.

## 2024-08-27 NOTE — Telephone Encounter (Signed)
 Pt is aware and Rx was sent to a different pharmacy per [facility].

## 2024-09-03 DIAGNOSIS — Z6830 Body mass index (BMI) 30.0-30.9, adult: Secondary | ICD-10-CM | POA: Diagnosis not present

## 2024-09-03 DIAGNOSIS — Z01419 Encounter for gynecological examination (general) (routine) without abnormal findings: Secondary | ICD-10-CM | POA: Diagnosis not present

## 2024-09-03 DIAGNOSIS — Z1231 Encounter for screening mammogram for malignant neoplasm of breast: Secondary | ICD-10-CM | POA: Diagnosis not present

## 2024-09-03 LAB — HM MAMMOGRAPHY

## 2024-11-17 ENCOUNTER — Other Ambulatory Visit: Payer: Self-pay | Admitting: Physician Assistant

## 2024-11-29 ENCOUNTER — Other Ambulatory Visit: Payer: Self-pay

## 2024-12-24 ENCOUNTER — Other Ambulatory Visit: Payer: Self-pay | Admitting: Physician Assistant

## 2024-12-30 ENCOUNTER — Other Ambulatory Visit: Payer: Self-pay | Admitting: Physician Assistant

## 2024-12-30 NOTE — Telephone Encounter (Signed)
 Copied from CRM [id number]. Topic: Clinical - Medication Refill >> Dec 30, 2024  9:51 AM Alexandria E wrote: Medication: Naltrexone -buPROPion  HCl ER (CONTRAVE ) 8-90 MG TB12  *Patient wanting this today if possible.  Has the patient contacted their pharmacy? Yes (Agent: If no, request that the patient contact the pharmacy for the refill. If patient does not wish to contact the pharmacy document the reason why and proceed with request.) (Agent: If yes, when and what did the pharmacy advise?)  This is the patient's preferred pharmacy:    [facility], [place] - [address] [place] [address] Phone: [phone] Fax: [phone]  Is this the correct pharmacy for this prescription? Yes If no, delete pharmacy and type the correct one.   Has the prescription been filled recently? No  Is the patient out of the medication? No, 3 days left.  Has the patient been seen for an appointment in the last year OR does the patient have an upcoming appointment? Yes  Can we respond through MyChart? Yes  Agent: Please be advised that Rx refills may take up to 3 business days. We ask that you follow-up with your pharmacy.

## 2025-01-07 ENCOUNTER — Other Ambulatory Visit: Payer: Self-pay

## 2025-01-07 ENCOUNTER — Encounter: Payer: Self-pay | Admitting: Physician Assistant

## 2025-01-07 ENCOUNTER — Ambulatory Visit: Admitting: Physician Assistant

## 2025-01-07 ENCOUNTER — Encounter (HOSPITAL_COMMUNITY): Payer: Self-pay

## 2025-01-07 ENCOUNTER — Other Ambulatory Visit (HOSPITAL_COMMUNITY): Payer: Self-pay

## 2025-01-07 VITALS — BP 128/80 | HR 72 | Temp 98.1°F | Ht 65.0 in | Wt 180.4 lb

## 2025-01-07 DIAGNOSIS — E669 Obesity, unspecified: Secondary | ICD-10-CM

## 2025-01-07 DIAGNOSIS — L659 Nonscarring hair loss, unspecified: Secondary | ICD-10-CM | POA: Diagnosis not present

## 2025-01-07 MED ORDER — WEGOVY 1.5 MG PO TABS
1.5000 mg | ORAL_TABLET | Freq: Every day | ORAL | 2 refills | Status: AC
  Filled 2025-01-07: qty 30, 30d supply, fill #0

## 2025-01-07 MED ORDER — MINOXIDIL 2.5 MG PO TABS
1.2500 mg | ORAL_TABLET | Freq: Every day | ORAL | 3 refills | Status: AC
  Filled 2025-01-07: qty 45, 90d supply, fill #0

## 2025-01-07 NOTE — Progress Notes (Signed)
 "  History of Present Illness:   Chief Complaint  Patient presents with   Weight Management Screening    Pt here to discuss medication Contrave  does not feel like it is working.    Discussed the use of AI scribe software for clinical note transcription with the patient, who gave verbal consent to proceed.  History of Present Illness   Marissa Gonzalez is a 49 year old female who presents for a follow-up regarding weight management and medication review.  She trialed Contrave  for weight loss with minimal benefit and transient jitteriness, and often missed the evening dose. She has returned to her regular Wellbutrin  150 mg dose and continues citalopram  20 mg daily unchanged. She follows a balanced diet, limits bread and fried foods, increases protein to at least 50 g per day, and exercises regularly, and is interested in alternative options for weight management.  She takes oral minoxidil  2.5 mg, splitting the tablet in half, for hair regrowth and plans to continue for another 6 to 12 months. She denies dizziness or symptoms of hypotension.  She denies suicidal thoughts and describes her anxiety as normal related to parenting stress.        Past Medical History:  Diagnosis Date   Abnormal Pap smear    Anxiety    Fibroid 2012   this pregnancy   GERD (gastroesophageal reflux disease)    pregnancy related   Headache(784.0)    Heart murmur    no problems   Hx of migraines    as teenager   Lactose intolerance    Sciatic nerve pain 2012   this pregnancy     Social History[1]  Past Surgical History:  Procedure Laterality Date   BREAST REDUCTION SURGERY Bilateral 09/23/2023   Procedure: MAMMARY REDUCTION  (BREAST);  Surgeon: Waddell Leonce NOVAK, MD;  Location: [facility];  Service: Plastics;  Laterality: Bilateral;   CESAREAN SECTION  03/10/2012   Procedure: CESAREAN SECTION;  Surgeon: Rosaline LITTIE Cobble, MD;x 1  Location: [facility];  Service: Gynecology;   Laterality: N/A;   CESAREAN SECTION  08/08/2015   CESAREAN SECTION WITH BILATERAL TUBAL LIGATION Bilateral 08/08/2015   Procedure: CESAREAN SECTION WITH Left Partial Salpingectomy, and Right Ligation of Fallopian tube with Filshie Clip.;  Surgeon: Truman Corona, MD;  Location: [facility];  Service: Obstetrics;  Laterality: Bilateral;  edc 08/12/15 [name] RNFA confirmed 7/29...tms    dental implant  1995   TUBAL LIGATION  08/08/2015   WISDOM TOOTH EXTRACTION  1985    Family History  Problem Relation Age of Onset   Depression Mother        per prenatal   Hypertension Mother    Depression Father        per prenatal   Hearing loss Father    Hypertension Father    Hyperlipidemia Father    Prostate cancer Father    Depression Brother    Arthritis Maternal Grandmother    Miscarriages / Stillbirths Maternal Grandmother    Stroke Maternal Grandmother    Dementia Maternal Grandmother    Heart attack Maternal Grandfather    Heart disease Maternal Grandfather    Hypertension Maternal Grandfather    Breast cancer Paternal Grandmother        x2   Heart attack Paternal Grandfather    Heart disease Paternal Grandfather    Colon cancer Neg Hx    Colon polyps Neg Hx    Esophageal cancer Neg Hx    Rectal cancer  Neg Hx    Stomach cancer Neg Hx     Allergies[2]  Current Medications:  Current Medications[3]   Review of Systems:   Negative unless otherwise specified per HPI.  Vitals:   Vitals:   01/07/25 1116  BP: 128/80  Pulse: 72  Temp: 98.1 F (36.7 C)  TempSrc: Temporal  SpO2: 99%  Weight: 180 lb 6.1 oz (81.8 kg)  Height: 5' 5 (1.651 m)     Body mass index is 30.02 kg/m.  Physical Exam:   Physical Exam Constitutional:      Appearance: Normal appearance. She is well-developed.  HENT:     Head: Normocephalic and atraumatic.  Eyes:     General: Lids are normal.     Extraocular Movements: Extraocular movements intact.     Conjunctiva/sclera: Conjunctivae normal.   Pulmonary:     Effort: Pulmonary effort is normal.  Musculoskeletal:        General: Normal range of motion.     Cervical back: Normal range of motion and neck supple.  Skin:    General: Skin is warm and dry.  Neurological:     Mental Status: She is alert and oriented to person, place, and time.  Psychiatric:        Attention and Perception: Attention and perception normal.        Mood and Affect: Mood normal.        Behavior: Behavior normal.        Thought Content: Thought content normal.        Judgment: Judgment normal.     Assessment and Plan:   Assessment and Plan    Obesity, unspecified class, unspecified obesity type, unspecified whether serious comorbidity present  Wellbutrin  was ineffective. Discussed GLP-1 agonists, side effects, and gradual dose escalation. Explained oral Wegovy  use, cost, and insurance. Emphasized exercise importance. - Prescribed oral Wegovy , starting at the lowest dose. - Instructed to take Wegovy  with four ounces or less of water in the morning and wait 30 minutes before other medications. - Advised to monitor weight and side effects, report concerns via MyChart. - Scheduled follow-up in three months to assess progress and adjust treatment.  Hair thinning  Significant improvement with minoxidil . No adverse effects.  - Continue minoxidil  1.25 mg daily at current dose.      Lucie Buttner, PA-C     [1]  Social History Tobacco Use   Smoking status: Former    Current packs/day: 0.00    Types: Cigarettes    Quit date: 11/13/1999    Years since quitting: 25.1   Smokeless tobacco: Never  Vaping Use   Vaping status: Never Used  Substance Use Topics   Alcohol use: Yes    Comment: very rare alcohol   Drug use: Yes    Types: Marijuana    Comment: couple times a month  [2]  Allergies Allergen Reactions   Augmentin [Amoxicillin-Pot Clavulanate]    Fluticasone  Other (See Comments)   Sulfa Antibiotics    Sulfamethoxazole-Trimethoprim      Other Reaction(s): Not available  [3]  Current Outpatient Medications:    buPROPion  (WELLBUTRIN  XL) 150 MG 24 hr tablet, Take 1 tablet (150 mg total) by mouth daily., Disp: 90 tablet, Rfl: 1   cetirizine (ZYRTEC) 10 MG tablet, Take 10 mg by mouth daily., Disp: , Rfl:    citalopram  (CELEXA ) 20 MG tablet, Take 1 tablet (20 mg total) by mouth daily., Disp: 90 tablet, Rfl: 1   levonorgestrel  (MIRENA , 52 MG,) 20 MCG/DAY IUD,  1 each by Intrauterine route., Disp: , Rfl:    LORazepam  (ATIVAN ) 0.5 MG tablet, Take 1 tablet (0.5 mg total) by mouth as needed for anxiety., Disp: 30 tablet, Rfl: 1   semaglutide -weight management (WEGOVY ) 1.5 MG tablet, Take 1 tablet (1.5 mg total) by mouth daily. Daily in AM on an empty stomach with 4 oz of water. Do not eat or drink for 30 minutes after dose., Disp: 30 tablet, Rfl: 2   minoxidil  (LONITEN ) 2.5 MG tablet, Take 0.5 tablets (1.25 mg total) by mouth daily. Take 1.25mg  by mouth daily., Disp: 45 tablet, Rfl: 3  "

## 2025-01-07 NOTE — Patient Instructions (Signed)
 Wt Readings from Last 3 Encounters:  01/07/25 180 lb 6.1 oz (81.8 kg)  08/20/24 184 lb 8 oz (83.7 kg)  12/12/23 180 lb (81.6 kg)

## 2025-01-08 ENCOUNTER — Other Ambulatory Visit (HOSPITAL_COMMUNITY): Payer: Self-pay

## 2025-01-11 ENCOUNTER — Other Ambulatory Visit (HOSPITAL_COMMUNITY): Payer: Self-pay

## 2025-08-26 ENCOUNTER — Encounter: Admitting: Physician Assistant
